# Patient Record
Sex: Female | Born: 1935
Health system: Southern US, Community
[De-identification: ages and names within clinical notes are randomized; demographics above are authoritative.]

## PROBLEM LIST (undated history)

## (undated) DIAGNOSIS — Z9889 Other specified postprocedural states: Secondary | ICD-10-CM

## (undated) DIAGNOSIS — T8859XA Other complications of anesthesia, initial encounter: Secondary | ICD-10-CM

## (undated) DIAGNOSIS — K649 Unspecified hemorrhoids: Secondary | ICD-10-CM

## (undated) DIAGNOSIS — D126 Benign neoplasm of colon, unspecified: Secondary | ICD-10-CM

## (undated) DIAGNOSIS — M353 Polymyalgia rheumatica: Secondary | ICD-10-CM

## (undated) DIAGNOSIS — N3020 Other chronic cystitis without hematuria: Secondary | ICD-10-CM

## (undated) DIAGNOSIS — R112 Nausea with vomiting, unspecified: Secondary | ICD-10-CM

## (undated) DIAGNOSIS — E7800 Pure hypercholesterolemia, unspecified: Secondary | ICD-10-CM

## (undated) DIAGNOSIS — K219 Gastro-esophageal reflux disease without esophagitis: Secondary | ICD-10-CM

## (undated) DIAGNOSIS — T4145XA Adverse effect of unspecified anesthetic, initial encounter: Secondary | ICD-10-CM

## (undated) DIAGNOSIS — C801 Malignant (primary) neoplasm, unspecified: Secondary | ICD-10-CM

## (undated) HISTORY — PX: CHOLECYSTECTOMY: SHX55

## (undated) HISTORY — PX: ABDOMINAL HYSTERECTOMY: SHX81

## (undated) HISTORY — DX: Pure hypercholesterolemia, unspecified: E78.00

## (undated) HISTORY — DX: Benign neoplasm of colon, unspecified: D12.6

## (undated) HISTORY — DX: Other specified postprocedural states: Z98.890

## (undated) HISTORY — PX: WRIST FRACTURE SURGERY: SHX121

## (undated) HISTORY — DX: Gastro-esophageal reflux disease without esophagitis: K21.9

## (undated) HISTORY — DX: Unspecified hemorrhoids: K64.9

---

## 2001-06-24 ENCOUNTER — Emergency Department (HOSPITAL_COMMUNITY): Admission: EM | Admit: 2001-06-24 | Discharge: 2001-06-24 | Payer: Self-pay | Admitting: Emergency Medicine

## 2001-10-17 ENCOUNTER — Encounter: Payer: Self-pay | Admitting: Family Medicine

## 2001-10-17 ENCOUNTER — Ambulatory Visit (HOSPITAL_COMMUNITY): Admission: RE | Admit: 2001-10-17 | Discharge: 2001-10-17 | Payer: Self-pay | Admitting: Family Medicine

## 2001-10-21 ENCOUNTER — Encounter: Payer: Self-pay | Admitting: Family Medicine

## 2001-10-21 ENCOUNTER — Ambulatory Visit (HOSPITAL_COMMUNITY): Admission: RE | Admit: 2001-10-21 | Discharge: 2001-10-21 | Payer: Self-pay | Admitting: Family Medicine

## 2002-04-24 ENCOUNTER — Ambulatory Visit (HOSPITAL_COMMUNITY): Admission: RE | Admit: 2002-04-24 | Discharge: 2002-04-24 | Payer: Self-pay | Admitting: Family Medicine

## 2002-04-24 ENCOUNTER — Encounter: Payer: Self-pay | Admitting: Family Medicine

## 2002-11-12 ENCOUNTER — Encounter: Payer: Self-pay | Admitting: Family Medicine

## 2002-11-12 ENCOUNTER — Ambulatory Visit (HOSPITAL_COMMUNITY): Admission: RE | Admit: 2002-11-12 | Discharge: 2002-11-12 | Payer: Self-pay | Admitting: Family Medicine

## 2003-11-22 ENCOUNTER — Ambulatory Visit (HOSPITAL_COMMUNITY): Admission: RE | Admit: 2003-11-22 | Discharge: 2003-11-22 | Payer: Self-pay | Admitting: Family Medicine

## 2004-06-14 ENCOUNTER — Ambulatory Visit (HOSPITAL_COMMUNITY): Admission: RE | Admit: 2004-06-14 | Discharge: 2004-06-14 | Payer: Self-pay | Admitting: Internal Medicine

## 2004-06-14 ENCOUNTER — Ambulatory Visit: Payer: Self-pay | Admitting: Internal Medicine

## 2004-06-14 DIAGNOSIS — Z9889 Other specified postprocedural states: Secondary | ICD-10-CM

## 2004-06-14 HISTORY — PX: COLONOSCOPY: SHX174

## 2004-06-14 HISTORY — DX: Other specified postprocedural states: Z98.890

## 2004-10-26 ENCOUNTER — Ambulatory Visit (HOSPITAL_COMMUNITY): Admission: RE | Admit: 2004-10-26 | Discharge: 2004-10-26 | Payer: Self-pay | Admitting: Family Medicine

## 2006-06-18 ENCOUNTER — Ambulatory Visit (HOSPITAL_COMMUNITY): Admission: RE | Admit: 2006-06-18 | Discharge: 2006-06-18 | Payer: Self-pay | Admitting: Family Medicine

## 2006-06-25 ENCOUNTER — Ambulatory Visit (HOSPITAL_COMMUNITY): Admission: RE | Admit: 2006-06-25 | Discharge: 2006-06-25 | Payer: Self-pay | Admitting: Family Medicine

## 2006-07-22 ENCOUNTER — Ambulatory Visit: Payer: Self-pay | Admitting: Internal Medicine

## 2006-08-13 ENCOUNTER — Ambulatory Visit (HOSPITAL_COMMUNITY): Admission: RE | Admit: 2006-08-13 | Discharge: 2006-08-13 | Payer: Self-pay | Admitting: Internal Medicine

## 2006-08-13 ENCOUNTER — Encounter (INDEPENDENT_AMBULATORY_CARE_PROVIDER_SITE_OTHER): Payer: Self-pay | Admitting: *Deleted

## 2006-08-13 ENCOUNTER — Ambulatory Visit: Payer: Self-pay | Admitting: Internal Medicine

## 2006-08-13 DIAGNOSIS — Z9889 Other specified postprocedural states: Secondary | ICD-10-CM

## 2006-08-13 HISTORY — DX: Other specified postprocedural states: Z98.890

## 2006-08-13 HISTORY — PX: ESOPHAGOGASTRODUODENOSCOPY: SHX1529

## 2006-09-12 ENCOUNTER — Ambulatory Visit: Payer: Self-pay | Admitting: Internal Medicine

## 2007-07-15 ENCOUNTER — Ambulatory Visit (HOSPITAL_COMMUNITY): Admission: RE | Admit: 2007-07-15 | Discharge: 2007-07-15 | Payer: Self-pay | Admitting: Internal Medicine

## 2007-09-17 ENCOUNTER — Ambulatory Visit: Payer: Self-pay | Admitting: Internal Medicine

## 2008-09-15 ENCOUNTER — Ambulatory Visit (HOSPITAL_COMMUNITY): Admission: RE | Admit: 2008-09-15 | Discharge: 2008-09-15 | Payer: Self-pay | Admitting: Internal Medicine

## 2008-10-28 ENCOUNTER — Encounter (HOSPITAL_COMMUNITY): Admission: RE | Admit: 2008-10-28 | Discharge: 2008-11-27 | Payer: Self-pay | Admitting: Internal Medicine

## 2008-10-28 ENCOUNTER — Ambulatory Visit (HOSPITAL_COMMUNITY): Payer: Self-pay | Admitting: Internal Medicine

## 2009-09-20 ENCOUNTER — Ambulatory Visit (HOSPITAL_COMMUNITY): Admission: RE | Admit: 2009-09-20 | Discharge: 2009-09-20 | Payer: Self-pay | Admitting: Internal Medicine

## 2010-09-19 NOTE — Assessment & Plan Note (Signed)
NAME:  Jeanette, Dougherty                  CHART#:  [id number]   DATE:  09/17/2007                       DOB:  07/23/1934   PRIMARY CARE PHYSICIAN:  Madelin Rear. Sherwood Gambler, MD   CHIEF COMPLAINT:  Annual follow-up/GERD.   PROBLEM LIST:  1. Gastroesophageal reflux disease.  Esophagogastroduodenoscopy with      biopsy on August 13, 2006, by Dr. Jena Gauss:  Pale 1-3-mm nodules      consistent with benign squamous papilloma and a tiny hiatal hernia.  2. Hypercholesterolemia.  3. Status post cholecystectomy.  4. Status post hysterectomy.  5. Diagnostic colonoscopy June 14, 2004:  Friable internal and      external hemorrhoids, left-sided diverticula.  6. CT of abdomen and pelvis with contrast on June 25, 2006:      Hepatic and right renal cystic disease, wall thickening of the      distal gastric antrum into the pylorus, not confirmed on      esophagogastroduodenoscopy.  Sigmoid diverticulosis.   SUBJECTIVE:  [name] reports for follow-up for chronic GERD.  [name] has  previously been on omeprazole 20 mg daily, is now down to 20 mg every  other day and doing quite well.  [name] rarely has heartburn and  indigestion.  [name] denies any anorexia or abdominal pain.  [name]  occasionally may have chronic constipation but has increased the fiber  in her diet and exercises regularly and feels that this is not a problem  at the time.  [name] denies any anorexia or early satiety, dysphagia or  odynophagia.  [name] did have one episode of nausea right after exercising.  [name] became kind of swimmy-headed and diaphoretic.  [name] checked her blood  pressure, which [name] tells me was okay.  This happened last week after  lunch.  [name] has not been seen by her primary care Hebah Bogosian regarding  this.   CURRENT MEDICATIONS:  See the list from Sep 17, 2007.   ALLERGIES:  No known drug allergies.   OBJECTIVE:  VITAL SIGNS:  Weight 167  pounds.  Height 69 inches.  Temperature 98.1, blood pressure 140/80, and pulse 64.  GENERAL:  [name]  is a well-developed, well-nourished, elderly female in no  acute distress.  HEENT:  Sclerae are clear, nonicteric.  Her conjunctivae are pink.  Oropharynx pink and moist without any lesions.  CHEST:  Heart regular rate and rhythm, normal S1 and S2, without  murmurs, rubs, clicks, thrills or gallops.  ABDOMEN:  Positive bowel sounds x4.  No bruits are auscultated.  Soft,  nontender, nondistended, without palpable mass or hepatosplenomegaly.  No rebound tenderness or guarding.  EXTREMITIES:  Without clubbing or edema.   ASSESSMENT:  1. Chronic gastroesophageal reflux disease,well-controlled.  [name] may      be able to use p.r.n. H2 blocker at this point.  2. Single episode of nausea, diaphoresis and dizziness post exercise.      I feel this may be due to hypoglycemia; however, I have asked her      to contact Dr. Sharyon Medicus office right away.  [name] just had a recent      physical exam and will defer any further workup to him.  [name] agrees      with this plan.   PLAN:  1. Colonoscopy 2016.  2. Follow up on a p.r.n.  basis.  3. H2 blockers such as Zantac or Pepcid AC as needed for GERD      symptoms.  If [name] is significant amount of symptoms while on H2      blockers, [name] can resume omeprazole 20 mg daily.  4. [name] will contact Dr. Sherwood Gambler regarding her exercise-induced episode      of diaphoresis, nausea, and dizziness.       Lorenza Burton, N.P.  Electronically Signed     R. Roetta Sessions, M.D.  Electronically Signed    KJ/MEDQ  D:  09/18/2007  T:  09/18/2007  Job:  [id number]   cc:   Madelin Rear. Sherwood Gambler, MD

## 2010-09-19 NOTE — Assessment & Plan Note (Signed)
NAME:  [name], [name]                  CHART#:  [id number]   DATE:  09/12/2006                       DOB:  09-14-1934   Followup right upper quadrant abdominal pain.  EGD August 13, 2006  demonstrated pale esophageal nodules consistent with squamous papilloma,  tiny hiatal hernia, otherwise normal upper GI tract status post  cholecystectomy.  A one-month course of Aciphex 20 mg orally daily has  pretty much abolished her abdominal symptoms.  She is very pleased, she  has asked about anti-reflux measures/dietary control and we talked about  the impact of having a small hiatal hernia (which is minimal).   CURRENT MEDICATIONS:  See updated list.   ALLERGIES:  NO KNOWN DRUG ALLERGIES.   EXAMINATION:  She appears well, weight 169, height 5 foot 9, temperature  98, blood pressure 138/90, pulse 62.  SKIN:  Warm and dry.  CHEST:  Lungs are clear to auscultation.  CARDIO:  Regular rate and rhythm without murmur, gallop, or rub.  ABDOMEN:  Nondistended, positive bowel sounds, soft, nontender, no mass  or splenomegaly.   ASSESSMENT:  Upper abdominal pain, most likely secondary to an element  of gastroesophageal reflux disease/dyspepsia now abolished with acid  suppression therapy (she did have some thickening of the gastric wall on  a prior CT, this was not bourne out on esophagogastroduodenoscopy).  She  is really doing all the right things.  She is exercising, she sees a  Scientific laboratory technician and a Land in [place].  I recommended that she just try  to lose 10 pounds over the remainder of this year and provided her  literature on anti-reflux measures/diet and if she could drop 10 pounds  she could probably decrease Aciphex to every other day and if she cannot  tell the difference with that change then she can probably get along  with p.r.n. use of H2 blockers.  Unless something comes up, plan to see  this nice lady back in one year.       Jonathon Bellows, M.D.  Electronically Signed     RMR/MEDQ  D:  09/12/2006  T:  09/12/2006  Job:  [id number]   cc:   [facility]

## 2010-09-22 ENCOUNTER — Other Ambulatory Visit (HOSPITAL_COMMUNITY): Payer: Self-pay | Admitting: Internal Medicine

## 2010-09-22 DIAGNOSIS — Z139 Encounter for screening, unspecified: Secondary | ICD-10-CM

## 2010-09-22 NOTE — Op Note (Signed)
NAME:  [name], [name]                 ACCOUNT NO.:  [id number]   MEDICAL RECORD NO.:  [id number]          PATIENT TYPE:  AMB   LOCATION:  DAY                           FACILITY:  [facility]   PHYSICIAN:  R. Roetta Sessions, M.D. DATE OF BIRTH:  06-11-1934   DATE OF PROCEDURE:  06/14/2004  DATE OF DISCHARGE:                                 OPERATIVE REPORT   PROCEDURE:  Diagnostic colonoscopy.   INDICATIONS FOR PROCEDURE:  The patient is 76 year old lady with  intermittent hematochezia and constipation which is intermittent as well. No  family history of colorectal neoplasm. She tells me she had a colonoscopy  several years ago without significant findings. Colonoscopy is now being  done. The approach has been discussed with the patient. Potential risks,  benefits, and alternatives have been reviewed. Questions answered. She gets  nauseated with conscious sedation; therefore, we gave her Zofran prior to  the procedure.   PROCEDURE:  O2 saturation, blood pressure, pulse, and respirations were  monitored throughout the entire procedure. Conscious sedation with Versed 4  mg IV and Demerol 75 mg IV in divided doses. Zofran 5 mg IV.   INSTRUMENT:  Olympus video chip system.   FINDINGS:  Digital rectal examination revealed some external hemorrhoids  only.   ENDOSCOPIC FINDINGS:  Prep was adequate.   Rectum:  Examination of the rectal mucosa including retroflexed view of anal  verge revealed only some friable internal hemorrhoids.   Colon:  Colonic mucosa was surveyed from the rectosigmoid junction through  the left, transverse, and right colon to area of the appendiceal orifice,  ileocecal valve, and cecum. These structures were well seen and photographed  for the record. Olympus video scope was slowly withdrawn, and all previously  mentioned mucosal surfaces were again seen. The patient had sigmoid  diverticulum. Remainder of colonic mucosa appeared normal. The patient  tolerated the  procedure well and was reactive to endoscopy.   IMPRESSION:  1.  External and internal hemorrhoids, friable. Otherwise normal rectum.  2.  Sigmoid diverticulum. Remainder of colonic mucosa appeared normal.   RECOMMENDATIONS:  1.  Diverticulosis and hemorrhoid literature provided to Ms. Wallen.  2.  She is to begin a fiber supplement with Metamucil, Citrucel, and      Benefiber.  3.  Ten-day course of Anusol HC suppositories, one per rectum at bedtime.  4.  As far as colorectal cancer screening is concerned, she is to have      another colonoscopy in 10 years.      RMR/MEDQ  D:  06/14/2004  T:  06/14/2004  Job:  [id number]   cc:   Madelin Rear. Sherwood Gambler, MD  [address]  [place]  [place] [address]  Fax: [phone]

## 2010-09-22 NOTE — H&P (Signed)
NAME:  [name], [name]                 ACCOUNT NO.:  [id number]   MEDICAL RECORD NO.:  [id number]          PATIENT TYPE:  AMB   LOCATION:  DAY                           FACILITY:  [facility]   PHYSICIAN:  R. Roetta Sessions, M.D. DATE OF BIRTH:  03/23/1935   DATE OF ADMISSION:  DATE OF DISCHARGE:  LH                              HISTORY & PHYSICAL   REASON FOR CONSULTATION:  Abnormal stomach:  CT.   [name] is a pleasant, 76 year old lady referred through the  courtesy of Dr. Yetta Numbers for further evaluation of abnormal stomach:  CT. Apparently [name] has been having some intermittent right lower  quadrant abdominal pain which she attributes to constipation. Evaluation  at [facility] included a CT of the abdomen and pelvis which  demonstrated question of wall thickening of the distal gastric antrum,  question of inflammatory process versus infiltrating process. EGD was  recommended. Aside from very rare episodes of heartburn, [name]  really does not have any upper GI tract symptoms such as early satiety,  nausea, vomiting upper abdominal pain, odynophagia or dysphagia. She has  had no melena or hematochezia.  Her weight has fluctuated over the year.  She tells me that a year or so ago she weighed as little as 148 but  currently weighs 166.  There is no history of any GI neoplasia. It is  good to know that she had a colonoscopy for intermittent hematochezia  back in June 14, 2004 which demonstrated external and internal  hemorrhoids, otherwise aside from sigmoid diverticula, the rectum and  colon appeared normal.  She does take Fiber One cereal daily but  continues to have a difficult time moving her bowels. She has never had  her upper GI tract evaluated.   PAST MEDICAL HISTORY:  Osteopenia.   PAST SURGERIES:  Cholecystectomy, hysterectomy.   CURRENT MEDICATIONS:  Boniva 1 tablet monthly, multivitamin daily,  glucosamine chondroitin daily, flax seed oil daily.   ALLERGIES:  No known drug allergies.   FAMILY HISTORY:  Mother has a history of thyroid cancer and died with  this at age 56.  Father died in an accident at age 66, otherwise no  history of chronic GI or liver illness.   SOCIAL HISTORY:  The patient is widowed.  She has two sons, she is  retired from [employer] systems [employer] on [address]. She occasionally  consumes a glass of wine. She does not use any tobacco.   REVIEW OF SYSTEMS:  No chest pain, no dyspnea on exertion. Weight gain  is outlined above. Otherwise as in history of present illness.   PHYSICAL EXAMINATION:  GENERAL:  Revealed a pleasant, 76 year old lady  resting comfortably.  VITAL SIGNS:  Weight 166, height 5 feet 8, temperature 97.9, BP 120/70,  pulse 72.  SKIN:  Warm and dry.  HEENT:  No scleral icterus. JVD is not prominent.  CHEST:  Lungs are clear to auscultation.  CARDIAC:  Regular rate and rhythm without murmur, gallop or rub.  BREAST:  Exam is deferred.  ABDOMEN:  Nondistended.  Positive bowel  sounds, soft, minimal right  lower quadrant tenderness to palpation.  No appreciable mass or  organomegaly.  EXTREMITIES:  No edema.   IMPRESSION:  [name] is a pleasant, 76 year old lady with an  abnormal  appearing stomach on CT. She has rare frequent episodes of  reflux,  otherwise she is devoid of any upper GI tract symptoms.  She  have occasional right lower quadrant abdominal pain in the setting of  constipation.   I do not detect any alarm symptoms. More often than not, the report of a  thickened gastric antrum ends up being an artifact.  However, I agree  with the radiologist that we need to directly visualize her stomach  before any conclusions can be drawn.   RECOMMENDATIONS:  I have offered [name] a diagnostic EGD in the very  near future.  The potential risks, benefits and alternatives have been  reviewed. Depending on what is found will make further recommendations  in regards to the  management of her constipation.   I would like to thank Dr. Yetta Numbers for allowing me to see this nice  lady once again.      Jonathon Bellows, M.D.  Electronically Signed     RMR/MEDQ  D:  07/22/2006  T:  07/23/2006  Job:  [id number]   cc:   Kirk Ruths, M.D.  Fax: [phone]

## 2010-09-22 NOTE — Op Note (Signed)
NAME:  [name], WROBLESKI                 ACCOUNT NO.:  [id number]   MEDICAL RECORD NO.:  [id number]          PATIENT TYPE:  AMB   LOCATION:  DAY                           FACILITY:  [facility]   PHYSICIAN:  R. Roetta Sessions, M.D. DATE OF BIRTH:  03/16/1935   DATE OF PROCEDURE:  08/13/2006  DATE OF DISCHARGE:                               OPERATIVE REPORT   PROCEDURE:  Esophagogastroduodenoscopy with biopsy.   INDICATIONS FOR PROCEDURE:  Patient is 76 year old lady with some vague  intermittent epigastric right upper quadrant abdominal pain which she  attributes to constipation.  She had a CT scan which demonstrated some  questionable wall thickening of the stomach.  EGD has been recommended.  She really does not have much in the way of typical heartburn symptoms.  She does take Boniva.  She is not on any acid suppression therapy.  Gallbladder is out.  EGD now being done.  This approach has been  discussed with the patient at length.  Potential risks, benefits and  alternatives have been reviewed, questions answered.  She is agreeable.  Please see documentation in the medical record.   PROCEDURE NOTE:  O2 saturation, blood pressure, pulse respirations  monitored throughout the entire procedure.   CONSCIOUS SEDATION:  Versed 3 mg IV, Demerol 75 mg IV in divided doses.  Cetacaine spray topical pharyngeal anesthesia.   INSTRUMENT:  Pentax video chip system.   FINDINGS:  Examination of esophagus revealed numerous 1-3 mm pale raised  nodules consistent with squamous papillomas, otherwise esophageal mucosa  appeared normal.  EG junction easily traversed.  Stomach:  Gastric cavity was empty.  It insufflated well with air.  Thorough examination of gastric mucosa including retroflex view of the  proximal stomach and esophagogastric junction was undertaken.  The  patient a tiny hiatal hernia.  The stomach insufflated very nicely, the  mucosa was well seen and appeared normal.  Pylorus patent, easily  traversed.  Examination of bulb, second portion revealed no  abnormalities.   THERAPEUTIC/DIAGNOSTIC MANEUVERS PERFORMED:  One of the pale nodules in  the esophagus was biopsied for histologic study.  The patient tolerated  procedure well.   IMPRESSION:  1. 1-3 mm pale nodules, numerous, consistent with benign squamous      papilloma, one biopsied, otherwise normal esophagus.  2. Tiny hiatal hernia, otherwise normal stomach, D1, D2.   RECOMMENDATIONS:  50-month course of AcipHex 20 mg orally daily.  Follow-  up appointment with [place] in the office in one month to how she is doing.  Follow-up on path.  For further recommendations to follow.      Jonathon Bellows, M.D.  Electronically Signed     RMR/MEDQ  D:  08/13/2006  T:  08/13/2006  Job:  [id number]   cc:   Kirk Ruths, M.D.  Fax: [phone]

## 2010-09-26 ENCOUNTER — Ambulatory Visit (HOSPITAL_COMMUNITY)
Admission: RE | Admit: 2010-09-26 | Discharge: 2010-09-26 | Disposition: A | Discharge status: Home / Self Care | Payer: Medicare Other | Source: Ambulatory Visit | Attending: Internal Medicine | Admitting: Internal Medicine

## 2010-09-26 DIAGNOSIS — Z139 Encounter for screening, unspecified: Secondary | ICD-10-CM

## 2010-09-26 DIAGNOSIS — Z1231 Encounter for screening mammogram for malignant neoplasm of breast: Secondary | ICD-10-CM | POA: Insufficient documentation

## 2010-10-18 ENCOUNTER — Other Ambulatory Visit (HOSPITAL_COMMUNITY): Payer: Self-pay | Admitting: Internal Medicine

## 2010-10-18 DIAGNOSIS — Z139 Encounter for screening, unspecified: Secondary | ICD-10-CM

## 2010-10-23 ENCOUNTER — Ambulatory Visit (HOSPITAL_COMMUNITY)
Admission: RE | Admit: 2010-10-23 | Discharge: 2010-10-23 | Disposition: A | Discharge status: Home / Self Care | Payer: Medicare Other | Source: Ambulatory Visit | Attending: Internal Medicine | Admitting: Internal Medicine

## 2010-10-23 DIAGNOSIS — Z780 Asymptomatic menopausal state: Secondary | ICD-10-CM | POA: Insufficient documentation

## 2010-10-23 DIAGNOSIS — Z139 Encounter for screening, unspecified: Secondary | ICD-10-CM

## 2010-10-23 DIAGNOSIS — M818 Other osteoporosis without current pathological fracture: Secondary | ICD-10-CM | POA: Insufficient documentation

## 2010-10-30 ENCOUNTER — Encounter: Payer: Self-pay | Admitting: Gastroenterology

## 2010-10-30 ENCOUNTER — Ambulatory Visit (INDEPENDENT_AMBULATORY_CARE_PROVIDER_SITE_OTHER): Payer: Medicare Other | Admitting: Gastroenterology

## 2010-10-30 VITALS — BP 141/74 | HR 67 | Temp 97.5°F | Ht 67.0 in | Wt 164.4 lb

## 2010-10-30 DIAGNOSIS — R195 Other fecal abnormalities: Secondary | ICD-10-CM

## 2010-10-30 NOTE — Patient Instructions (Signed)
Please have labs completed. We will call you with results.  You may take Miralax every other day as needed for constipation.  Continue a high fiber diet as you are doing!  We have set you up for a colonoscopy; we will discuss further recommendations at that time.

## 2010-10-30 NOTE — Progress Notes (Signed)
Referring Provider: Dwana Melena, MD Primary Care Physician:  Dwana Melena, MD Primary Gastroenterologist:  Dr.  Chief Complaint  Patient presents with  . Rectal Bleeding    heme + card 1 of 3  . Constipation    HPI:  [name] is a 76 y.o. female here as a referral from Dr. Margo Aye for heme + stool.  reports bright red blood per rectum as well, states this is chronic. Hx of internal and external hemorrhoids on colonoscopy in 2006. Has difficulty with constipation, bloating. Feels more bloated than usual. Does report that her right side "sticks out" more than her left side when standing up. Had cholecystectomy in the 1970s, feels scar may be causing it to be different. No abdominal pain. No lack of appetite. No wt loss. No N/V.  Takes a laxative usually on Saturdays. Eating high fiber diet. Labs done with Dr. Margo Aye included: CMP, lipid panel. No significant findings. No CBC on file.   Past Medical History  Diagnosis Date  . GERD (gastroesophageal reflux disease)   . Hypercholesterolemia   . S/P colonoscopy Jun 14, 2004    friable internal and external hemorrhoids, left-sided diverticula  . S/P endoscopy August 13, 2006    pale 1-3 mm nodules consistent with benign squamous papilloma, tiny hiatal hernia    Past Surgical History  Procedure Date  . Cholecystectomy   . Abdominal hysterectomy     Current Outpatient Prescriptions  Medication Sig Dispense Refill  . aspirin 81 MG tablet Take 81 mg by mouth 2 (two) times daily.        . Glucosamine-Chondroit-Vit C-Mn (GLUCOSAMINE 1500 COMPLEX PO) Take by mouth.        . Multiple Vitamin (MULTIVITAMIN) capsule Take 1 capsule by mouth 2 (two) times daily.          Allergies as of 10/30/2010  . (No Known Allergies)    Family History  Problem Relation Age of Onset  . Thyroid cancer Mother   . Colon cancer Neg Hx     History   Social History  . Marital Status: Widowed    Spouse Name: N/A    Number of Children: N/A  . Years of  Education: N/A   Occupational History  . Not on file.   Social History Main Topics  . Smoking status: Never Smoker   . Smokeless tobacco: Not on file  . Alcohol Use: Yes     social  . Drug Use: No  . Sexually Active: Not on file     Review of Systems: Gen: Denies any fever, chills, sweats, anorexia, fatigue, weakness, malaise, weight loss, and sleep disorder CV: Denies chest pain, angina, palpitations, syncope, orthopnea, PND, peripheral edema, and claudication. Resp: Denies dyspnea at rest, dyspnea with exercise, cough, sputum, wheezing, coughing up blood, and pleurisy. GI: Denies vomiting blood, jaundice, and fecal incontinence.   Denies dysphagia or odynophagia. GU : Denies urinary burning, blood in urine, urinary frequency, urinary hesitancy, nocturnal urination, and urinary incontinence. MS: Denies joint pain, limitation of movement, and swelling, stiffness, low back pain, extremity pain. Denies muscle weakness, cramps, atrophy.  Derm: Denies rash, itching, dry skin, hives, moles, warts, or unhealing ulcers.  Psych: Denies depression, anxiety, memory loss, suicidal ideation, hallucinations, paranoia, and confusion. Heme: Denies bruising, bleeding, and enlarged lymph nodes.  Physical Exam: BP 141/74  Pulse 67  Temp(Src) 97.5 F (36.4 C) (Temporal)  Ht 5\' 7"  (1.702 m)  Wt 164 lb 6.4 oz (74.571 kg)  BMI 25.75 kg/m2 General:  Alert,  Well-developed, well-nourished, pleasant and cooperative in NAD Head:  Normocephalic and atraumatic. Eyes:  Sclera clear, no icterus.   Conjunctiva pink. Ears:  Normal auditory acuity. Nose:  No deformity, discharge,  or lesions. Mouth:  No deformity or lesions, dentition normal. Neck:  Supple; no masses or thyromegaly. Lungs:  Clear throughout to auscultation.   No wheezes, crackles, or rhonchi. No acute distress. Heart:  Regular rate and rhythm; no murmurs, clicks, rubs,  or gallops. Abdomen:  Soft, nontender and nondistended. No masses,  hepatosplenomegaly or hernias noted. Normal bowel sounds, without guarding, and without rebound.  Large surgical scar from prior cholecystectomy noted. With pt standing upright, area above surgical scar somewhat more pronounced than left side; this feels like scar tissue. Again, no mass noted. Appears that contour of body is affected by surgical scar. Pt carries most of her weight around the mid-section. Rectal:  Deferred until time of colonoscopy.   Msk:  Symmetrical without gross deformities. Normal posture. Extremities:  Without clubbing or edema. Neurologic:  Alert and  oriented x4;  grossly normal neurologically. Skin:  Intact without significant lesions or rashes. Cervical Nodes:  No significant cervical adenopathy. Psych:  Alert and cooperative. Normal mood and affect.

## 2010-10-30 NOTE — Assessment & Plan Note (Signed)
76 year old pleasant Caucasian female with last colonoscopy in 2006; known internal and external hemorrhoids as well as sigmoid diverticula. Pt reports continued rectal bleeding, as well as reports of heme +stool from Dr. Scharlene Gloss office. More bloating than usual, chronic constipation. She is concerned about the right side of her abdomen, stating it "pooches" out more than the left. This is very mildly pronounced and likely due to scar tissue/scar placement/fat distribution. Denies abdominal pain. Likely hematochezia is due to benign anorectal source; pt does have known hx of hemorrhoids. However, unable to exclude other GI sources; pt with heme + stool via PCP. Will proceed with colonoscopy and updated CBC.  Proceed with TCS with Dr. Jena Gauss in near future: the risks, benefits, and alternatives have been discussed with the patient in detail. The patient states understanding and desires to proceed. CBC Continue high fiber diet Miralax prn

## 2010-10-31 LAB — CBC WITH DIFFERENTIAL/PLATELET
Basophils Absolute: 0 10*3/uL (ref 0.0–0.1)
Basophils Relative: 1 % (ref 0–1)
Eosinophils Absolute: 0.1 10*3/uL (ref 0.0–0.7)
Eosinophils Relative: 2 % (ref 0–5)
HCT: 37 % (ref 36.0–46.0)
Hemoglobin: 12.5 g/dL (ref 12.0–15.0)
Lymphocytes Relative: 39 % (ref 12–46)
Lymphs Abs: 1.5 10*3/uL (ref 0.7–4.0)
MCH: 30.8 pg (ref 26.0–34.0)
MCHC: 33.8 g/dL (ref 30.0–36.0)
MCV: 91.1 fL (ref 78.0–100.0)
Monocytes Absolute: 0.3 10*3/uL (ref 0.1–1.0)
Monocytes Relative: 8 % (ref 3–12)
Neutro Abs: 1.9 10*3/uL (ref 1.7–7.7)
Neutrophils Relative %: 50 % (ref 43–77)
Platelets: 223 10*3/uL (ref 150–400)
RBC: 4.06 MIL/uL (ref 3.87–5.11)
RDW: 13.9 % (ref 11.5–15.5)
WBC: 3.8 10*3/uL — ABNORMAL LOW (ref 4.0–10.5)

## 2010-11-02 ENCOUNTER — Encounter (HOSPITAL_COMMUNITY): Payer: Medicare Other | Attending: Internal Medicine

## 2010-11-02 ENCOUNTER — Encounter: Payer: Self-pay | Admitting: General Practice

## 2010-11-02 DIAGNOSIS — M818 Other osteoporosis without current pathological fracture: Secondary | ICD-10-CM | POA: Insufficient documentation

## 2010-11-03 NOTE — Progress Notes (Signed)
agree

## 2010-11-06 NOTE — Progress Notes (Signed)
Cc to Dr. Hall 

## 2010-11-17 MED ORDER — SODIUM CHLORIDE 0.45 % IV SOLN
Freq: Once | INTRAVENOUS | Status: AC
  Administered 2010-11-20: 11:00:00 via INTRAVENOUS

## 2010-11-20 ENCOUNTER — Encounter: Payer: Medicare Other | Admitting: Internal Medicine

## 2010-11-20 ENCOUNTER — Other Ambulatory Visit: Payer: Self-pay | Admitting: Internal Medicine

## 2010-11-20 ENCOUNTER — Encounter (HOSPITAL_COMMUNITY)
Admission: RE | Disposition: A | Discharge status: Home / Self Care | Payer: Self-pay | Source: Ambulatory Visit | Attending: Internal Medicine

## 2010-11-20 ENCOUNTER — Ambulatory Visit (HOSPITAL_COMMUNITY)
Admission: RE | Admit: 2010-11-20 | Discharge: 2010-11-20 | Disposition: A | Discharge status: Home / Self Care | Payer: Medicare Other | Source: Ambulatory Visit | Attending: Internal Medicine | Admitting: Internal Medicine

## 2010-11-20 ENCOUNTER — Encounter (HOSPITAL_COMMUNITY): Payer: Self-pay | Admitting: *Deleted

## 2010-11-20 DIAGNOSIS — K648 Other hemorrhoids: Secondary | ICD-10-CM | POA: Insufficient documentation

## 2010-11-20 DIAGNOSIS — K921 Melena: Secondary | ICD-10-CM

## 2010-11-20 DIAGNOSIS — K5730 Diverticulosis of large intestine without perforation or abscess without bleeding: Secondary | ICD-10-CM

## 2010-11-20 DIAGNOSIS — E785 Hyperlipidemia, unspecified: Secondary | ICD-10-CM | POA: Insufficient documentation

## 2010-11-20 DIAGNOSIS — K644 Residual hemorrhoidal skin tags: Secondary | ICD-10-CM | POA: Insufficient documentation

## 2010-11-20 DIAGNOSIS — D126 Benign neoplasm of colon, unspecified: Secondary | ICD-10-CM

## 2010-11-20 DIAGNOSIS — Z7982 Long term (current) use of aspirin: Secondary | ICD-10-CM | POA: Insufficient documentation

## 2010-11-20 HISTORY — PX: COLONOSCOPY: SHX5424

## 2010-11-20 SURGERY — COLONOSCOPY
Anesthesia: Moderate Sedation

## 2010-11-20 MED ORDER — MEPERIDINE HCL 100 MG/ML IJ SOLN
INTRAMUSCULAR | Status: AC
  Filled 2010-11-20: qty 1

## 2010-11-20 MED ORDER — STERILE WATER FOR IRRIGATION IR SOLN
Status: DC | PRN
  Administered 2010-11-20: 13:00:00

## 2010-11-20 MED ORDER — MESALAMINE 1000 MG RE SUPP: 1000.0000 mg | Freq: Every day | RECTAL | Status: AC

## 2010-11-20 MED ORDER — MIDAZOLAM HCL 5 MG/5ML IJ SOLN
INTRAMUSCULAR | Status: AC
  Filled 2010-11-20: qty 10

## 2010-11-20 MED ORDER — MIDAZOLAM HCL 5 MG/5ML IJ SOLN
INTRAMUSCULAR | Status: DC | PRN
  Administered 2010-11-20: 1 mg via INTRAVENOUS
  Administered 2010-11-20: 2 mg via INTRAVENOUS
  Administered 2010-11-20: 1 mg via INTRAVENOUS

## 2010-11-20 MED ORDER — MEPERIDINE HCL 100 MG/ML IJ SOLN
INTRAMUSCULAR | Status: DC | PRN
  Administered 2010-11-20: 25 mg via INTRAVENOUS
  Administered 2010-11-20: 50 mg via INTRAVENOUS

## 2010-11-29 ENCOUNTER — Encounter: Payer: Self-pay | Admitting: Internal Medicine

## 2010-11-30 ENCOUNTER — Encounter (HOSPITAL_COMMUNITY): Payer: Self-pay | Admitting: Internal Medicine

## 2011-10-30 ENCOUNTER — Encounter: Payer: Self-pay | Admitting: Internal Medicine

## 2011-10-30 ENCOUNTER — Ambulatory Visit (INDEPENDENT_AMBULATORY_CARE_PROVIDER_SITE_OTHER): Payer: Medicare Other | Admitting: Internal Medicine

## 2011-10-30 VITALS — BP 134/70 | HR 73 | Temp 96.8°F | Ht 67.0 in | Wt 169.8 lb

## 2011-10-30 DIAGNOSIS — K921 Melena: Secondary | ICD-10-CM

## 2011-10-30 NOTE — Patient Instructions (Addendum)
Recommend daily Benefiber 2 tablespoons  Take Miralax 17 grams nightly if no bowel movement on any given day;  FS at the hospital in the near future to evaluate for rectal bleeding

## 2011-10-30 NOTE — Progress Notes (Signed)
Primary Care Physician:  Dwana Melena, MD Primary Gastroenterologist:  Dr. Jena Gauss  Pre-Procedure History & Physical: HPI:  [name] is a 77 y.o. female here for further evaluation of recent hematochezia. States she's been constipated and may go upwards of 4-5 days without a bowel movement and then takes a laxative and has a large bowel movement. Noted 2 episodes of gross blood per rectum with incontinence without any associated abdominal pain. No melena. Hasn't had any bleeding since this episode or prior to it. Did have urinary tract infection for treatment of antibiotics during this time. Prep prior colonoscopy last year was relatively poor. A serrated adenoma was removed from her right colon. History of both external and internal hemorrhoids.  Past Medical History  Diagnosis Date  . GERD (gastroesophageal reflux disease)   . Hypercholesterolemia   . S/P colonoscopy Jun 14, 2004    friable internal and external hemorrhoids, left-sided diverticula  . S/P endoscopy August 13, 2006    pale 1-3 mm nodules consistent with benign squamous papilloma, tiny hiatal hernia  . Hemorrhoids   . Serrated adenoma of colon     Past Surgical History  Procedure Date  . Cholecystectomy   . Abdominal hysterectomy   . Colonoscopy 11/20/2010    Dr. Jena Gauss- serrated adenomaL side diverticulosis, hemorrhoids  . Colonoscopy 06/14/04    friable internal and external hemorrhoids, left-sided diverticula  . Esophagogastroduodenoscopy 08/13/06    pale 1-3 mm nodules consistent with benign squamous papilloma, tiny hiatal hernia    Prior to Admission medications   Medication Sig Start Date End Date Taking? Authorizing Provider  aspirin 81 MG tablet Take 81 mg by mouth daily. Takes two daily   Yes Historical Provider, MD  Multiple Vitamin (MULTIVITAMIN) capsule Take 1 capsule by mouth 2 (two) times daily.     Yes Historical Provider, MD  Glucosamine-Chondroit-Vit C-Mn (GLUCOSAMINE 1500 COMPLEX PO) Take by mouth.       Historical Provider, MD    Allergies as of 10/30/2011  . (No Known Allergies)    Family History  Problem Relation Age of Onset  . Thyroid cancer Mother   . Colon cancer Neg Hx     History   Social History  . Marital Status: Widowed    Spouse Name: N/A    Number of Children: N/A  . Years of Education: N/A   Occupational History  . Not on file.   Social History Main Topics  . Smoking status: Never Smoker   . Smokeless tobacco: Not on file  . Alcohol Use: 1.2 oz/week    2 Glasses of wine per week     Drinks wine 2-3 days per week ( usually a couple of glasses each time)  . Drug Use: No  . Sexually Active: Not on file   Other Topics Concern  . Not on file   Social History Narrative  . No narrative on file    Review of Systems: See HPI, otherwise negative ROS  Physical Exam: BP 134/70  Pulse 73  Temp 96.8 F (36 C) (Tympanic)  Ht 5\' 7"  (1.702 m)  Wt 169 lb 12.8 oz (77.021 kg)  BMI 26.59 kg/m2 General:   Alert,  Well-developed, well-nourished, pleasant and cooperative in NAD Skin:  Intact without significant lesions or rashes. Eyes:  Sclera clear, no icterus.   Conjunctiva pink. Ears:  Normal auditory acuity. Nose:  No deformity, discharge,  or lesions. Mouth:  No deformity or lesions. Neck:  Supple; no masses or thyromegaly. No significant  cervical adenopathy. Lungs:  Clear throughout to auscultation.   No wheezes, crackles, or rhonchi. No acute distress. Heart:  Regular rate and rhythm; no murmurs, clicks, rubs,  or gallops. Abdomen: Non-distended, normal bowel sounds.  Soft and nontender without appreciable mass or hepatosplenomegaly.  Pulses:  Normal pulses noted. Extremities:  Without clubbing or edema. Rectal:  External hemorrhoid tags present. Good sphincter tone. No mass rectal no stool in rectal vault. Mucus Hemoccult-negative.  Impression/Plan:        2 notable episodes of hematochezia recently in the setting of constipation. Almost certainly  related to anorectal origin/hemorrhoids. However, because                   prep was relatively poor previously, I've offered her a sigmoidoscopy just to check the rectum and distal colon and make sure nothing else is going           on.The risks, benefits, limitations, alternatives and imponderables have been reviewed with the patient. Questions have been answered. All parties are           agreeable. I have also recommended she take a fiber supplement such as Benefiber 2 tablespoons every day. In addition, if she has not had a                           reasonably good bowel movement on any given day she should take 17 g of MiraLax at bedtime that night to facilitate bowel function.

## 2011-10-31 ENCOUNTER — Other Ambulatory Visit (HOSPITAL_COMMUNITY): Payer: Self-pay | Admitting: Internal Medicine

## 2011-10-31 DIAGNOSIS — IMO0001 Reserved for inherently not codable concepts without codable children: Secondary | ICD-10-CM

## 2011-11-01 ENCOUNTER — Ambulatory Visit (HOSPITAL_COMMUNITY)
Admission: RE | Admit: 2011-11-01 | Discharge: 2011-11-01 | Disposition: A | Discharge status: Home / Self Care | Payer: Medicare Other | Source: Ambulatory Visit | Attending: Internal Medicine | Admitting: Internal Medicine

## 2011-11-01 DIAGNOSIS — Z1231 Encounter for screening mammogram for malignant neoplasm of breast: Secondary | ICD-10-CM | POA: Insufficient documentation

## 2011-11-01 DIAGNOSIS — IMO0001 Reserved for inherently not codable concepts without codable children: Secondary | ICD-10-CM

## 2011-11-02 ENCOUNTER — Encounter (HOSPITAL_COMMUNITY): Payer: Self-pay | Admitting: Pharmacy Technician

## 2011-11-15 ENCOUNTER — Encounter (HOSPITAL_COMMUNITY): Payer: Self-pay | Admitting: *Deleted

## 2011-11-15 ENCOUNTER — Ambulatory Visit (HOSPITAL_COMMUNITY)
Admission: RE | Admit: 2011-11-15 | Discharge: 2011-11-15 | Disposition: A | Discharge status: Home / Self Care | Payer: Medicare Other | Source: Ambulatory Visit | Attending: Internal Medicine | Admitting: Internal Medicine

## 2011-11-15 ENCOUNTER — Encounter (HOSPITAL_COMMUNITY)
Admission: RE | Disposition: A | Discharge status: Home / Self Care | Payer: Self-pay | Source: Ambulatory Visit | Attending: Internal Medicine

## 2011-11-15 DIAGNOSIS — K921 Melena: Secondary | ICD-10-CM | POA: Insufficient documentation

## 2011-11-15 DIAGNOSIS — K5730 Diverticulosis of large intestine without perforation or abscess without bleeding: Secondary | ICD-10-CM

## 2011-11-15 DIAGNOSIS — D129 Benign neoplasm of anus and anal canal: Secondary | ICD-10-CM | POA: Insufficient documentation

## 2011-11-15 DIAGNOSIS — D128 Benign neoplasm of rectum: Secondary | ICD-10-CM | POA: Insufficient documentation

## 2011-11-15 DIAGNOSIS — K648 Other hemorrhoids: Secondary | ICD-10-CM

## 2011-11-15 DIAGNOSIS — K621 Rectal polyp: Secondary | ICD-10-CM

## 2011-11-15 DIAGNOSIS — E7800 Pure hypercholesterolemia, unspecified: Secondary | ICD-10-CM | POA: Insufficient documentation

## 2011-11-15 DIAGNOSIS — K644 Residual hemorrhoidal skin tags: Secondary | ICD-10-CM | POA: Insufficient documentation

## 2011-11-15 DIAGNOSIS — K620 Anal polyp: Secondary | ICD-10-CM

## 2011-11-15 HISTORY — PX: FLEXIBLE SIGMOIDOSCOPY: SHX5431

## 2011-11-15 SURGERY — SIGMOIDOSCOPY, FLEXIBLE
Anesthesia: Moderate Sedation

## 2011-11-15 MED ORDER — MEPERIDINE HCL 100 MG/ML IJ SOLN
INTRAMUSCULAR | Status: AC
  Filled 2011-11-15: qty 1

## 2011-11-15 MED ORDER — MIDAZOLAM HCL 5 MG/5ML IJ SOLN
INTRAMUSCULAR | Status: AC
  Filled 2011-11-15: qty 5

## 2011-11-15 NOTE — H&P (View-Only) (Signed)
Primary Care Physician:  HALL,ZACK, MD Primary Gastroenterologist:  Dr. Jodi Criscuolo  Pre-Procedure History & Physical: HPI:  [name] is a 77 y.o. female here for further evaluation of recent hematochezia. States she's been constipated and may go upwards of 4-5 days without a bowel movement and then takes a laxative and has a large bowel movement. Noted 2 episodes of gross blood per rectum with incontinence without any associated abdominal pain. No melena. Hasn't had any bleeding since this episode or prior to it. Did have urinary tract infection for treatment of antibiotics during this time. Prep prior colonoscopy last year was relatively poor. A serrated adenoma was removed from her right colon. History of both external and internal hemorrhoids.  Past Medical History  Diagnosis Date  . GERD (gastroesophageal reflux disease)   . Hypercholesterolemia   . S/P colonoscopy Jun 14, 2004    friable internal and external hemorrhoids, left-sided diverticula  . S/P endoscopy August 13, 2006    pale 1-3 mm nodules consistent with benign squamous papilloma, tiny hiatal hernia  . Hemorrhoids   . Serrated adenoma of colon     Past Surgical History  Procedure Date  . Cholecystectomy   . Abdominal hysterectomy   . Colonoscopy 11/20/2010    Dr. Evert Wenrich- serrated adenomaL side diverticulosis, hemorrhoids  . Colonoscopy 06/14/04    friable internal and external hemorrhoids, left-sided diverticula  . Esophagogastroduodenoscopy 08/13/06    pale 1-3 mm nodules consistent with benign squamous papilloma, tiny hiatal hernia    Prior to Admission medications   Medication Sig Start Date End Date Taking? Authorizing Provider  aspirin 81 MG tablet Take 81 mg by mouth daily. Takes two daily   Yes Historical Provider, MD  Multiple Vitamin (MULTIVITAMIN) capsule Take 1 capsule by mouth 2 (two) times daily.     Yes Historical Provider, MD  Glucosamine-Chondroit-Vit C-Mn (GLUCOSAMINE 1500 COMPLEX PO) Take by mouth.       Historical Provider, MD    Allergies as of 10/30/2011  . (No Known Allergies)    Family History  Problem Relation Age of Onset  . Thyroid cancer Mother   . Colon cancer Neg Hx     History   Social History  . Marital Status: Widowed    Spouse Name: N/A    Number of Children: N/A  . Years of Education: N/A   Occupational History  . Not on file.   Social History Main Topics  . Smoking status: Never Smoker   . Smokeless tobacco: Not on file  . Alcohol Use: 1.2 oz/week    2 Glasses of wine per week     Drinks wine 2-3 days per week ( usually a couple of glasses each time)  . Drug Use: No  . Sexually Active: Not on file   Other Topics Concern  . Not on file   Social History Narrative  . No narrative on file    Review of Systems: See HPI, otherwise negative ROS  Physical Exam: BP 134/70  Pulse 73  Temp 96.8 F (36 C) (Tympanic)  Ht 5' 7" (1.702 m)  Wt 169 lb 12.8 oz (77.021 kg)  BMI 26.59 kg/m2 General:   Alert,  Well-developed, well-nourished, pleasant and cooperative in NAD Skin:  Intact without significant lesions or rashes. Eyes:  Sclera clear, no icterus.   Conjunctiva pink. Ears:  Normal auditory acuity. Nose:  No deformity, discharge,  or lesions. Mouth:  No deformity or lesions. Neck:  Supple; no masses or thyromegaly. No significant   cervical adenopathy. Lungs:  Clear throughout to auscultation.   No wheezes, crackles, or rhonchi. No acute distress. Heart:  Regular rate and rhythm; no murmurs, clicks, rubs,  or gallops. Abdomen: Non-distended, normal bowel sounds.  Soft and nontender without appreciable mass or hepatosplenomegaly.  Pulses:  Normal pulses noted. Extremities:  Without clubbing or edema. Rectal:  External hemorrhoid tags present. Good sphincter tone. No mass rectal no stool in rectal vault. Mucus Hemoccult-negative.  Impression/Plan:        2 notable episodes of hematochezia recently in the setting of constipation. Almost certainly  related to anorectal origin/hemorrhoids. However, because                   prep was relatively poor previously, I've offered her a sigmoidoscopy just to check the rectum and distal colon and make sure nothing else is going           on.The risks, benefits, limitations, alternatives and imponderables have been reviewed with the patient. Questions have been answered. All parties are           agreeable. I have also recommended she take a fiber supplement such as Benefiber 2 tablespoons every day. In addition, if she has not had a                           reasonably good bowel movement on any given day she should take 17 g of MiraLax at bedtime that night to facilitate bowel function.        

## 2011-11-15 NOTE — Interval H&P Note (Signed)
History and Physical Interval Note:  11/15/2011 12:31 PM  [name]  has presented today for surgery, with the diagnosis of HEMATOCHEZIA  The various methods of treatment have been discussed with the patient and family. After consideration of risks, benefits and other options for treatment, the patient has consented to  Procedure(s) (LRB): FLEXIBLE SIGMOIDOSCOPY (N/A) as a surgical intervention .  The patient's history has been reviewed, patient examined, no change in status, stable for surgery.  I have reviewed the patients' chart and labs.  Questions were answered to the patient's satisfaction.     Eula Listen

## 2011-11-15 NOTE — Op Note (Signed)
[facility] [address] [place], [place]  [address]  FLEXIBLE SIGMOIDOSCOPY PROCEDURE REPORT  PATIENT:  Jeanette, Dougherty  MR#:  [id number] BIRTHDATE:  1935-02-28, 76 yrs. old  GENDER:  female  ENDOSCOPIST:  R. Roetta Sessions, MD Caleen Essex Referred by:  Catalina Pizza, M.D.  PROCEDURE DATE:  11/15/2011 PROCEDURE:  Sigmoidoscopy with biopsy ASA CLASS:  2 INDICATIONS:   hematochezia; adenoma removed: Last year. Has known hemorrhoids.  MEDICATIONS:  None  DESCRIPTION OF PROCEDURE:   After the risks benefits and alternatives of the procedure were thoroughly explained, informed consent was obtained. A rectal exam performed. The EC-[id number] ([id number]) endoscope was introduced through the anus and advanced to the , without limitations.  to 40 cm in a nice one to one fashion. The quality of the prep was  Adequate . The instrument was then slowly withdrawn as the mucosa was fully examined. <<PROCEDUREIMAGES>>  Findings: Sigmoid diverticula; mild external and  anal canal hemorrhoids. Couple tiny mamillation seen the rectal mucosa. One 4 mm polyp in the rectum at 5 cm.  The rectal polyp was cold biopsied/removed.  COMPLICATIONS:  None  ENDOSCOPIC IMPRESSION: Hemorrhoids-likely source of rectal bleeding. Single diminutive polyp-removed as described above. Colonic diverticulosis.  RECOMMENDATIONS:  Daily fiber Benefiber 2 tablespoons; MiraLax 70 g orally daily at bedtime as needed if noted good bowel movement on any given day.  Followup on pathology.  ______________________________ R. Roetta Sessions, MD Caleen Essex  CC:  n. eSIGNED:   R. Roetta Sessions at 11/15/2011 12:52 PM  Meade Maw, [phone]

## 2011-11-18 ENCOUNTER — Encounter: Payer: Self-pay | Admitting: Internal Medicine

## 2011-11-19 ENCOUNTER — Encounter: Payer: Self-pay | Admitting: *Deleted

## 2011-11-19 ENCOUNTER — Encounter (HOSPITAL_COMMUNITY): Payer: Self-pay | Admitting: Internal Medicine

## 2012-11-20 ENCOUNTER — Other Ambulatory Visit (HOSPITAL_COMMUNITY): Payer: Self-pay | Admitting: Internal Medicine

## 2012-11-20 DIAGNOSIS — Z139 Encounter for screening, unspecified: Secondary | ICD-10-CM

## 2012-12-02 ENCOUNTER — Ambulatory Visit (HOSPITAL_COMMUNITY)
Admission: RE | Admit: 2012-12-02 | Discharge: 2012-12-02 | Disposition: A | Discharge status: Home / Self Care | Payer: Medicare Other | Source: Ambulatory Visit | Attending: Internal Medicine | Admitting: Internal Medicine

## 2012-12-02 DIAGNOSIS — Z1231 Encounter for screening mammogram for malignant neoplasm of breast: Secondary | ICD-10-CM | POA: Insufficient documentation

## 2012-12-02 DIAGNOSIS — Z139 Encounter for screening, unspecified: Secondary | ICD-10-CM

## 2013-01-20 ENCOUNTER — Encounter (HOSPITAL_COMMUNITY)
Admission: RE | Admit: 2013-01-20 | Discharge: 2013-01-20 | Disposition: A | Discharge status: Home / Self Care | Payer: Medicare Other | Source: Ambulatory Visit | Attending: Internal Medicine | Admitting: Internal Medicine

## 2013-01-20 DIAGNOSIS — K921 Melena: Secondary | ICD-10-CM | POA: Insufficient documentation

## 2013-01-20 LAB — BASIC METABOLIC PANEL
BUN: 12 mg/dL (ref 6–23)
CO2: 25 mEq/L (ref 19–32)
Calcium: 9 mg/dL (ref 8.4–10.5)
Chloride: 105 mEq/L (ref 96–112)
Creatinine, Ser: 0.77 mg/dL (ref 0.50–1.10)
GFR calc Af Amer: >90 mL/min (ref >90)
GFR calc non Af Amer: 80 mL/min — ABNORMAL LOW (ref >90)
Glucose, Bld: 98 mg/dL (ref 70–99)
Potassium: 3.7 mEq/L (ref 3.5–5.1)
Sodium: 140 mEq/L (ref 135–145)

## 2013-01-20 MED ORDER — ZOLEDRONIC ACID 5 MG/100ML IV SOLN
5.0000 mg | Freq: Once | INTRAVENOUS | Status: AC
  Administered 2013-01-20: 5 mg via INTRAVENOUS
  Filled 2013-01-20: qty 100

## 2013-01-20 NOTE — Progress Notes (Signed)
                  Results Basic metabolic panel (Order [id number])         Entry Date    01/20/2013      Component Results    Component Value Range & Units Status   Sodium 140 135 - 145 mEq/L Final   Potassium 3.7 3.5 - 5.1 mEq/L Final   Chloride 105 96 - 112 mEq/L Final   CO2 25 19 - 32 mEq/L Final   Glucose, Bld 98 70 - 99 mg/dL Final   BUN 12 6 - 23 mg/dL Final   Creatinine, Ser 0.77 0.50 - 1.10 mg/dL Final   Calcium 9.0 8.4 - 10.5 mg/dL Final   GFR calc non Af Amer 80 (L) >90 mL/min Final   GFR calc Af Amer >90 >90 mL/min Final   (NOTE) The eGFR has been calculated using the CKD EPI equation. This calculation has not been validated in all clinical situations. eGFR's persistently <90 mL/min signify possible Chronic Kidney Disease.                               Result Information        Abnormal    Status: Final result (01/20/2013 10:12 AM)    Provider Status: Ordered  Encounter     View Encounter           Lab Information    [facility]                                 Order-Level Documents:    There are no order-level documents.                  Basic metabolic panel (Order [id number]) Released By: Kristine Royal Date: 01/20/2013  Lab Authorizing: Catalina Pizza, MD Department: [facility]  Order: [id number]              Order Information    Order Date/Time Release Date/Time Start Date/Time End Date/Time   01/20/13 08:54 AM 01/20/13 08:54 AM 01/20/13 08:55 AM 01/20/13 08:55 AM               Order Details    Frequency Duration Priority Order Class   Once 1 occurrence Routine Lab Collect          Acc#   [id number]               Order History Inpatient    Date/Time Action Taken User Additional Information   01/20/13 0854 Release Herminio Heads, RN From Order: [id number]   01/20/13 0952 Result Lab In [facility] Interface In process   01/20/13 1012 Result  Lab In [facility] Final         Order Requisition    Basic metabolic panel (Order [id number]) on 01/20/13            Collection Information    Collected: 01/20/2013 9:30 AM Resulting Agency: [facility]            Additional Information    Associated Reports   View Encounter   Priority and Order Details   Collection Information.         View SmartLink Info    Basic metabolic panel (Order [id number]) on 01/20/13

## 2013-01-20 NOTE — Progress Notes (Signed)
Arrived for Reclast infusion. Iv started left ac #22 venocath. Med given per pump at 200 ml/hr. Tolerated well. To return in 1 yr. bmet drawn prior to infusion.

## 2013-03-24 ENCOUNTER — Other Ambulatory Visit (HOSPITAL_COMMUNITY): Payer: Self-pay | Admitting: Internal Medicine

## 2013-03-24 DIAGNOSIS — E049 Nontoxic goiter, unspecified: Secondary | ICD-10-CM

## 2013-03-26 ENCOUNTER — Ambulatory Visit (HOSPITAL_COMMUNITY)
Admission: RE | Admit: 2013-03-26 | Discharge: 2013-03-26 | Disposition: A | Discharge status: Home / Self Care | Payer: Medicare Other | Source: Ambulatory Visit | Attending: Internal Medicine | Admitting: Internal Medicine

## 2013-03-26 DIAGNOSIS — E049 Nontoxic goiter, unspecified: Secondary | ICD-10-CM

## 2013-03-26 DIAGNOSIS — E041 Nontoxic single thyroid nodule: Secondary | ICD-10-CM | POA: Insufficient documentation

## 2013-04-08 ENCOUNTER — Other Ambulatory Visit (HOSPITAL_COMMUNITY): Payer: Self-pay | Admitting: Internal Medicine

## 2013-04-08 DIAGNOSIS — E041 Nontoxic single thyroid nodule: Secondary | ICD-10-CM

## 2013-04-14 ENCOUNTER — Ambulatory Visit (HOSPITAL_COMMUNITY)
Admission: RE | Admit: 2013-04-14 | Discharge: 2013-04-14 | Disposition: A | Discharge status: Home / Self Care | Payer: Medicare Other | Source: Ambulatory Visit | Attending: Internal Medicine | Admitting: Internal Medicine

## 2013-04-14 ENCOUNTER — Encounter (HOSPITAL_COMMUNITY): Payer: Self-pay

## 2013-04-14 ENCOUNTER — Other Ambulatory Visit (HOSPITAL_COMMUNITY): Payer: Self-pay | Admitting: Internal Medicine

## 2013-04-14 VITALS — BP 138/81 | HR 82 | Temp 97.9°F | Resp 16

## 2013-04-14 DIAGNOSIS — E041 Nontoxic single thyroid nodule: Secondary | ICD-10-CM | POA: Insufficient documentation

## 2013-04-14 DIAGNOSIS — Z808 Family history of malignant neoplasm of other organs or systems: Secondary | ICD-10-CM | POA: Insufficient documentation

## 2013-04-14 MED ORDER — LIDOCAINE HCL (PF) 2 % IJ SOLN
10.0000 mL | Freq: Once | INTRAMUSCULAR | Status: AC
  Administered 2013-04-14: 10 mL

## 2013-04-14 MED ORDER — LIDOCAINE HCL (PF) 2 % IJ SOLN
INTRAMUSCULAR | Status: AC
  Administered 2013-04-14: 10 mL
  Filled 2013-04-14: qty 10

## 2013-04-14 NOTE — Procedures (Signed)
PreOperative Dx: Left thyroid nodule Postoperative Dx: Left thyroid nodule Procedure:   US guided FNA of left thyroid nodule Radiologist:  Tyron Russell Anesthesia:  2 ml of 2 lidocaine Specimen:  FNA x 4  EBL:   < 1 ml Complications: None

## 2013-06-03 ENCOUNTER — Other Ambulatory Visit: Payer: Self-pay | Admitting: Internal Medicine

## 2013-06-03 DIAGNOSIS — E041 Nontoxic single thyroid nodule: Secondary | ICD-10-CM

## 2013-06-09 ENCOUNTER — Other Ambulatory Visit (HOSPITAL_COMMUNITY)
Admission: RE | Admit: 2013-06-09 | Discharge: 2013-06-09 | Disposition: A | Discharge status: Home / Self Care | Payer: Medicare Other | Source: Ambulatory Visit | Attending: Interventional Radiology | Admitting: Interventional Radiology

## 2013-06-09 ENCOUNTER — Ambulatory Visit
Admission: RE | Admit: 2013-06-09 | Discharge: 2013-06-09 | Disposition: A | Discharge status: Home / Self Care | Payer: Medicare Other | Source: Ambulatory Visit | Attending: Internal Medicine | Admitting: Internal Medicine

## 2013-06-09 DIAGNOSIS — E049 Nontoxic goiter, unspecified: Secondary | ICD-10-CM | POA: Insufficient documentation

## 2013-06-09 DIAGNOSIS — E041 Nontoxic single thyroid nodule: Secondary | ICD-10-CM

## 2013-07-27 ENCOUNTER — Emergency Department (HOSPITAL_COMMUNITY): Payer: Medicare Other

## 2013-07-27 ENCOUNTER — Encounter (HOSPITAL_COMMUNITY): Payer: Self-pay | Admitting: Emergency Medicine

## 2013-07-27 ENCOUNTER — Emergency Department (HOSPITAL_COMMUNITY)
Admission: EM | Admit: 2013-07-27 | Discharge: 2013-07-27 | Disposition: A | Discharge status: Home / Self Care | Payer: Medicare Other | Attending: Emergency Medicine | Admitting: Emergency Medicine

## 2013-07-27 DIAGNOSIS — M353 Polymyalgia rheumatica: Secondary | ICD-10-CM | POA: Insufficient documentation

## 2013-07-27 DIAGNOSIS — K219 Gastro-esophageal reflux disease without esophagitis: Secondary | ICD-10-CM | POA: Insufficient documentation

## 2013-07-27 DIAGNOSIS — Z7982 Long term (current) use of aspirin: Secondary | ICD-10-CM | POA: Insufficient documentation

## 2013-07-27 DIAGNOSIS — Z8639 Personal history of other endocrine, nutritional and metabolic disease: Secondary | ICD-10-CM | POA: Insufficient documentation

## 2013-07-27 DIAGNOSIS — S99929A Unspecified injury of unspecified foot, initial encounter: Principal | ICD-10-CM

## 2013-07-27 DIAGNOSIS — X500XXA Overexertion from strenuous movement or load, initial encounter: Secondary | ICD-10-CM | POA: Insufficient documentation

## 2013-07-27 DIAGNOSIS — S8990XA Unspecified injury of unspecified lower leg, initial encounter: Secondary | ICD-10-CM | POA: Insufficient documentation

## 2013-07-27 DIAGNOSIS — Y929 Unspecified place or not applicable: Secondary | ICD-10-CM | POA: Insufficient documentation

## 2013-07-27 DIAGNOSIS — Z862 Personal history of diseases of the blood and blood-forming organs and certain disorders involving the immune mechanism: Secondary | ICD-10-CM | POA: Insufficient documentation

## 2013-07-27 DIAGNOSIS — Z8679 Personal history of other diseases of the circulatory system: Secondary | ICD-10-CM | POA: Insufficient documentation

## 2013-07-27 DIAGNOSIS — Y9301 Activity, walking, marching and hiking: Secondary | ICD-10-CM | POA: Insufficient documentation

## 2013-07-27 DIAGNOSIS — S99919A Unspecified injury of unspecified ankle, initial encounter: Secondary | ICD-10-CM | POA: Insufficient documentation

## 2013-07-27 DIAGNOSIS — M79672 Pain in left foot: Secondary | ICD-10-CM

## 2013-07-27 HISTORY — DX: Polymyalgia rheumatica: M35.3

## 2013-07-27 MED ORDER — HYDROCODONE-ACETAMINOPHEN 5-325 MG PO TABS: 2.0000 | ORAL_TABLET | Freq: Four times a day (QID) | ORAL | Status: DC | PRN

## 2013-07-27 NOTE — ED Provider Notes (Signed)
CSN: [id number]     Arrival date & time 07/27/13  1324 History   First MD Initiated Contact with Patient 07/27/13 1526     Chief Complaint  Patient presents with  . Foot Pain     (Consider location/radiation/quality/duration/timing/severity/associated sxs/prior Treatment) The history is provided by the patient. No language interpreter was used.    Past Medical History  Diagnosis Date  . GERD (gastroesophageal reflux disease)   . Hypercholesterolemia   . S/P colonoscopy Jun 14, 2004    friable internal and external hemorrhoids, left-sided diverticula  . S/P endoscopy August 13, 2006    pale 1-3 mm nodules consistent with benign squamous papilloma, tiny hiatal hernia  . Hemorrhoids   . Serrated adenoma of colon   . Polymyalgia rheumatica    Past Surgical History  Procedure Laterality Date  . Cholecystectomy    . Abdominal hysterectomy    . Colonoscopy  11/20/2010    Dr. Gala Romney- serrated adenomaL side diverticulosis, hemorrhoids  . Colonoscopy  06/14/04    friable internal and external hemorrhoids, left-sided diverticula  . Esophagogastroduodenoscopy  08/13/06    pale 1-3 mm nodules consistent with benign squamous papilloma, tiny hiatal hernia  . Flexible sigmoidoscopy  11/15/2011    Procedure: FLEXIBLE SIGMOIDOSCOPY;  Surgeon: Daneil Dolin, MD;  Location: [facility];  Service: Endoscopy;  Laterality: N/A;  12:30PM   Family History  Problem Relation Age of Onset  . Thyroid cancer Mother   . Colon cancer Neg Hx    History  Substance Use Topics  . Smoking status: Never Smoker   . Smokeless tobacco: Not on file  . Alcohol Use: 1.2 oz/week    2 Glasses of wine per week     Comment: Drinks wine 2-3 days per week ( usually a couple of glasses each time)   OB History   Grav Para Term Preterm Abortions TAB SAB Ect Mult Living                 Review of Systems  Genitourinary: Negative for dysuria.  Musculoskeletal: Positive for arthralgias. Negative for back pain, gait  problem, neck pain and neck stiffness.  All other systems reviewed and are negative.      Allergies  Review of patient's allergies indicates no known allergies.  Home Medications   Current Outpatient Rx  Name  Route  Sig  Dispense  Refill  . aspirin EC 81 MG tablet   Oral   Take 162 mg by mouth daily.         Marland Kitchen ibuprofen (ADVIL,MOTRIN) 200 MG tablet   Oral   Take 400 mg by mouth every 6 (six) hours as needed. For pain         . Multiple Vitamin (MULTIVITAMIN) capsule   Oral   Take 1 capsule by mouth 2 (two) times daily.           . naproxen sodium (ANAPROX) 220 MG tablet   Oral   Take 220 mg by mouth every 8 (eight) hours as needed. For pain          BP 142/58  Pulse 103  Temp(Src) 97.9 F (36.6 C) (Oral)  Resp 14  SpO2 96% Physical Exam  Nursing note and vitals reviewed. Constitutional: She is oriented to person, place, and time. She appears well-developed and well-nourished.  HENT:  Head: Normocephalic and atraumatic.  Eyes: Conjunctivae and EOM are normal.  Neck: Normal range of motion. Neck supple.  Cardiovascular: Normal rate, regular rhythm and  normal heart sounds.   Pulmonary/Chest: Effort normal and breath sounds normal. No respiratory distress. She has no wheezes.  Musculoskeletal: Normal range of motion.  Good ROM of feet and ankles bilaterally. No numbness or tingling. No difficulty walking. No obvious deformities.  Neurological: She is alert and oriented to person, place, and time.  Skin: Skin is warm and dry.  Psychiatric: She has a normal mood and affect. Her behavior is normal. Judgment and thought content normal.    ED Course  Procedures (including critical care time) Labs Review Labs Reviewed - No data to display Imaging Review Dg Foot Complete Left  07/27/2013   CLINICAL DATA:  Foot pain.  No recent injury.  EXAM: LEFT FOOT - COMPLETE 3+ VIEW  COMPARISON:  None.  FINDINGS: There is no evidence of fracture or dislocation. Small  calcaneal spur noted. Slight degenerative changes of the midfoot. Soft tissues are unremarkable.  IMPRESSION: No acute bony abnormality.   Electronically Signed   By: Curlene Dolphin M.D.   On: 07/27/2013 14:06     EKG Interpretation None      MDM   Final diagnoses:  Left foot pain    Left foot x-ray; negative. Ace wrap applied for support. RICE instructions given. Prescription for a few hydrocodone given. Advised to take ibuprofen and use hydrocodone for mod-severe pain if needed.      Elisha Headland, NP 07/31/13 1525

## 2013-07-27 NOTE — ED Notes (Signed)
Pt with left foot pain with walking after pt had stepped into a hole recently, pt states she injured same foot after miss stepping on a stepping stone this past Dec.

## 2013-07-27 NOTE — Discharge Instructions (Signed)
Foot Contusion A foot contusion is a deep bruise to the foot. Contusions are the result of an injury that caused bleeding under the skin. The contusion may turn blue, purple, or yellow. Minor injuries will give you a painless contusion, but more severe contusions may stay painful and swollen for a few weeks. CAUSES  A foot contusion comes from a direct blow to that area, such as a heavy object falling on the foot. SYMPTOMS   Swelling of the foot.  Discoloration of the foot.  Tenderness or soreness of the foot. DIAGNOSIS  You will have a physical exam and will be asked about your history. You may need an X-ray of your foot to look for a broken bone (fracture).  TREATMENT  An elastic wrap may be recommended to support your foot. Resting, elevating, and applying cold compresses to your foot are often the best treatments for a foot contusion. Over-the-counter medicines may also be recommended for pain control. HOME CARE INSTRUCTIONS   Put ice on the injured area.  Put ice in a plastic bag.  Place a towel between your skin and the bag.  Leave the ice on for 15-20 minutes, 03-04 times a day.  Only take over-the-counter or prescription medicines for pain, discomfort, or fever as directed by your caregiver.  If told, use an elastic wrap as directed. This can help reduce swelling. You may remove the wrap for sleeping, showering, and bathing. If your toes become numb, cold, or blue, take the wrap off and reapply it more loosely.  Elevate your foot with pillows to reduce swelling.  Try to avoid standing or walking while the foot is painful. Do not resume use until instructed by your caregiver. Then, begin use gradually. If pain develops, decrease use. Gradually increase activities that do not cause discomfort until you have normal use of your foot.  See your caregiver as directed. It is very important to keep all follow-up appointments in order to avoid any lasting problems with your foot,  including long-term (chronic) pain. SEEK IMMEDIATE MEDICAL CARE IF:   You have increased redness, swelling, or pain in your foot.  Your swelling or pain is not relieved with medicines.  You have loss of feeling in your foot or are unable to move your toes.  Your foot turns cold or blue.  You have pain when you move your toes.  Your foot becomes warm to the touch.  Your contusion does not improve in 2 days. MAKE SURE YOU:   Understand these instructions.  Will watch your condition.  Will get help right away if you are not doing well or get worse. Document Released: 02/12/2006 Document Revised: 10/23/2011 Document Reviewed: 03/27/2011 [facility] Patient Information 2014 [place], [place].  RICE: Routine Care for Injuries The routine care of many injuries includes Rest, Ice, Compression, and Elevation (RICE). HOME CARE INSTRUCTIONS  Rest is needed to allow your body to heal. Routine activities can usually be resumed when comfortable. Injured tendons and bones can take up to 6 weeks to heal. Tendons are the cord-like structures that attach muscle to bone.  Ice following an injury helps keep the swelling down and reduces pain.  Put ice in a plastic bag.  Place a towel between your skin and the bag.  Leave the ice on for 15-20 minutes, 03-04 times a day. Do this while awake, for the first 24 to 48 hours. After that, continue as directed by your caregiver.  Compression helps keep swelling down. It also gives support  and helps with discomfort. If an elastic bandage has been applied, it should be removed and reapplied every 3 to 4 hours. It should not be applied tightly, but firmly enough to keep swelling down. Watch fingers or toes for swelling, bluish discoloration, coldness, numbness, or excessive pain. If any of these problems occur, remove the bandage and reapply loosely. Contact your caregiver if these problems continue.  Elevation helps reduce swelling and decreases pain. With  extremities, such as the arms, hands, legs, and feet, the injured area should be placed near or above the level of the heart, if possible. SEEK IMMEDIATE MEDICAL CARE IF:  You have persistent pain and swelling.  You develop redness, numbness, or unexpected weakness.  Your symptoms are getting worse rather than improving after several days. These symptoms may indicate that further evaluation or further X-rays are needed. Sometimes, X-rays may not show a small broken bone (fracture) until 1 week or 10 days later. Make a follow-up appointment with your caregiver. Ask when your X-ray results will be ready. Make sure you get your X-ray results. Document Released: 08/05/2000 Document Revised: 07/16/2011 Document Reviewed: 09/22/2010 [facility] Patient Information 2014 [place], [place].  Elevate and ice packs Wear ace wrap for support Hydrocodone as prescribed for mod-severe pain

## 2013-07-27 NOTE — ED Notes (Signed)
Pt c/o foot pain after steeping in hole on Saturday.

## 2013-08-03 NOTE — ED Provider Notes (Signed)
Medical screening examination/treatment/procedure(s) were performed by non-physician practitioner and as supervising physician I was immediately available for consultation/collaboration.   EKG Interpretation None        Mervin Kung, MD 08/03/13 [phone]

## 2013-09-13 ENCOUNTER — Emergency Department: Payer: Self-pay | Admitting: Emergency Medicine

## 2013-09-13 LAB — CBC WITH DIFFERENTIAL/PLATELET
Basophil #: 0 10*3/uL (ref 0.0–0.1)
Basophil %: 0.7 %
Eosinophil #: 0 10*3/uL (ref 0.0–0.7)
Eosinophil %: 0.5 %
HCT: 41.3 % (ref 35.0–47.0)
HGB: 13.8 g/dL (ref 12.0–16.0)
Lymphocyte #: 0.8 10*3/uL — ABNORMAL LOW (ref 1.0–3.6)
Lymphocyte %: 11.6 %
MCH: 31.1 pg (ref 26.0–34.0)
MCHC: 33.3 g/dL (ref 32.0–36.0)
MCV: 93 fL (ref 80–100)
Monocyte #: 0.4 x10 3/mm (ref 0.2–0.9)
Monocyte %: 6.3 %
Neutrophil #: 5.5 10*3/uL (ref 1.4–6.5)
Neutrophil %: 80.9 %
Platelet: 231 10*3/uL (ref 150–440)
RBC: 4.43 10*6/uL (ref 3.80–5.20)
RDW: 14.1 % (ref 11.5–14.5)
WBC: 6.8 10*3/uL (ref 3.6–11.0)

## 2013-09-13 LAB — PROTIME-INR
INR: 0.9
Prothrombin Time: 11.9 secs (ref 11.5–14.7)

## 2013-09-13 LAB — BASIC METABOLIC PANEL
Anion Gap: 3 — ABNORMAL LOW (ref 7–16)
BUN: 16 mg/dL (ref 7–18)
Calcium, Total: 8.8 mg/dL (ref 8.5–10.1)
Chloride: 106 mmol/L (ref 98–107)
Co2: 29 mmol/L (ref 21–32)
Creatinine: 0.64 mg/dL (ref 0.60–1.30)
EGFR (African American): >60
EGFR (Non-African Amer.): >60
Glucose: 115 mg/dL — ABNORMAL HIGH (ref 65–99)
Osmolality: 278 (ref 275–301)
Potassium: 4.1 mmol/L (ref 3.5–5.1)
Sodium: 138 mmol/L (ref 136–145)

## 2013-09-13 LAB — APTT: Activated PTT: 23.1 secs — ABNORMAL LOW (ref 23.6–35.9)

## 2013-09-13 LAB — SEDIMENTATION RATE: Erythrocyte Sed Rate: 5 mm/hr (ref 0–30)

## 2013-11-18 ENCOUNTER — Other Ambulatory Visit (HOSPITAL_COMMUNITY): Payer: Self-pay | Admitting: Internal Medicine

## 2013-11-18 DIAGNOSIS — Z1231 Encounter for screening mammogram for malignant neoplasm of breast: Secondary | ICD-10-CM

## 2013-11-19 ENCOUNTER — Other Ambulatory Visit (HOSPITAL_COMMUNITY): Payer: Self-pay | Admitting: Internal Medicine

## 2013-11-19 DIAGNOSIS — Z1231 Encounter for screening mammogram for malignant neoplasm of breast: Secondary | ICD-10-CM

## 2013-11-23 ENCOUNTER — Ambulatory Visit (HOSPITAL_COMMUNITY): Payer: Medicare Other

## 2013-11-23 ENCOUNTER — Ambulatory Visit (HOSPITAL_COMMUNITY): Payer: Self-pay

## 2013-11-23 ENCOUNTER — Ambulatory Visit (HOSPITAL_COMMUNITY)
Admission: RE | Admit: 2013-11-23 | Discharge: 2013-11-23 | Disposition: A | Discharge status: Home / Self Care | Payer: Medicare Other | Source: Ambulatory Visit | Attending: Internal Medicine | Admitting: Internal Medicine

## 2013-11-23 DIAGNOSIS — Z1231 Encounter for screening mammogram for malignant neoplasm of breast: Secondary | ICD-10-CM | POA: Insufficient documentation

## 2014-01-20 ENCOUNTER — Inpatient Hospital Stay (HOSPITAL_COMMUNITY): Admission: RE | Admit: 2014-01-20 | Payer: Medicare Other | Source: Ambulatory Visit

## 2014-10-19 ENCOUNTER — Other Ambulatory Visit (HOSPITAL_COMMUNITY): Payer: Self-pay | Admitting: Internal Medicine

## 2014-10-19 DIAGNOSIS — Z1231 Encounter for screening mammogram for malignant neoplasm of breast: Secondary | ICD-10-CM

## 2014-11-26 ENCOUNTER — Ambulatory Visit (HOSPITAL_COMMUNITY)
Admission: RE | Admit: 2014-11-26 | Discharge: 2014-11-26 | Disposition: A | Discharge status: Home / Self Care | Payer: Medicare Other | Source: Ambulatory Visit | Attending: Internal Medicine | Admitting: Internal Medicine

## 2014-11-26 DIAGNOSIS — Z1231 Encounter for screening mammogram for malignant neoplasm of breast: Secondary | ICD-10-CM | POA: Insufficient documentation

## 2015-10-13 ENCOUNTER — Encounter: Payer: Self-pay | Admitting: Internal Medicine

## 2015-12-09 ENCOUNTER — Other Ambulatory Visit (HOSPITAL_COMMUNITY): Payer: Self-pay | Admitting: Internal Medicine

## 2015-12-09 DIAGNOSIS — Z1231 Encounter for screening mammogram for malignant neoplasm of breast: Secondary | ICD-10-CM

## 2015-12-12 ENCOUNTER — Ambulatory Visit (HOSPITAL_COMMUNITY)
Admission: RE | Admit: 2015-12-12 | Discharge: 2015-12-12 | Disposition: A | Discharge status: Home / Self Care | Payer: Medicare Other | Source: Ambulatory Visit | Attending: Internal Medicine | Admitting: Internal Medicine

## 2015-12-12 DIAGNOSIS — Z1231 Encounter for screening mammogram for malignant neoplasm of breast: Secondary | ICD-10-CM | POA: Diagnosis not present

## 2016-01-11 ENCOUNTER — Other Ambulatory Visit (HOSPITAL_COMMUNITY): Payer: Self-pay | Admitting: Physician Assistant

## 2016-01-11 DIAGNOSIS — M810 Age-related osteoporosis without current pathological fracture: Secondary | ICD-10-CM

## 2016-01-18 ENCOUNTER — Ambulatory Visit (HOSPITAL_COMMUNITY)
Admission: RE | Admit: 2016-01-18 | Discharge: 2016-01-18 | Disposition: A | Discharge status: Home / Self Care | Payer: Medicare Other | Source: Ambulatory Visit | Attending: Physician Assistant | Admitting: Physician Assistant

## 2016-01-18 ENCOUNTER — Other Ambulatory Visit (HOSPITAL_COMMUNITY): Payer: Medicare Other

## 2016-01-18 DIAGNOSIS — M85851 Other specified disorders of bone density and structure, right thigh: Secondary | ICD-10-CM | POA: Diagnosis not present

## 2016-01-18 DIAGNOSIS — M8588 Other specified disorders of bone density and structure, other site: Secondary | ICD-10-CM | POA: Diagnosis not present

## 2016-01-18 DIAGNOSIS — M810 Age-related osteoporosis without current pathological fracture: Secondary | ICD-10-CM | POA: Diagnosis present

## 2016-01-18 DIAGNOSIS — Z780 Asymptomatic menopausal state: Secondary | ICD-10-CM | POA: Diagnosis not present

## 2016-07-10 DIAGNOSIS — M2550 Pain in unspecified joint: Secondary | ICD-10-CM | POA: Diagnosis not present

## 2016-07-10 DIAGNOSIS — E663 Overweight: Secondary | ICD-10-CM | POA: Diagnosis not present

## 2016-07-10 DIAGNOSIS — Z6828 Body mass index (BMI) 28.0-28.9, adult: Secondary | ICD-10-CM | POA: Diagnosis not present

## 2016-07-10 DIAGNOSIS — M353 Polymyalgia rheumatica: Secondary | ICD-10-CM | POA: Diagnosis not present

## 2016-07-10 DIAGNOSIS — M810 Age-related osteoporosis without current pathological fracture: Secondary | ICD-10-CM | POA: Diagnosis not present

## 2016-07-10 DIAGNOSIS — M150 Primary generalized (osteo)arthritis: Secondary | ICD-10-CM | POA: Diagnosis not present

## 2016-07-20 ENCOUNTER — Encounter: Payer: Self-pay | Admitting: Gastroenterology

## 2016-07-20 ENCOUNTER — Ambulatory Visit (INDEPENDENT_AMBULATORY_CARE_PROVIDER_SITE_OTHER): Payer: PPO | Admitting: Gastroenterology

## 2016-07-20 DIAGNOSIS — Z86010 Personal history of colonic polyps: Secondary | ICD-10-CM | POA: Diagnosis not present

## 2016-07-20 DIAGNOSIS — K219 Gastro-esophageal reflux disease without esophagitis: Secondary | ICD-10-CM | POA: Diagnosis not present

## 2016-07-20 DIAGNOSIS — K5900 Constipation, unspecified: Secondary | ICD-10-CM | POA: Insufficient documentation

## 2016-07-20 DIAGNOSIS — K625 Hemorrhage of anus and rectum: Secondary | ICD-10-CM | POA: Diagnosis not present

## 2016-07-20 MED ORDER — PANTOPRAZOLE SODIUM 40 MG PO TBEC: 40.0000 mg | DELAYED_RELEASE_TABLET | Freq: Every day | ORAL | 3 refills | Status: DC

## 2016-07-20 MED ORDER — LUBIPROSTONE 8 MCG PO CAPS: 8.0000 ug | ORAL_CAPSULE | Freq: Two times a day (BID) | ORAL | 3 refills | Status: DC

## 2016-07-20 NOTE — Patient Instructions (Addendum)
1. Start pantoprazole 40mg  daily 30 minutes before breakfast for acid reflux. RX sent to pharmacy. WE DID NOT HAVE ANY SAMPLES. 2. Try to avoid ibuprofen, advil, aleve, naproxen.  3. Start Amitiza 73mcg twice daily with food for constipation. RX sent to pharmacy and samples provided. We are starting low dose so call if you need me to increase dose. 4. Colonoscopy and upper endoscopy with Dr. Gala Romney. See separate instructions.

## 2016-07-20 NOTE — Progress Notes (Signed)
Primary Care Physician:  Wende Neighbors, MD  Primary Gastroenterologist:  Garfield Cornea, MD   Chief Complaint  Patient presents with  . Gastroesophageal Reflux    bad x 1 month  . Constipation    benefiber not helping  . Diarrhea  . Hemorrhoids    bleeding    HPI:  [name] is a 82 y.o. female here for further evaluation of recent onset GERD, due for surveillance colonoscopy. Reports severe heartburn for one months without previous history of GERD. Has tried multiple OTC antacids without relief. Denies dysphagia. Takes ASA 8amg daily, occasional ibuprofen or aleve. No n/v. Chronic constipation. Has been on Benefiber for years. Takes Miralax prn. Has required Dulcolax lately. Some brbpr she feels is from hemorrhlids. Complains of epigastric pain worse with meals.    Current Outpatient Prescriptions  Medication Sig Dispense Refill  . aspirin EC 81 MG tablet Take 81 mg by mouth daily.     Marland Kitchen ibuprofen (ADVIL,MOTRIN) 200 MG tablet Take 400 mg by mouth every 6 (six) hours as needed. For pain    . Multiple Vitamin (MULTIVITAMIN) capsule Take 1 capsule by mouth daily.     . naproxen sodium (ANAPROX) 220 MG tablet Take 220 mg by mouth every 8 (eight) hours as needed. For pain    . OVER THE COUNTER MEDICATION Calcium one tablet by mouth daily (pt unsure of dose)    . Specialty Vitamins Products (MAGNESIUM, AMINO ACID CHELATE,) 133 MG tablet Take 1 tablet by mouth daily.    . Wheat Dextrin (BENEFIBER PO) Take by mouth daily. Takes 3 tbsp by mouth daily    .       Marland Kitchen       No current facility-administered medications for this visit.     Allergies as of 07/20/2016  . (No Known Allergies)    Past Medical History:  Diagnosis Date  . GERD (gastroesophageal reflux disease)   . Hemorrhoids   . Hypercholesterolemia   . Polymyalgia rheumatica ([place])   . S/P colonoscopy Jun 14, 2004   friable internal and external hemorrhoids, left-sided diverticula  . S/P endoscopy August 13, 2006   pale 1-3 mm  nodules consistent with benign squamous papilloma, tiny hiatal hernia  . Serrated adenoma of colon     Past Surgical History:  Procedure Laterality Date  . ABDOMINAL HYSTERECTOMY    . CHOLECYSTECTOMY    . COLONOSCOPY  11/20/2010   Dr. Gala Romney- serrated adenomaL side diverticulosis, hemorrhoids  . COLONOSCOPY  06/14/04   friable internal and external hemorrhoids, left-sided diverticula  . ESOPHAGOGASTRODUODENOSCOPY  08/13/06   pale 1-3 mm nodules consistent with benign squamous papilloma, tiny hiatal hernia  . FLEXIBLE SIGMOIDOSCOPY  11/15/2011   Procedure: FLEXIBLE SIGMOIDOSCOPY;  Surgeon: Daneil Dolin, MD;  Location: [facility];  Service: Endoscopy;  Laterality: N/A;  12:30PM    Family History  Problem Relation Age of Onset  . Thyroid cancer Mother   . Colon cancer Neg Hx     Social History   Social History  . Marital status: Widowed    Spouse name: N/A  . Number of children: N/A  . Years of education: N/A   Occupational History  . Not on file.   Social History Main Topics  . Smoking status: Never Smoker  . Smokeless tobacco: Never Used  . Alcohol use 1.2 oz/week    2 Glasses of wine per week     Comment: Drinks wine 2-3 days per week ( usually a couple  of glasses each time)  . Drug use: No  . Sexual activity: Not on file   Other Topics Concern  . Not on file   Social History Narrative  . No narrative on file      ROS:  General: Negative for anorexia, weight loss, fever, chills, fatigue, weakness. Eyes: Negative for vision changes.  ENT: Negative for hoarseness, difficulty swallowing , nasal congestion. CV: Negative for chest pain, angina, palpitations, dyspnea on exertion, peripheral edema.  Respiratory: Negative for dyspnea at rest, dyspnea on exertion, cough, sputum, wheezing.  GI: See history of present illness. GU:  Negative for dysuria, hematuria, urinary incontinence, urinary frequency, nocturnal urination.  MS: Negative for joint pain, low back pain.   Derm: Negative for rash or itching.  Neuro: Negative for weakness, abnormal sensation, seizure, frequent headaches, memory loss, confusion.  Psych: Negative for anxiety, depression, suicidal ideation, hallucinations.  Endo: Negative for unusual weight change.  Heme: Negative for bruising or bleeding. Allergy: Negative for rash or hives.    Physical Examination:  BP 129/76   Pulse 81   Temp 97.8 F (36.6 C) (Oral)   Ht 5\' 7"  (1.702 m)   Wt 173 lb 12.8 oz (78.8 kg)   BMI 27.22 kg/m    General: Well-nourished, well-developed in no acute distress.  Head: Normocephalic, atraumatic.   Eyes: Conjunctiva pink, no icterus. Mouth: Oropharyngeal mucosa moist and pink , no lesions erythema or exudate. Neck: Supple without thyromegaly, masses, or lymphadenopathy.  Lungs: Clear to auscultation bilaterally.  Heart: Regular rate and rhythm, no murmurs rubs or gallops.  Abdomen: Bowel sounds are normal, mild to moderate epigastric tenderness, nondistended, no hepatosplenomegaly or masses, no abdominal bruits or    hernia , no rebound or guarding.   Rectal: not performed Extremities: No lower extremity edema. No clubbing or deformities.  Neuro: Alert and oriented x 4 , grossly normal neurologically.  Skin: Warm and dry, no rash or jaundice.   Psych: Alert and cooperative, normal mood and affect.

## 2016-07-22 NOTE — Assessment & Plan Note (Signed)
One month h/o acute onset epigastric pain, gerd with minimal response to OTC antacids. Some NSAIDs use, occasionally. ASA daily. ddx includes GERD, gastritis, pud. Recommend egd.  I have discussed the risks, alternatives, benefits with regards to but not limited to the risk of reaction to medication, bleeding, infection, perforation and the patient is agreeable to proceed. Written consent to be obtained.

## 2016-07-22 NOTE — Assessment & Plan Note (Signed)
Intermittent brbpr in setting of known hemorrhoids and adenomatous colon polyps, constipation. Start Amitiza 10mcg bid with food. Plan on colonoscopy.  I have discussed the risks, alternatives, benefits with regards to but not limited to the risk of reaction to medication, bleeding, infection, perforation and the patient is agreeable to proceed. Written consent to be obtained.

## 2016-07-23 ENCOUNTER — Other Ambulatory Visit: Payer: Self-pay

## 2016-07-23 DIAGNOSIS — K219 Gastro-esophageal reflux disease without esophagitis: Secondary | ICD-10-CM

## 2016-07-23 DIAGNOSIS — K625 Hemorrhage of anus and rectum: Secondary | ICD-10-CM

## 2016-07-23 DIAGNOSIS — Z86010 Personal history of colonic polyps: Secondary | ICD-10-CM

## 2016-07-23 MED ORDER — PEG 3350-KCL-NA BICARB-NACL 420 G PO SOLR: 4000.0000 mL | ORAL | 0 refills | Status: DC

## 2016-07-23 NOTE — Progress Notes (Signed)
CC'D TO PCP °

## 2016-07-25 ENCOUNTER — Telehealth: Payer: Self-pay | Admitting: Internal Medicine

## 2016-07-25 MED ORDER — LUBIPROSTONE 24 MCG PO CAPS: 24.0000 ug | ORAL_CAPSULE | Freq: Two times a day (BID) | ORAL | 3 refills | Status: DC

## 2016-07-25 NOTE — Telephone Encounter (Signed)
Pt called to let LSL know that the Amitiza was not helping and to see if LSL needed to increase the dose or try something else. Please advise and call [phone]

## 2016-07-25 NOTE — Telephone Encounter (Signed)
We can increase to 57mcg bid with food. Samples can be given. I will also send in RX. Tell patient that if she has not filled the 51mcg RX, she will need to make sure pharmacy gives her the 33mcg when she picks up new RX.

## 2016-07-25 NOTE — Addendum Note (Signed)
Addended by: Mahala Menghini on: 07/25/2016 10:36 AM   Modules accepted: Orders

## 2016-07-25 NOTE — Telephone Encounter (Signed)
Routing to LSL 

## 2016-07-31 NOTE — Telephone Encounter (Signed)
Tried to call pt- NA- LMOM with recommendations.  

## 2016-08-06 DIAGNOSIS — E559 Vitamin D deficiency, unspecified: Secondary | ICD-10-CM | POA: Diagnosis not present

## 2016-08-06 DIAGNOSIS — R7301 Impaired fasting glucose: Secondary | ICD-10-CM | POA: Diagnosis not present

## 2016-08-06 DIAGNOSIS — E782 Mixed hyperlipidemia: Secondary | ICD-10-CM | POA: Diagnosis not present

## 2016-08-06 DIAGNOSIS — R946 Abnormal results of thyroid function studies: Secondary | ICD-10-CM | POA: Diagnosis not present

## 2016-08-08 ENCOUNTER — Telehealth: Payer: Self-pay | Admitting: Internal Medicine

## 2016-08-08 DIAGNOSIS — R7301 Impaired fasting glucose: Secondary | ICD-10-CM | POA: Diagnosis not present

## 2016-08-08 DIAGNOSIS — M353 Polymyalgia rheumatica: Secondary | ICD-10-CM | POA: Diagnosis not present

## 2016-08-08 DIAGNOSIS — E559 Vitamin D deficiency, unspecified: Secondary | ICD-10-CM | POA: Diagnosis not present

## 2016-08-08 DIAGNOSIS — M810 Age-related osteoporosis without current pathological fracture: Secondary | ICD-10-CM | POA: Diagnosis not present

## 2016-08-08 DIAGNOSIS — M19041 Primary osteoarthritis, right hand: Secondary | ICD-10-CM | POA: Diagnosis not present

## 2016-08-08 DIAGNOSIS — R5383 Other fatigue: Secondary | ICD-10-CM | POA: Diagnosis not present

## 2016-08-08 DIAGNOSIS — K5900 Constipation, unspecified: Secondary | ICD-10-CM | POA: Diagnosis not present

## 2016-08-08 DIAGNOSIS — Z6826 Body mass index (BMI) 26.0-26.9, adult: Secondary | ICD-10-CM | POA: Diagnosis not present

## 2016-08-08 DIAGNOSIS — R030 Elevated blood-pressure reading, without diagnosis of hypertension: Secondary | ICD-10-CM | POA: Diagnosis not present

## 2016-08-08 NOTE — Telephone Encounter (Signed)
PLEASE CALL PATIENT, WANTS TO CANCEL ONE OF HER PROCEDURES

## 2016-08-08 NOTE — Telephone Encounter (Signed)
See other phone note. VM went to [place] and she has taken care of this.

## 2016-08-08 NOTE — Telephone Encounter (Signed)
Pt called asking to speak with LSL. I told her that LSL was rounding at the hospital and she wanted to know when she would be in. I told her that she has patients here to see and it may be later this afternoon or she could go to the nurses VM. I transferred call to JL VM

## 2016-08-08 NOTE — Telephone Encounter (Signed)
Pt also left message on my voicemail. She said she wanted to have colonoscopy done, but her insurance will not cover EGD. Pt is scheduled for TCS/EGD with [facility] 08/23/16. Did not see that a PA has been done for procedures. PA info faxed to [facility] for TCS/EGD.  Tried to call pt, LMOVM and informed pt that PA had been submitted to her insurance company and for her to call office.

## 2016-08-09 NOTE — Telephone Encounter (Signed)
Spoke to pt and informed her that PA info was faxed to [facility] yesterday for procedure. Will await response from insurance co before cancelling EGD.

## 2016-08-13 NOTE — Telephone Encounter (Signed)
Received fax from [facility]. TCS/EGD approved. Authorization# [id number]. 08/08/16-11/07/16.

## 2016-08-13 NOTE — Telephone Encounter (Signed)
Called and informed pt that fax was received from [facility], TCS/EGD were approved.

## 2016-08-22 ENCOUNTER — Encounter (HOSPITAL_COMMUNITY): Payer: Self-pay | Admitting: *Deleted

## 2016-08-23 ENCOUNTER — Encounter (HOSPITAL_COMMUNITY): Payer: Self-pay | Admitting: *Deleted

## 2016-08-23 ENCOUNTER — Ambulatory Visit (HOSPITAL_COMMUNITY)
Admission: RE | Admit: 2016-08-23 | Discharge: 2016-08-23 | Disposition: A | Discharge status: Home / Self Care | Payer: PPO | Source: Ambulatory Visit | Attending: Internal Medicine | Admitting: Internal Medicine

## 2016-08-23 ENCOUNTER — Encounter (HOSPITAL_COMMUNITY)
Admission: RE | Disposition: A | Discharge status: Home / Self Care | Payer: Self-pay | Source: Ambulatory Visit | Attending: Internal Medicine

## 2016-08-23 DIAGNOSIS — K219 Gastro-esophageal reflux disease without esophagitis: Secondary | ICD-10-CM | POA: Insufficient documentation

## 2016-08-23 DIAGNOSIS — K625 Hemorrhage of anus and rectum: Secondary | ICD-10-CM

## 2016-08-23 DIAGNOSIS — Z79899 Other long term (current) drug therapy: Secondary | ICD-10-CM | POA: Insufficient documentation

## 2016-08-23 DIAGNOSIS — Z86010 Personal history of colonic polyps: Secondary | ICD-10-CM | POA: Diagnosis not present

## 2016-08-23 DIAGNOSIS — R12 Heartburn: Secondary | ICD-10-CM | POA: Diagnosis not present

## 2016-08-23 DIAGNOSIS — M353 Polymyalgia rheumatica: Secondary | ICD-10-CM | POA: Insufficient documentation

## 2016-08-23 DIAGNOSIS — Z7982 Long term (current) use of aspirin: Secondary | ICD-10-CM | POA: Diagnosis not present

## 2016-08-23 DIAGNOSIS — K5730 Diverticulosis of large intestine without perforation or abscess without bleeding: Secondary | ICD-10-CM | POA: Diagnosis not present

## 2016-08-23 DIAGNOSIS — K921 Melena: Secondary | ICD-10-CM | POA: Diagnosis not present

## 2016-08-23 DIAGNOSIS — Z9049 Acquired absence of other specified parts of digestive tract: Secondary | ICD-10-CM | POA: Diagnosis not present

## 2016-08-23 DIAGNOSIS — K641 Second degree hemorrhoids: Secondary | ICD-10-CM | POA: Diagnosis not present

## 2016-08-23 HISTORY — PX: ESOPHAGOGASTRODUODENOSCOPY: SHX5428

## 2016-08-23 HISTORY — PX: COLONOSCOPY: SHX5424

## 2016-08-23 SURGERY — COLONOSCOPY
Anesthesia: Moderate Sedation

## 2016-08-23 MED ORDER — ONDANSETRON HCL 4 MG/2ML IJ SOLN
INTRAMUSCULAR | Status: AC
  Filled 2016-08-23: qty 2

## 2016-08-23 MED ORDER — STERILE WATER FOR IRRIGATION IR SOLN
Status: DC | PRN
  Administered 2016-08-23: 2.5 mL

## 2016-08-23 MED ORDER — MIDAZOLAM HCL 5 MG/5ML IJ SOLN
INTRAMUSCULAR | Status: AC
  Filled 2016-08-23: qty 10

## 2016-08-23 MED ORDER — ONDANSETRON HCL 4 MG/2ML IJ SOLN
INTRAMUSCULAR | Status: DC | PRN
Start: 2016-08-23 — End: 2016-08-23
  Administered 2016-08-23: 4 mg via INTRAVENOUS

## 2016-08-23 MED ORDER — LIDOCAINE VISCOUS 2 % MT SOLN
OROMUCOSAL | Status: DC | PRN
  Administered 2016-08-23: 1 via OROMUCOSAL

## 2016-08-23 MED ORDER — MEPERIDINE HCL 100 MG/ML IJ SOLN
INTRAMUSCULAR | Status: DC | PRN
  Administered 2016-08-23: 25 mg via INTRAVENOUS
  Administered 2016-08-23: 50 mg via INTRAVENOUS

## 2016-08-23 MED ORDER — MIDAZOLAM HCL 5 MG/5ML IJ SOLN
INTRAMUSCULAR | Status: DC | PRN
  Administered 2016-08-23 (×2): 1 mg via INTRAVENOUS
  Administered 2016-08-23: 2 mg via INTRAVENOUS
  Administered 2016-08-23: 1 mg via INTRAVENOUS

## 2016-08-23 MED ORDER — LIDOCAINE VISCOUS 2 % MT SOLN
OROMUCOSAL | Status: AC
  Filled 2016-08-23: qty 15

## 2016-08-23 MED ORDER — SODIUM CHLORIDE 0.9 % IV SOLN
INTRAVENOUS | Status: DC
  Administered 2016-08-23: 13:00:00 via INTRAVENOUS

## 2016-08-23 MED ORDER — MEPERIDINE HCL 100 MG/ML IJ SOLN
INTRAMUSCULAR | Status: AC
  Filled 2016-08-23: qty 2

## 2016-08-23 NOTE — Op Note (Signed)
[facility] Patient Name: [name] Procedure Date: 08/23/2016 1:23 PM MRN: [id number] Date of Birth: 1935/01/02 Attending MD: Norvel Richards , MD CSN: [id number] Age: 82 Admit Type: Outpatient Procedure:                Colonoscopy Indications:              High risk colon cancer surveillance: Personal                            history of colonic polyps Providers:                Norvel Richards, MD, Lurline Del, RN, Aram Candela Referring MD:              Medicines:                Midazolam 5 mg IV, Meperidine 75 mg IV, Ondansetron                            4 mg IV Complications:            No immediate complications. Estimated Blood Loss:     Estimated blood loss: none. Procedure:                Pre-Anesthesia Assessment:                           - Prior to the procedure, a History and Physical                            was performed, and patient medications and                            allergies were reviewed. The patient's tolerance of                            previous anesthesia was also reviewed. The risks                            and benefits of the procedure and the sedation                            options and risks were discussed with the patient.                            All questions were answered, and informed consent                            was obtained. Prior Anticoagulants: The patient has                            taken no previous anticoagulant or antiplatelet  agents. ASA Grade Assessment: II - A patient with                            mild systemic disease. After reviewing the risks                            and benefits, the patient was deemed in                            satisfactory condition to undergo the procedure.                           After obtaining informed consent, the colonoscope                            was passed under direct vision. Throughout the                      procedure, the patient's blood pressure, pulse, and                            oxygen saturations were monitored continuously. The                            EC-[id number] ([id number]) scope was introduced through                            the anus and advanced to the the cecum, identified                            by appendiceal orifice and ileocecal valve. The                            ileocecal valve, appendiceal orifice, and rectum                            were photographed. The entire colon was well                            visualized. The colonoscopy was performed without                            difficulty. The quality of the bowel preparation                            was adequate. Scope In: 1:29:32 PM Scope Out: 1:47:05 PM Scope Withdrawal Time: 0 hours 6 minutes 56 seconds  Total Procedure Duration: 0 hours 17 minutes 33 seconds  Findings:      The perianal and digital rectal examinations were normal.      Internal hemorrhoids were found during retroflexion. The hemorrhoids       were medium-sized and Grade II (internal hemorrhoids that prolapse but       reduce spontaneously).      Multiple small and large-mouthed diverticula were found in the entire  colon.      The exam was otherwise without abnormality on direct and retroflexion       views. Impression:               - Internal hemorrhoids.                           - Diverticulosis in the entire examined colon.                           - The examination was otherwise normal on direct                            and retroflexion views.                           - No specimens collected. Moderate Sedation:      Moderate (conscious) sedation was administered by the endoscopy nurse       and supervised by the endoscopist. The following parameters were       monitored: oxygen saturation, heart rate, blood pressure, respiratory       rate, EKG, adequacy of pulmonary ventilation, and response to  care.       Total physician intraservice time was 35 minutes. Recommendation:           - Written discharge instructions were provided to                            the patient.                           - The signs and symptoms of potential delayed                            complications were discussed with the patient.                           - Patient has a contact number available for                            emergencies.                           - Return to normal activities tomorrow.                           - Resume previous diet.                           - Continue present medications.                           - No repeat colonoscopy due to age.                           - Return to GI clinic in 3 months. Pamphlet on  hemorrhoid banding provided. Begin Benefiber 1                            tablespoon twice daily. See EGD report. Procedure Code(s):        --- Professional ---                           [id number], Colonoscopy, flexible; diagnostic, including                            collection of specimen(s) by brushing or washing,                            when performed (separate procedure)                           99152, Moderate sedation services provided by the                            same physician or other qualified health care                            professional performing the diagnostic or                            therapeutic service that the sedation supports,                            requiring the presence of an independent trained                            observer to assist in the monitoring of the                            patient's level of consciousness and physiological                            status; initial 15 minutes of intraservice time,                            patient age 42 years or older                           [phone], Moderate sedation services; each additional                            15 minutes  intraservice time Diagnosis Code(s):        --- Professional ---                           Z86.010, Personal history of colonic polyps                           K64.1, Second degree hemorrhoids  K57.30, Diverticulosis of large intestine without                            perforation or abscess without bleeding CPT copyright 2016 [employer]. All rights reserved. The codes documented in this report are preliminary and upon coder review may  be revised to meet current compliance requirements. Cristopher Estimable. Zayanna Pundt, MD Norvel Richards, MD 08/23/2016 1:55:45 PM This report has been signed electronically. Number of Addenda: 0

## 2016-08-23 NOTE — Discharge Instructions (Addendum)
EGD Discharge instructions Please read the instructions outlined below and refer to this sheet in the next few weeks. These discharge instructions provide you with general information on caring for yourself after you leave the hospital. Your doctor may also give you specific instructions. While your treatment has been planned according to the most current medical practices available, unavoidable complications occasionally occur. If you have any problems or questions after discharge, please call your doctor. ACTIVITY  You may resume your regular activity but move at a slower pace for the next 24 hours.   Take frequent rest periods for the next 24 hours.   Walking will help expel (get rid of) the air and reduce the bloated feeling in your abdomen.   No driving for 24 hours (because of the anesthesia (medicine) used during the test).   You may shower.   Do not sign any important legal documents or operate any machinery for 24 hours (because of the anesthesia used during the test).  NUTRITION  Drink plenty of fluids.   You may resume your normal diet.   Begin with a light meal and progress to your normal diet.   Avoid alcoholic beverages for 24 hours or as instructed by your caregiver.  MEDICATIONS  You may resume your normal medications unless your caregiver tells you otherwise.  WHAT YOU CAN EXPECT TODAY  You may experience abdominal discomfort such as a feeling of fullness or gas pains.  FOLLOW-UP  Your doctor will discuss the results of your test with you.  SEEK IMMEDIATE MEDICAL ATTENTION IF ANY OF THE FOLLOWING OCCUR:  Excessive nausea (feeling sick to your stomach) and/or vomiting.   Severe abdominal pain and distention (swelling).   Trouble swallowing.   Temperature over 101 F (37.8 C).   Rectal bleeding or vomiting of blood.   Colonoscopy Discharge Instructions  Read the instructions outlined below and refer to this sheet in the next few weeks. These  discharge instructions provide you with general information on caring for yourself after you leave the hospital. Your doctor may also give you specific instructions. While your treatment has been planned according to the most current medical practices available, unavoidable complications occasionally occur. If you have any problems or questions after discharge, call Dr. Gala Romney at [phone]. ACTIVITY  You may resume your regular activity, but move at a slower pace for the next 24 hours.   Take frequent rest periods for the next 24 hours.   Walking will help get rid of the air and reduce the bloated feeling in your belly (abdomen).   No driving for 24 hours (because of the medicine (anesthesia) used during the test).    Do not sign any important legal documents or operate any machinery for 24 hours (because of the anesthesia used during the test).  NUTRITION  Drink plenty of fluids.   You may resume your normal diet as instructed by your doctor.   Begin with a light meal and progress to your normal diet. Heavy or fried foods are harder to digest and may make you feel sick to your stomach (nauseated).   Avoid alcoholic beverages for 24 hours or as instructed.  MEDICATIONS  You may resume your normal medications unless your doctor tells you otherwise.  WHAT YOU CAN EXPECT TODAY  Some feelings of bloating in the abdomen.   Passage of more gas than usual.   Spotting of blood in your stool or on the toilet paper.  IF YOU HAD POLYPS REMOVED DURING THE COLONOSCOPY:  No aspirin products for 7 days or as instructed.   No alcohol for 7 days or as instructed.   Eat a soft diet for the next 24 hours.  FINDING OUT THE RESULTS OF YOUR TEST Not all test results are available during your visit. If your test results are not back during the visit, make an appointment with your caregiver to find out the results. Do not assume everything is normal if you have not heard from your caregiver or the  medical facility. It is important for you to follow up on all of your test results.  SEEK IMMEDIATE MEDICAL ATTENTION IF:  You have more than a spotting of blood in your stool.   Your belly is swollen (abdominal distention).   You are nauseated or vomiting.   You have a temperature over 101.   You have abdominal pain or discomfort that is severe or gets worse throughout the day.    Hemorrhoid and diverticulosis information provided  Provide pamphlet on hemorrhoid banding  Begin Benefiber 1 tablespoon twice daily  Office visit with [place] in 3 months  I do not recommend a future colonoscopy unless new symptoms devel Diverticulosis Diverticulosis is a condition that develops when small pouches (diverticula) form in the wall of the large intestine (colon). The colon is where water is absorbed and stool is formed. The pouches form when the inside layer of the colon pushes through weak spots in the outer layers of the colon. You may have a few pouches or many of them. What are the causes? The cause of this condition is not known. What increases the risk? The following factors may make you more likely to develop this condition:  Being older than age 69. Your risk for this condition increases with age. Diverticulosis is rare among people younger than age 48. By age 43, many people have it.  Eating a low-fiber diet.  Having frequent constipation.  Being overweight.  Not getting enough exercise.  Smoking.  Taking over-the-counter pain medicines, like aspirin and ibuprofen.  Having a family history of diverticulosis. What are the signs or symptoms? In most people, there are no symptoms of this condition. If you do have symptoms, they may include:  Bloating.  Cramps in the abdomen.  Constipation or diarrhea.  Pain in the lower left side of the abdomen. How is this diagnosed? This condition is most often diagnosed during an exam for other colon problems. Because diverticulosis  usually has no symptoms, it often cannot be diagnosed independently. This condition may be diagnosed by:  Using a flexible scope to examine the colon (colonoscopy).  Taking an X-ray of the colon after dye has been put into the colon (barium enema).  Doing a CT scan. How is this treated? You may not need treatment for this condition if you have never developed an infection related to diverticulosis. If you have had an infection before, treatment may include:  Eating a high-fiber diet. This may include eating more fruits, vegetables, and grains.  Taking a fiber supplement.  Taking a live bacteria supplement (probiotic).  Taking medicine to relax your colon.  Taking antibiotic medicines. Follow these instructions at home:  Drink 6-8 glasses of water or more each day to prevent constipation.  Try not to strain when you have a bowel movement.  If you have had an infection before:  Eat more fiber as directed by your health care provider or your diet and nutrition specialist (dietitian).  Take a fiber supplement or probiotic, if  your health care provider approves.  Take over-the-counter and prescription medicines only as told by your health care provider.  If you were prescribed an antibiotic, take it as told by your health care provider. Do not stop taking the antibiotic even if you start to feel better.  Keep all follow-up visits as told by your health care provider. This is important. Contact a health care provider if:  You have pain in your abdomen.  You have bloating.  You have cramps.  You have not had a bowel movement in 3 days. Get help right away if:  Your pain gets worse.  Your bloating becomes very bad.  You have a fever or chills, and your symptoms suddenly get worse.  You vomit.  You have bowel movements that are bloody or black.  You have bleeding from your rectum. Summary  Diverticulosis is a condition that develops when small pouches (diverticula)  form in the wall of the large intestine (colon).  You may have a few pouches or many of them.  This condition is most often diagnosed during an exam for other colon problems.  If you have had an infection related to diverticulosis, treatment may include increasing the fiber in your diet, taking supplements, or taking medicines. This information is not intended to replace advice given to you by your health care provider. Make sure you discuss any questions you have with your health care provider. Document Released: 01/19/2004 Document Revised: 03/12/2016 Document Reviewed: 03/12/2016 Elsevier Interactive Patient Education  2017 [employer].    Hemorrhoids Hemorrhoids are swollen veins in and around the rectum or anus. There are two types of hemorrhoids:  Internal hemorrhoids. These occur in the veins that are just inside the rectum. They may poke through to the outside and become irritated and painful.  External hemorrhoids. These occur in the veins that are outside of the anus and can be felt as a painful swelling or hard lump near the anus. Most hemorrhoids do not cause serious problems, and they can be managed with home treatments such as diet and lifestyle changes. If home treatments do not help your symptoms, procedures can be done to shrink or remove the hemorrhoids. What are the causes? This condition is caused by increased pressure in the anal area. This pressure may result from various things, including:  Constipation.  Straining to have a bowel movement.  Diarrhea.  Pregnancy.  Obesity.  Sitting for long periods of time.  Heavy lifting or other activity that causes you to strain.  Anal sex. What are the signs or symptoms? Symptoms of this condition include:  Pain.  Anal itching or irritation.  Rectal bleeding.  Leakage of stool (feces).  Anal swelling.  One or more lumps around the anus. How is this diagnosed? This condition can often be diagnosed  through a visual exam. Other exams or tests may also be done, such as:  Examination of the rectal area with a gloved hand (digital rectal exam).  Examination of the anal canal using a small tube (anoscope).  A blood test, if you have lost a significant amount of blood.  A test to look inside the colon (sigmoidoscopy or colonoscopy). How is this treated? This condition can usually be treated at home. However, various procedures may be done if dietary changes, lifestyle changes, and other home treatments do not help your symptoms. These procedures can help make the hemorrhoids smaller or remove them completely. Some of these procedures involve surgery, and others do not. Common procedures  include:  Rubber band ligation. Rubber bands are placed at the base of the hemorrhoids to cut off the blood supply to them.  Sclerotherapy. Medicine is injected into the hemorrhoids to shrink them.  Infrared coagulation. A type of light energy is used to get rid of the hemorrhoids.  Hemorrhoidectomy surgery. The hemorrhoids are surgically removed, and the veins that supply them are tied off.  Stapled hemorrhoidopexy surgery. A circular stapling device is used to remove the hemorrhoids and use staples to cut off the blood supply to them. Follow these instructions at home: Eating and drinking   Eat foods that have a lot of fiber in them, such as whole grains, beans, nuts, fruits, and vegetables. Ask your health care provider about taking products that have added fiber (fiber supplements).  Drink enough fluid to keep your urine clear or pale yellow. Managing pain and swelling   Take warm sitz baths for 20 minutes, 3-4 times a day to ease pain and discomfort.  If directed, apply ice to the affected area. Using ice packs between sitz baths may be helpful.  Put ice in a plastic bag.  Place a towel between your skin and the bag.  Leave the ice on for 20 minutes, 2-3 times a day. General instructions    Take over-the-counter and prescription medicines only as told by your health care provider.  Use medicated creams or suppositories as told.  Exercise regularly.  Go to the bathroom when you have the urge to have a bowel movement. Do not wait.  Avoid straining to have bowel movements.  Keep the anal area dry and clean. Use wet toilet paper or moist towelettes after a bowel movement.  Do not sit on the toilet for long periods of time. This increases blood pooling and pain. Contact a health care provider if:  You have increasing pain and swelling that are not controlled by treatment or medicine.  You have uncontrolled bleeding.  You have difficulty having a bowel movement, or you are unable to have a bowel movement.  You have pain or inflammation outside the area of the hemorrhoids. This information is not intended to replace advice given to you by your health care provider. Make sure you discuss any questions you have with your health care provider. Document Released: 04/20/2000 Document Revised: 09/21/2015 Document Reviewed: 01/05/2015 Elsevier Interactive Patient Education  2017 [employer].

## 2016-08-23 NOTE — H&P (Signed)
@LOGO @   Primary Care Physician:  Wende Neighbors, MD Primary Gastroenterologist:  Dr. Gala Romney  Pre-Procedure History & Physical: HPI:  [name] is a 82 y.o. female here for EGD to further evaluate worsening reflux symptoms. Also, here for hopefully, the more surveillance colonoscopy.  Paper hematochezia intermittently. Reflux symptoms markedly improved with Protonix 40 mg daily. No dysphagia.  Past Medical History:  Diagnosis Date  . GERD (gastroesophageal reflux disease)   . Hemorrhoids   . Hypercholesterolemia   . Polymyalgia rheumatica ([place])   . S/P colonoscopy Jun 14, 2004   friable internal and external hemorrhoids, left-sided diverticula  . S/P endoscopy August 13, 2006   pale 1-3 mm nodules consistent with benign squamous papilloma, tiny hiatal hernia  . Serrated adenoma of colon     Past Surgical History:  Procedure Laterality Date  . ABDOMINAL HYSTERECTOMY    . CHOLECYSTECTOMY    . COLONOSCOPY  11/20/2010   Dr. Gala Romney- serrated adenomaL side diverticulosis, hemorrhoids  . COLONOSCOPY  06/14/04   friable internal and external hemorrhoids, left-sided diverticula  . ESOPHAGOGASTRODUODENOSCOPY  08/13/06   pale 1-3 mm nodules consistent with benign squamous papilloma, tiny hiatal hernia  . FLEXIBLE SIGMOIDOSCOPY  11/15/2011   Procedure: FLEXIBLE SIGMOIDOSCOPY;  Surgeon: Daneil Dolin, MD;  Location: [facility];  Service: Endoscopy;  Laterality: N/A;  12:30PM    Prior to Admission medications   Medication Sig Start Date End Date Taking? Authorizing Provider  aspirin EC 81 MG tablet Take 81 mg by mouth daily.    Yes Historical Provider, MD  Calcium Carb-Cholecalciferol (CALCIUM 600 + D PO) Take 2 tablets by mouth daily.   Yes Historical Provider, MD  diphenhydrAMINE (BENADRYL) 25 MG tablet Take 25 mg by mouth at bedtime as needed for sleep.   Yes Historical Provider, MD  lubiprostone (AMITIZA) 24 MCG capsule Take 1 capsule (24 mcg total) by mouth 2 (two) times daily with a meal.  07/25/16  Yes Mahala Menghini, PA-C  Magnesium 200 MG TABS Take 200 mg by mouth daily.   Yes Historical Provider, MD  Multiple Vitamin (MULTIVITAMIN WITH MINERALS) TABS tablet Take 1 tablet by mouth daily.   Yes Historical Provider, MD  naproxen sodium (ALEVE) 220 MG tablet Take 220 mg by mouth daily as needed (pain).   Yes Historical Provider, MD  pantoprazole (PROTONIX) 40 MG tablet Take 1 tablet (40 mg total) by mouth daily before breakfast. 07/20/16  Yes Mahala Menghini, PA-C  polyethylene glycol-electrolytes (TRILYTE) 420 g solution Take 4,000 mLs by mouth as directed. 07/23/16  Yes Daneil Dolin, MD    Allergies as of 07/23/2016  . (No Known Allergies)    Family History  Problem Relation Age of Onset  . Thyroid cancer Mother   . Colon cancer Neg Hx     Social History   Social History  . Marital status: Widowed    Spouse name: N/A  . Number of children: N/A  . Years of education: N/A   Occupational History  . Not on file.   Social History Main Topics  . Smoking status: Never Smoker  . Smokeless tobacco: Never Used  . Alcohol use 1.2 oz/week    2 Glasses of wine per week     Comment: Drinks wine 2-3 days per week ( usually a couple of glasses each time)  . Drug use: No  . Sexual activity: Not on file   Other Topics Concern  . Not on file   Social History  Narrative  . No narrative on file    Review of Systems: See HPI, otherwise negative ROS  Physical Exam: BP (!) 134/58   Pulse 71   Temp 98.1 F (36.7 C) (Oral)   Resp 18   Ht 5\' 8"  (1.727 m)   Wt 165 lb (74.8 kg)   SpO2 98%   BMI 25.09 kg/m  General:   Alert,  Well-developed, well-nourished, pleasant and cooperative in NAD Neck:  Supple; no masses or thyromegaly. No significant cervical adenopathy. Lungs:  Clear throughout to auscultation.   No wheezes, crackles, or rhonchi. No acute distress. Heart:  Regular rate and rhythm; no murmurs, clicks, rubs,  or gallops. Abdomen: Non-distended, normal bowel  sounds.  Soft and nontender without appreciable mass or hepatosplenomegaly.  Pulses:  Normal pulses noted. Extremities:  Without clubbing or edema.  Impression: Worsening GERD symptoms. History of colonic polyp and paper hematochezia.  Recommendations: I have offered the Patient both an EGD and colonoscopy today.  The risks, benefits, limitations, imponderables and alternatives regarding both EGD and colonoscopy have been reviewed with the patient. Questions have been answered. All parties agreeable.    Notice: This dictation was prepared with Dragon dictation along with smaller phrase technology. Any transcriptional errors that result from this process are unintentional and may not be corrected upon review.

## 2016-08-23 NOTE — Op Note (Signed)
[facility] Patient Name: [name] Procedure Date: 08/23/2016 12:59 PM MRN: [id number] Date of Birth: 1934-09-12 Attending MD: Norvel Richards , MD CSN: [id number] Age: 82 Admit Type: Outpatient Procedure:                Upper GI endoscopy Indications:              Heartburn Providers:                Norvel Richards, MD, Lurline Del, RN, Aram Candela Referring MD:              Medicines:                Midazolam 3 mg IV, Meperidine 75 mg IV Complications:            No immediate complications. Estimated Blood Loss:     Estimated blood loss: none. Procedure:                Pre-Anesthesia Assessment:                           - Prior to the procedure, a History and Physical                            was performed, and patient medications and                            allergies were reviewed. The patient's tolerance of                            previous anesthesia was also reviewed. The risks                            and benefits of the procedure and the sedation                            options and risks were discussed with the patient.                            All questions were answered, and informed consent                            was obtained. Prior Anticoagulants: The patient has                            taken no previous anticoagulant or antiplatelet                            agents. ASA Grade Assessment: II - A patient with                            mild systemic disease. After reviewing the risks  and benefits, the patient was deemed in                            satisfactory condition to undergo the procedure.                           After obtaining informed consent, the endoscope was                            passed under direct vision. Throughout the                            procedure, the patient's blood pressure, pulse, and                            oxygen saturations were monitored  continuously. The                            EG-[id number] ([id number]) scope was introduced through the                            mouth, and advanced to the second part of duodenum.                            The upper GI endoscopy was accomplished without                            difficulty. The patient tolerated the procedure                            well. Scope In: 1:21:37 PM Scope Out: 1:23:41 PM Total Procedure Duration: 0 hours 2 minutes 4 seconds  Findings:      The examined esophagus was normal.      The entire examined stomach was normal.      The duodenal bulb and second portion of the duodenum were normal. Impression:               - Normal esophagus.                           - Normal stomach.                           - Normal duodenal bulb and second portion of the                            duodenum.                           - No specimens collected. reflux symptoms much                            improved with Protonix 40 mg daily Moderate Sedation:      Moderate (conscious) sedation was administered by the endoscopy nurse       and supervised by the endoscopist. The following  parameters were       monitored: oxygen saturation, heart rate, blood pressure, respiratory       rate, EKG, adequacy of pulmonary ventilation, and response to care.       Total physician intraservice time was 20 minutes. Recommendation:           - Await pathology results.                           - No repeat upper endoscopy.                           - Return to GI office in 3 months. Continue                            Protonix 40 mg daily. See colonoscopy report.                           - Patient has a contact number available for                            emergencies. The signs and symptoms of potential                            delayed complications were discussed with the                            patient. Return to normal activities tomorrow.                            Written  discharge instructions were provided to the                            patient.                           - Resume previous diet.                           - Continue present medications.                           - No repeat upper endoscopy.                           - Return to GI office in 3 months. Procedure Code(s):        --- Professional ---                           [phone], Esophagogastroduodenoscopy, flexible,                            transoral; diagnostic, including collection of                            specimen(s) by brushing or washing, when performed                            (  separate procedure)                           99152, Moderate sedation services provided by the                            same physician or other qualified health care                            professional performing the diagnostic or                            therapeutic service that the sedation supports,                            requiring the presence of an independent trained                            observer to assist in the monitoring of the                            patient's level of consciousness and physiological                            status; initial 15 minutes of intraservice time,                            patient age 55 years or older Diagnosis Code(s):        --- Professional ---                           R12, Heartburn CPT copyright 2016 American Medical Association. All rights reserved. The codes documented in this report are preliminary and upon coder review may  be revised to meet current compliance requirements. Cristopher Estimable. Haskell Rihn, MD Norvel Richards, MD 08/23/2016 1:28:26 PM This report has been signed electronically. Number of Addenda: 0

## 2016-08-29 ENCOUNTER — Encounter (HOSPITAL_COMMUNITY): Payer: Self-pay | Admitting: Internal Medicine

## 2016-10-10 DIAGNOSIS — N390 Urinary tract infection, site not specified: Secondary | ICD-10-CM | POA: Diagnosis not present

## 2016-10-10 DIAGNOSIS — Z6826 Body mass index (BMI) 26.0-26.9, adult: Secondary | ICD-10-CM | POA: Diagnosis not present

## 2016-11-22 ENCOUNTER — Encounter: Payer: Self-pay | Admitting: Gastroenterology

## 2016-11-22 ENCOUNTER — Ambulatory Visit (INDEPENDENT_AMBULATORY_CARE_PROVIDER_SITE_OTHER): Payer: PPO | Admitting: Gastroenterology

## 2016-11-22 VITALS — BP 136/68 | HR 71 | Temp 97.1°F | Ht 68.0 in | Wt 168.4 lb

## 2016-11-22 DIAGNOSIS — K649 Unspecified hemorrhoids: Secondary | ICD-10-CM | POA: Insufficient documentation

## 2016-11-22 DIAGNOSIS — K641 Second degree hemorrhoids: Secondary | ICD-10-CM | POA: Diagnosis not present

## 2016-11-22 DIAGNOSIS — K219 Gastro-esophageal reflux disease without esophagitis: Secondary | ICD-10-CM

## 2016-11-22 DIAGNOSIS — K5900 Constipation, unspecified: Secondary | ICD-10-CM

## 2016-11-22 NOTE — Assessment & Plan Note (Signed)
Intermittent bright red blood per rectum. Grade 2 internal hemorrhoids on recent colonoscopy. Uses topical regimens as needed. Discussed [facility] banding with patient. Handout provided. She'll let us know if she wants to pursue hemorrhoid banding, at this time she is not interested. Office visit as needed.

## 2016-11-22 NOTE — Patient Instructions (Signed)
1. Please call us if you would like to pursue hemorrhoid banding. 2. Please call with any other questions or concerns.

## 2016-11-22 NOTE — Assessment & Plan Note (Signed)
Clinically doing well on Benefiber daily and MiraLAX as needed. Office visit when necessary.

## 2016-11-22 NOTE — Progress Notes (Signed)
cc'ed to pcp °

## 2016-11-22 NOTE — Progress Notes (Signed)
      Primary Care Physician: Celene Squibb, MD  Primary Gastroenterologist:  Garfield Cornea, MD   Chief Complaint  Patient presents with  . Hemorrhoids    some bleeding  . Constipation    occ    HPI: [name] is a 82 y.o. female here for procedure follow-up. She was seen in March with complaints of recent onset GERD with severe heartburn failing multiple over the counter antacids. She has chronic constipation with intermittent rectal bleeding as well. Previous history of adenomatous colon polyps.  She underwent an EGD and colonoscopy in April. EGD was normal. She had multiple colonic diverticula, medium-sized grade 2 internal hemorrhoids.  Clinically she is doing very well. She states her upper GI symptoms completely resolved. She is not on a PPI. She's managing her constipation with daily Benefiber and MiraLAX as needed. Occasional bright red blood per rectum from her hemorrhoids. No rectal pain. No abdominal pain. Appetite is good.  Current Outpatient Prescriptions  Medication Sig Dispense Refill  . aspirin EC 81 MG tablet Take 81 mg by mouth daily.     . Calcium Carb-Cholecalciferol (CALCIUM 600 + D PO) Take 2 tablets by mouth daily.    . diphenhydrAMINE (BENADRYL) 25 MG tablet Take 25 mg by mouth at bedtime as needed for sleep.    . Magnesium 200 MG TABS Take 250 mg by mouth daily.     . Multiple Vitamin (MULTIVITAMIN WITH MINERALS) TABS tablet Take 1 tablet by mouth daily.    . naproxen sodium (ALEVE) 220 MG tablet Take 220 mg by mouth daily as needed (pain).    . polyethylene glycol (MIRALAX / GLYCOLAX) packet Take 17 g by mouth daily as needed.    . Wheat Dextrin (BENEFIBER) POWD Take by mouth. Takes 1 tbsp twice a day     No current facility-administered medications for this visit.     Allergies as of 11/22/2016 - Review Complete 11/22/2016  Allergen Reaction Noted  . Fish allergy Nausea And Vomiting 08/21/2016  . Shrimp [shellfish allergy] Nausea And Vomiting  08/21/2016    ROS:  General: Negative for anorexia, weight loss, fever, chills, fatigue, weakness. ENT: Negative for hoarseness, difficulty swallowing , nasal congestion. CV: Negative for chest pain, angina, palpitations, dyspnea on exertion, peripheral edema.  Respiratory: Negative for dyspnea at rest, dyspnea on exertion, cough, sputum, wheezing.  GI: See history of present illness. GU:  Negative for dysuria, hematuria, urinary incontinence, urinary frequency, nocturnal urination.  Endo: Negative for unusual weight change.    Physical Examination:   BP 136/68   Pulse 71   Temp (!) 97.1 F (36.2 C) (Oral)   Ht 5\' 8"  (1.727 m)   Wt 168 lb 6.4 oz (76.4 kg)   BMI 25.61 kg/m   General: Well-nourished, well-developed in no acute distress.  Eyes: No icterus. Mouth: Oropharyngeal mucosa moist and pink , no lesions erythema or exudate. Lungs: Clear to auscultation bilaterally.  Heart: Regular rate and rhythm, no murmurs rubs or gallops.  Abdomen: Bowel sounds are normal, nontender, nondistended, no hepatosplenomegaly or masses, no abdominal bruits or hernia , no rebound or guarding.   Extremities: No lower extremity edema. No clubbing or deformities. Neuro: Alert and oriented x 4   Skin: Warm and dry, no jaundice.   Psych: Alert and cooperative, normal mood and affect.

## 2016-11-22 NOTE — Assessment & Plan Note (Signed)
No longer on PPI. Symptoms completely resolved. She is doing great. Office visit as needed.

## 2017-01-14 ENCOUNTER — Other Ambulatory Visit (HOSPITAL_COMMUNITY): Payer: Self-pay | Admitting: Internal Medicine

## 2017-01-14 DIAGNOSIS — Z6826 Body mass index (BMI) 26.0-26.9, adult: Secondary | ICD-10-CM | POA: Diagnosis not present

## 2017-01-14 DIAGNOSIS — H538 Other visual disturbances: Secondary | ICD-10-CM | POA: Diagnosis not present

## 2017-01-14 DIAGNOSIS — R300 Dysuria: Secondary | ICD-10-CM | POA: Diagnosis not present

## 2017-01-14 DIAGNOSIS — Z1231 Encounter for screening mammogram for malignant neoplasm of breast: Secondary | ICD-10-CM

## 2017-01-16 ENCOUNTER — Ambulatory Visit (HOSPITAL_COMMUNITY): Payer: PPO

## 2017-02-01 ENCOUNTER — Ambulatory Visit (HOSPITAL_COMMUNITY)
Admission: RE | Admit: 2017-02-01 | Discharge: 2017-02-01 | Disposition: A | Discharge status: Home / Self Care | Payer: PPO | Source: Ambulatory Visit | Attending: Internal Medicine | Admitting: Internal Medicine

## 2017-02-01 DIAGNOSIS — Z1231 Encounter for screening mammogram for malignant neoplasm of breast: Secondary | ICD-10-CM | POA: Insufficient documentation

## 2017-02-05 DIAGNOSIS — E782 Mixed hyperlipidemia: Secondary | ICD-10-CM | POA: Diagnosis not present

## 2017-02-05 DIAGNOSIS — R7301 Impaired fasting glucose: Secondary | ICD-10-CM | POA: Diagnosis not present

## 2017-02-06 DIAGNOSIS — M150 Primary generalized (osteo)arthritis: Secondary | ICD-10-CM | POA: Diagnosis not present

## 2017-02-06 DIAGNOSIS — M353 Polymyalgia rheumatica: Secondary | ICD-10-CM | POA: Diagnosis not present

## 2017-02-06 DIAGNOSIS — E663 Overweight: Secondary | ICD-10-CM | POA: Diagnosis not present

## 2017-02-06 DIAGNOSIS — Z6826 Body mass index (BMI) 26.0-26.9, adult: Secondary | ICD-10-CM | POA: Diagnosis not present

## 2017-02-06 DIAGNOSIS — M810 Age-related osteoporosis without current pathological fracture: Secondary | ICD-10-CM | POA: Diagnosis not present

## 2017-02-07 DIAGNOSIS — M353 Polymyalgia rheumatica: Secondary | ICD-10-CM | POA: Diagnosis not present

## 2017-02-07 DIAGNOSIS — R7301 Impaired fasting glucose: Secondary | ICD-10-CM | POA: Diagnosis not present

## 2017-02-07 DIAGNOSIS — Z6825 Body mass index (BMI) 25.0-25.9, adult: Secondary | ICD-10-CM | POA: Diagnosis not present

## 2017-02-07 DIAGNOSIS — Z87440 Personal history of urinary (tract) infections: Secondary | ICD-10-CM | POA: Diagnosis not present

## 2017-02-07 DIAGNOSIS — M810 Age-related osteoporosis without current pathological fracture: Secondary | ICD-10-CM | POA: Diagnosis not present

## 2017-02-07 DIAGNOSIS — E782 Mixed hyperlipidemia: Secondary | ICD-10-CM | POA: Diagnosis not present

## 2017-02-07 DIAGNOSIS — Z23 Encounter for immunization: Secondary | ICD-10-CM | POA: Diagnosis not present

## 2017-02-20 DIAGNOSIS — M810 Age-related osteoporosis without current pathological fracture: Secondary | ICD-10-CM | POA: Diagnosis not present

## 2017-03-13 ENCOUNTER — Ambulatory Visit: Payer: PPO | Admitting: Urology

## 2017-03-13 ENCOUNTER — Other Ambulatory Visit (HOSPITAL_COMMUNITY)
Admission: RE | Admit: 2017-03-13 | Discharge: 2017-03-13 | Disposition: A | Discharge status: Home / Self Care | Payer: PPO | Source: Other Acute Inpatient Hospital | Attending: Urology | Admitting: Urology

## 2017-03-13 DIAGNOSIS — N3021 Other chronic cystitis with hematuria: Secondary | ICD-10-CM

## 2017-03-15 ENCOUNTER — Other Ambulatory Visit: Payer: Self-pay | Admitting: Urology

## 2017-03-15 DIAGNOSIS — N3021 Other chronic cystitis with hematuria: Secondary | ICD-10-CM

## 2017-03-16 LAB — URINE CULTURE: Culture: >=100000 — AB

## 2017-04-04 ENCOUNTER — Ambulatory Visit (HOSPITAL_COMMUNITY)
Admission: RE | Admit: 2017-04-04 | Discharge: 2017-04-04 | Disposition: A | Discharge status: Home / Self Care | Payer: PPO | Source: Ambulatory Visit | Attending: Urology | Admitting: Urology

## 2017-04-04 DIAGNOSIS — I2510 Atherosclerotic heart disease of native coronary artery without angina pectoris: Secondary | ICD-10-CM | POA: Insufficient documentation

## 2017-04-04 DIAGNOSIS — K449 Diaphragmatic hernia without obstruction or gangrene: Secondary | ICD-10-CM | POA: Insufficient documentation

## 2017-04-04 DIAGNOSIS — N3021 Other chronic cystitis with hematuria: Secondary | ICD-10-CM | POA: Diagnosis not present

## 2017-04-04 DIAGNOSIS — R319 Hematuria, unspecified: Secondary | ICD-10-CM | POA: Diagnosis not present

## 2017-04-04 DIAGNOSIS — I700 Atherosclerosis of aorta: Secondary | ICD-10-CM | POA: Insufficient documentation

## 2017-04-04 DIAGNOSIS — Z90710 Acquired absence of both cervix and uterus: Secondary | ICD-10-CM | POA: Insufficient documentation

## 2017-04-04 DIAGNOSIS — K5730 Diverticulosis of large intestine without perforation or abscess without bleeding: Secondary | ICD-10-CM | POA: Insufficient documentation

## 2017-04-04 LAB — POCT I-STAT CREATININE: Creatinine, Ser: 0.7 mg/dL (ref 0.44–1.00)

## 2017-04-04 MED ORDER — IOPAMIDOL (ISOVUE-300) INJECTION 61%
125.0000 mL | Freq: Once | INTRAVENOUS | Status: AC | PRN
  Administered 2017-04-04: 125 mL via INTRAVENOUS

## 2017-05-07 HISTORY — PX: BREAST LUMPECTOMY: SHX2

## 2017-05-08 ENCOUNTER — Ambulatory Visit (INDEPENDENT_AMBULATORY_CARE_PROVIDER_SITE_OTHER): Payer: PPO | Admitting: Urology

## 2017-05-08 DIAGNOSIS — N3021 Other chronic cystitis with hematuria: Secondary | ICD-10-CM | POA: Diagnosis not present

## 2017-08-01 DIAGNOSIS — E559 Vitamin D deficiency, unspecified: Secondary | ICD-10-CM | POA: Diagnosis not present

## 2017-08-01 DIAGNOSIS — R7301 Impaired fasting glucose: Secondary | ICD-10-CM | POA: Diagnosis not present

## 2017-08-01 DIAGNOSIS — E782 Mixed hyperlipidemia: Secondary | ICD-10-CM | POA: Diagnosis not present

## 2017-08-05 DIAGNOSIS — R030 Elevated blood-pressure reading, without diagnosis of hypertension: Secondary | ICD-10-CM | POA: Diagnosis not present

## 2017-08-05 DIAGNOSIS — M810 Age-related osteoporosis without current pathological fracture: Secondary | ICD-10-CM | POA: Diagnosis not present

## 2017-08-05 DIAGNOSIS — M353 Polymyalgia rheumatica: Secondary | ICD-10-CM | POA: Diagnosis not present

## 2017-08-05 DIAGNOSIS — E782 Mixed hyperlipidemia: Secondary | ICD-10-CM | POA: Diagnosis not present

## 2017-08-05 DIAGNOSIS — Z6826 Body mass index (BMI) 26.0-26.9, adult: Secondary | ICD-10-CM | POA: Diagnosis not present

## 2017-08-05 DIAGNOSIS — R7301 Impaired fasting glucose: Secondary | ICD-10-CM | POA: Diagnosis not present

## 2017-08-05 DIAGNOSIS — R946 Abnormal results of thyroid function studies: Secondary | ICD-10-CM | POA: Diagnosis not present

## 2017-08-05 DIAGNOSIS — N390 Urinary tract infection, site not specified: Secondary | ICD-10-CM | POA: Diagnosis not present

## 2017-09-04 DIAGNOSIS — R030 Elevated blood-pressure reading, without diagnosis of hypertension: Secondary | ICD-10-CM | POA: Diagnosis not present

## 2017-09-04 DIAGNOSIS — R946 Abnormal results of thyroid function studies: Secondary | ICD-10-CM | POA: Diagnosis not present

## 2017-09-04 DIAGNOSIS — E782 Mixed hyperlipidemia: Secondary | ICD-10-CM | POA: Diagnosis not present

## 2017-09-04 DIAGNOSIS — N390 Urinary tract infection, site not specified: Secondary | ICD-10-CM | POA: Diagnosis not present

## 2017-09-04 DIAGNOSIS — M353 Polymyalgia rheumatica: Secondary | ICD-10-CM | POA: Diagnosis not present

## 2017-09-04 DIAGNOSIS — M810 Age-related osteoporosis without current pathological fracture: Secondary | ICD-10-CM | POA: Diagnosis not present

## 2017-09-04 DIAGNOSIS — R300 Dysuria: Secondary | ICD-10-CM | POA: Diagnosis not present

## 2017-09-04 DIAGNOSIS — R7301 Impaired fasting glucose: Secondary | ICD-10-CM | POA: Diagnosis not present

## 2017-09-04 DIAGNOSIS — Z6826 Body mass index (BMI) 26.0-26.9, adult: Secondary | ICD-10-CM | POA: Diagnosis not present

## 2017-09-18 DIAGNOSIS — Z6825 Body mass index (BMI) 25.0-25.9, adult: Secondary | ICD-10-CM | POA: Diagnosis not present

## 2017-09-18 DIAGNOSIS — N390 Urinary tract infection, site not specified: Secondary | ICD-10-CM | POA: Diagnosis not present

## 2017-09-18 DIAGNOSIS — R946 Abnormal results of thyroid function studies: Secondary | ICD-10-CM | POA: Diagnosis not present

## 2017-09-18 DIAGNOSIS — M810 Age-related osteoporosis without current pathological fracture: Secondary | ICD-10-CM | POA: Diagnosis not present

## 2017-09-18 DIAGNOSIS — R7301 Impaired fasting glucose: Secondary | ICD-10-CM | POA: Diagnosis not present

## 2017-09-18 DIAGNOSIS — E782 Mixed hyperlipidemia: Secondary | ICD-10-CM | POA: Diagnosis not present

## 2017-09-18 DIAGNOSIS — R300 Dysuria: Secondary | ICD-10-CM | POA: Diagnosis not present

## 2017-09-18 DIAGNOSIS — R030 Elevated blood-pressure reading, without diagnosis of hypertension: Secondary | ICD-10-CM | POA: Diagnosis not present

## 2017-09-18 DIAGNOSIS — M353 Polymyalgia rheumatica: Secondary | ICD-10-CM | POA: Diagnosis not present

## 2017-09-18 DIAGNOSIS — Z6826 Body mass index (BMI) 26.0-26.9, adult: Secondary | ICD-10-CM | POA: Diagnosis not present

## 2017-12-03 DIAGNOSIS — Z6825 Body mass index (BMI) 25.0-25.9, adult: Secondary | ICD-10-CM | POA: Diagnosis not present

## 2017-12-03 DIAGNOSIS — N390 Urinary tract infection, site not specified: Secondary | ICD-10-CM | POA: Diagnosis not present

## 2017-12-03 DIAGNOSIS — R300 Dysuria: Secondary | ICD-10-CM | POA: Diagnosis not present

## 2017-12-03 DIAGNOSIS — R030 Elevated blood-pressure reading, without diagnosis of hypertension: Secondary | ICD-10-CM | POA: Diagnosis not present

## 2017-12-03 DIAGNOSIS — R946 Abnormal results of thyroid function studies: Secondary | ICD-10-CM | POA: Diagnosis not present

## 2017-12-03 DIAGNOSIS — M353 Polymyalgia rheumatica: Secondary | ICD-10-CM | POA: Diagnosis not present

## 2017-12-03 DIAGNOSIS — R7301 Impaired fasting glucose: Secondary | ICD-10-CM | POA: Diagnosis not present

## 2017-12-03 DIAGNOSIS — E782 Mixed hyperlipidemia: Secondary | ICD-10-CM | POA: Diagnosis not present

## 2017-12-03 DIAGNOSIS — M810 Age-related osteoporosis without current pathological fracture: Secondary | ICD-10-CM | POA: Diagnosis not present

## 2017-12-03 DIAGNOSIS — M545 Low back pain: Secondary | ICD-10-CM | POA: Diagnosis not present

## 2017-12-03 DIAGNOSIS — Z6826 Body mass index (BMI) 26.0-26.9, adult: Secondary | ICD-10-CM | POA: Diagnosis not present

## 2017-12-17 DIAGNOSIS — R300 Dysuria: Secondary | ICD-10-CM | POA: Diagnosis not present

## 2017-12-17 DIAGNOSIS — D72829 Elevated white blood cell count, unspecified: Secondary | ICD-10-CM | POA: Diagnosis not present

## 2017-12-17 DIAGNOSIS — Z6826 Body mass index (BMI) 26.0-26.9, adult: Secondary | ICD-10-CM | POA: Diagnosis not present

## 2018-01-08 ENCOUNTER — Other Ambulatory Visit (HOSPITAL_COMMUNITY): Payer: Self-pay | Admitting: Internal Medicine

## 2018-01-08 DIAGNOSIS — Z1231 Encounter for screening mammogram for malignant neoplasm of breast: Secondary | ICD-10-CM

## 2018-02-03 ENCOUNTER — Ambulatory Visit (HOSPITAL_COMMUNITY)
Admission: RE | Admit: 2018-02-03 | Discharge: 2018-02-03 | Disposition: A | Discharge status: Home / Self Care | Payer: PPO | Source: Ambulatory Visit | Attending: Internal Medicine | Admitting: Internal Medicine

## 2018-02-03 DIAGNOSIS — M353 Polymyalgia rheumatica: Secondary | ICD-10-CM | POA: Diagnosis not present

## 2018-02-03 DIAGNOSIS — N390 Urinary tract infection, site not specified: Secondary | ICD-10-CM | POA: Diagnosis not present

## 2018-02-03 DIAGNOSIS — Z1231 Encounter for screening mammogram for malignant neoplasm of breast: Secondary | ICD-10-CM | POA: Insufficient documentation

## 2018-02-03 DIAGNOSIS — R030 Elevated blood-pressure reading, without diagnosis of hypertension: Secondary | ICD-10-CM | POA: Diagnosis not present

## 2018-02-03 DIAGNOSIS — R7301 Impaired fasting glucose: Secondary | ICD-10-CM | POA: Diagnosis not present

## 2018-02-03 DIAGNOSIS — R946 Abnormal results of thyroid function studies: Secondary | ICD-10-CM | POA: Diagnosis not present

## 2018-02-03 DIAGNOSIS — E782 Mixed hyperlipidemia: Secondary | ICD-10-CM | POA: Diagnosis not present

## 2018-02-03 DIAGNOSIS — D72829 Elevated white blood cell count, unspecified: Secondary | ICD-10-CM | POA: Diagnosis not present

## 2018-02-03 DIAGNOSIS — M810 Age-related osteoporosis without current pathological fracture: Secondary | ICD-10-CM | POA: Diagnosis not present

## 2018-02-04 DIAGNOSIS — C50919 Malignant neoplasm of unspecified site of unspecified female breast: Secondary | ICD-10-CM

## 2018-02-04 DIAGNOSIS — C801 Malignant (primary) neoplasm, unspecified: Secondary | ICD-10-CM

## 2018-02-04 HISTORY — PX: BREAST BIOPSY: SHX20

## 2018-02-04 HISTORY — DX: Malignant neoplasm of unspecified site of unspecified female breast: C50.919

## 2018-02-04 HISTORY — DX: Malignant (primary) neoplasm, unspecified: C80.1

## 2018-02-06 ENCOUNTER — Other Ambulatory Visit (HOSPITAL_COMMUNITY): Payer: Self-pay | Admitting: Internal Medicine

## 2018-02-06 DIAGNOSIS — M353 Polymyalgia rheumatica: Secondary | ICD-10-CM | POA: Diagnosis not present

## 2018-02-06 DIAGNOSIS — M810 Age-related osteoporosis without current pathological fracture: Secondary | ICD-10-CM | POA: Diagnosis not present

## 2018-02-06 DIAGNOSIS — Z6827 Body mass index (BMI) 27.0-27.9, adult: Secondary | ICD-10-CM | POA: Diagnosis not present

## 2018-02-06 DIAGNOSIS — Z79899 Other long term (current) drug therapy: Secondary | ICD-10-CM | POA: Diagnosis not present

## 2018-02-06 DIAGNOSIS — E663 Overweight: Secondary | ICD-10-CM | POA: Diagnosis not present

## 2018-02-06 DIAGNOSIS — M150 Primary generalized (osteo)arthritis: Secondary | ICD-10-CM | POA: Diagnosis not present

## 2018-02-06 DIAGNOSIS — R928 Other abnormal and inconclusive findings on diagnostic imaging of breast: Secondary | ICD-10-CM

## 2018-02-07 DIAGNOSIS — D72829 Elevated white blood cell count, unspecified: Secondary | ICD-10-CM | POA: Diagnosis not present

## 2018-02-07 DIAGNOSIS — R7301 Impaired fasting glucose: Secondary | ICD-10-CM | POA: Diagnosis not present

## 2018-02-07 DIAGNOSIS — M353 Polymyalgia rheumatica: Secondary | ICD-10-CM | POA: Diagnosis not present

## 2018-02-07 DIAGNOSIS — E782 Mixed hyperlipidemia: Secondary | ICD-10-CM | POA: Diagnosis not present

## 2018-02-07 DIAGNOSIS — R030 Elevated blood-pressure reading, without diagnosis of hypertension: Secondary | ICD-10-CM | POA: Diagnosis not present

## 2018-02-11 ENCOUNTER — Ambulatory Visit (HOSPITAL_COMMUNITY)
Admission: RE | Admit: 2018-02-11 | Discharge: 2018-02-11 | Disposition: A | Discharge status: Home / Self Care | Payer: PPO | Source: Ambulatory Visit | Attending: Internal Medicine | Admitting: Internal Medicine

## 2018-02-11 ENCOUNTER — Other Ambulatory Visit (HOSPITAL_COMMUNITY): Payer: Self-pay | Admitting: Internal Medicine

## 2018-02-11 DIAGNOSIS — R928 Other abnormal and inconclusive findings on diagnostic imaging of breast: Secondary | ICD-10-CM | POA: Diagnosis present

## 2018-02-11 DIAGNOSIS — N6314 Unspecified lump in the right breast, lower inner quadrant: Secondary | ICD-10-CM | POA: Insufficient documentation

## 2018-02-13 DIAGNOSIS — E782 Mixed hyperlipidemia: Secondary | ICD-10-CM | POA: Diagnosis not present

## 2018-02-13 DIAGNOSIS — Z6826 Body mass index (BMI) 26.0-26.9, adult: Secondary | ICD-10-CM | POA: Diagnosis not present

## 2018-02-13 DIAGNOSIS — M19049 Primary osteoarthritis, unspecified hand: Secondary | ICD-10-CM | POA: Diagnosis not present

## 2018-02-13 DIAGNOSIS — R7301 Impaired fasting glucose: Secondary | ICD-10-CM | POA: Diagnosis not present

## 2018-02-13 DIAGNOSIS — R030 Elevated blood-pressure reading, without diagnosis of hypertension: Secondary | ICD-10-CM | POA: Diagnosis not present

## 2018-02-13 DIAGNOSIS — M810 Age-related osteoporosis without current pathological fracture: Secondary | ICD-10-CM | POA: Diagnosis not present

## 2018-02-13 DIAGNOSIS — N390 Urinary tract infection, site not specified: Secondary | ICD-10-CM | POA: Diagnosis not present

## 2018-02-13 DIAGNOSIS — Z0000 Encounter for general adult medical examination without abnormal findings: Secondary | ICD-10-CM | POA: Diagnosis not present

## 2018-02-13 DIAGNOSIS — M353 Polymyalgia rheumatica: Secondary | ICD-10-CM | POA: Diagnosis not present

## 2018-02-13 DIAGNOSIS — H6122 Impacted cerumen, left ear: Secondary | ICD-10-CM | POA: Diagnosis not present

## 2018-02-13 DIAGNOSIS — R946 Abnormal results of thyroid function studies: Secondary | ICD-10-CM | POA: Diagnosis not present

## 2018-02-18 ENCOUNTER — Ambulatory Visit (HOSPITAL_COMMUNITY)
Admission: RE | Admit: 2018-02-18 | Discharge: 2018-02-18 | Disposition: A | Discharge status: Home / Self Care | Payer: PPO | Source: Ambulatory Visit | Attending: Internal Medicine | Admitting: Internal Medicine

## 2018-02-18 ENCOUNTER — Encounter (HOSPITAL_COMMUNITY): Payer: Self-pay

## 2018-02-18 ENCOUNTER — Other Ambulatory Visit (HOSPITAL_COMMUNITY): Payer: Self-pay | Admitting: Internal Medicine

## 2018-02-18 DIAGNOSIS — R928 Other abnormal and inconclusive findings on diagnostic imaging of breast: Secondary | ICD-10-CM | POA: Diagnosis present

## 2018-02-18 DIAGNOSIS — N6314 Unspecified lump in the right breast, lower inner quadrant: Secondary | ICD-10-CM | POA: Diagnosis not present

## 2018-02-18 DIAGNOSIS — C50311 Malignant neoplasm of lower-inner quadrant of right female breast: Secondary | ICD-10-CM | POA: Diagnosis not present

## 2018-02-18 DIAGNOSIS — C50211 Malignant neoplasm of upper-inner quadrant of right female breast: Secondary | ICD-10-CM | POA: Insufficient documentation

## 2018-02-18 MED ORDER — LIDOCAINE HCL (PF) 1 % IJ SOLN
INTRAMUSCULAR | Status: AC
  Administered 2018-02-18: 5 mL
  Filled 2018-02-18: qty 5

## 2018-02-18 MED ORDER — LIDOCAINE-EPINEPHRINE (PF) 1 %-1:200000 IJ SOLN
INTRAMUSCULAR | Status: AC
  Administered 2018-02-18: 5 mL
  Filled 2018-02-18: qty 30

## 2018-02-24 DIAGNOSIS — M810 Age-related osteoporosis without current pathological fracture: Secondary | ICD-10-CM | POA: Diagnosis not present

## 2018-02-27 ENCOUNTER — Other Ambulatory Visit: Payer: Self-pay | Admitting: General Surgery

## 2018-02-27 DIAGNOSIS — M353 Polymyalgia rheumatica: Secondary | ICD-10-CM | POA: Diagnosis not present

## 2018-02-27 DIAGNOSIS — E785 Hyperlipidemia, unspecified: Secondary | ICD-10-CM | POA: Diagnosis not present

## 2018-02-27 DIAGNOSIS — C50311 Malignant neoplasm of lower-inner quadrant of right female breast: Secondary | ICD-10-CM

## 2018-02-27 DIAGNOSIS — Z9049 Acquired absence of other specified parts of digestive tract: Secondary | ICD-10-CM | POA: Diagnosis not present

## 2018-02-27 DIAGNOSIS — Z90710 Acquired absence of both cervix and uterus: Secondary | ICD-10-CM | POA: Diagnosis not present

## 2018-03-05 ENCOUNTER — Encounter (HOSPITAL_BASED_OUTPATIENT_CLINIC_OR_DEPARTMENT_OTHER): Payer: Self-pay | Admitting: *Deleted

## 2018-03-05 ENCOUNTER — Encounter (HOSPITAL_BASED_OUTPATIENT_CLINIC_OR_DEPARTMENT_OTHER)
Admission: RE | Admit: 2018-03-05 | Discharge: 2018-03-05 | Disposition: A | Discharge status: Home / Self Care | Payer: PPO | Source: Ambulatory Visit | Attending: General Surgery | Admitting: General Surgery

## 2018-03-05 ENCOUNTER — Other Ambulatory Visit: Payer: Self-pay

## 2018-03-05 DIAGNOSIS — Z01818 Encounter for other preprocedural examination: Secondary | ICD-10-CM | POA: Insufficient documentation

## 2018-03-05 DIAGNOSIS — C50311 Malignant neoplasm of lower-inner quadrant of right female breast: Secondary | ICD-10-CM | POA: Insufficient documentation

## 2018-03-05 LAB — CBC WITH DIFFERENTIAL/PLATELET
Abs Immature Granulocytes: 0.05 10*3/uL (ref 0.00–0.07)
Basophils Absolute: 0 10*3/uL (ref 0.0–0.1)
Basophils Relative: 1 %
Eosinophils Absolute: 0.1 10*3/uL (ref 0.0–0.5)
Eosinophils Relative: 1 %
HCT: 40.3 % (ref 36.0–46.0)
Hemoglobin: 13.2 g/dL (ref 12.0–15.0)
Immature Granulocytes: 1 %
Lymphocytes Relative: 17 %
Lymphs Abs: 1.1 10*3/uL (ref 0.7–4.0)
MCH: 30.2 pg (ref 26.0–34.0)
MCHC: 32.8 g/dL (ref 30.0–36.0)
MCV: 92.2 fL (ref 80.0–100.0)
Monocytes Absolute: 0.4 10*3/uL (ref 0.1–1.0)
Monocytes Relative: 6 %
Neutro Abs: 4.6 10*3/uL (ref 1.7–7.7)
Neutrophils Relative %: 74 %
Platelets: 257 10*3/uL (ref 150–400)
RBC: 4.37 MIL/uL (ref 3.87–5.11)
RDW: 12.8 % (ref 11.5–15.5)
WBC: 6.1 10*3/uL (ref 4.0–10.5)
nRBC: 0 % (ref 0.0–0.2)

## 2018-03-05 LAB — COMPREHENSIVE METABOLIC PANEL
ALT: 19 U/L (ref 0–44)
AST: 22 U/L (ref 15–41)
Albumin: 3.8 g/dL (ref 3.5–5.0)
Alkaline Phosphatase: 65 U/L (ref 38–126)
Anion gap: 5 (ref 5–15)
BUN: 11 mg/dL (ref 8–23)
CO2: 27 mmol/L (ref 22–32)
Calcium: 9.1 mg/dL (ref 8.9–10.3)
Chloride: 107 mmol/L (ref 98–111)
Creatinine, Ser: 0.72 mg/dL (ref 0.44–1.00)
GFR calc Af Amer: >60 mL/min (ref >60)
GFR calc non Af Amer: >60 mL/min (ref >60)
Glucose, Bld: 94 mg/dL (ref 70–99)
Potassium: 4.5 mmol/L (ref 3.5–5.1)
Sodium: 139 mmol/L (ref 135–145)
Total Bilirubin: 0.9 mg/dL (ref 0.3–1.2)
Total Protein: 6.4 g/dL — ABNORMAL LOW (ref 6.5–8.1)

## 2018-03-05 NOTE — Progress Notes (Signed)
Ensure pre surgery drink given with instructions to complete by 0800 dos, pt verbalized understanding. 

## 2018-03-09 NOTE — H&P (Signed)
[name] Location: [facility] Patient #: [id number] DOB: 02-18-1935 Widowed / Language: Cleophus Molt / Race: White Female       History of Present Illness         This is an 83 year old female from [place]. PCP is [facility]. She is referred for management of right breast cancer, lower inner quadrant. Her daughter-in-law is with her throughout the encounter      She has no prior breast problems. Recent screening mammogram show an area of distortion in the right breast, 3:30 position, lower inner quadrant, 2 cm from the nipple, 5 mm diameter. 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 The pathologist thought this might be in situ cancer in a papillary lesion or intracystic papillary ductal carcinoma. Hormone receptors are pending. She's had no problems since the biopsy She says that Dr. Nevada Crane referred her to me and told her she was going to see medical oncology and radiation oncology      Past history reveals vaginal hysterectomy. She states ovaries were not removed. Open cholecystectomy. Polymyalgia rheumatica. Colon polyps. Hyperlipidemia. GERD. Takes aspirin. Family history reveals mother had thyroid cancer. There is no family history of breast or ovarian cancer. Social history reveals she is a widow. [place] 2 sons who live nearby. Denies tobacco. Takes alcohol occasionally      We had a very long conversation close to one hour for discussion of management of her breast cancer. I told her this was probably a low-grade tumor with low metastatic potential. From a surgical standpoint we discussed lumpectomy, radiation therapy, anti-estrogen therapy. We compare that to mastectomy with or without reconstruction. I told her I did not believe there is any survival advantage to mastectomy. Given her age and clinically negative axilla and the low-grade nature the tumor I do not  think she would benefit from sentinel lymph node biopsy. She prefers breast conservation and I think she is a good candidate for that She'll be scheduled for right breast lumpectomy with radioactive seed localization. I discussed the indications, details, techniques, and risk of the surgery in detail. She is aware of the risk of bleeding, infection, reoperation for positive margins, cosmetic deformity, chronic pain. She understands all of these issues. All her questions are answered. She agrees with this plan.  Plan: Schedule for right breast lumpectomy with radioactive seed localization I do not think she would benefit from with lymph node biopsy Refer medical oncology and radiation oncology now Refer breast conference    Past Surgical History Cataract Surgery  Bilateral. Gallbladder Surgery - Open  Tonsillectomy   Diagnostic Studies History  Colonoscopy  1-5 years ago Mammogram  within last year  Allergies  No Known Drug Allergies  Allergies Reconciled   Medication History  Cephalexin (250MG  Capsule, Oral) Active. predniSONE (5MG  Tablet, Oral) Active. Medications Reconciled One Daily Womens 50 Plus (Oral) Active.  Social History Alcohol use  Occasional alcohol use. Caffeine use  Coffee. No drug use  Tobacco use  Never Dougherty.  Family History  Arthritis  Brother. Thyroid problems  Mother.  Pregnancy / Birth History Age at menarche  24 years. Age of menopause  <47 Contraceptive History  Oral contraceptives. Gravida  2 Maternal age  57-25  Other Problems  Breast Cancer  Hemorrhoids     Review of Systems General Not Present- Appetite Loss, Chills, Fatigue, Fever, Night Sweats, Weight Gain and Weight Loss. Skin Not Present- Change in Wart/Mole, Dryness, Hives,  Jaundice, New Lesions, Non-Healing Wounds, Rash and Ulcer. HEENT Present- Ringing in the Ears and Wears glasses/contact lenses. Not Present- Earache,  Hearing Loss, Hoarseness, Nose Bleed, Oral Ulcers, Seasonal Allergies, Sinus Pain, Sore Throat, Visual Disturbances and Yellow Eyes. Respiratory Not Present- Bloody sputum, Chronic Cough, Difficulty Breathing, Snoring and Wheezing. Breast Present- Breast Mass. Not Present- Breast Pain, Nipple Discharge and Skin Changes. Cardiovascular Not Present- Chest Pain, Difficulty Breathing Lying Down, Leg Cramps, Palpitations, Rapid Heart Rate, Shortness of Breath and Swelling of Extremities. Gastrointestinal Present- Constipation. Not Present- Abdominal Pain, Bloating, Bloody Stool, Change in Bowel Habits, Chronic diarrhea, Difficulty Swallowing, Excessive gas, Gets full quickly at meals, Hemorrhoids, Indigestion, Nausea, Rectal Pain and Vomiting. Female Genitourinary Present- Frequency. Not Present- Nocturia, Painful Urination, Pelvic Pain and Urgency. Musculoskeletal Present- Joint Pain. Not Present- Back Pain, Joint Stiffness, Muscle Pain, Muscle Weakness and Swelling of Extremities. Psychiatric Not Present- Anxiety, Bipolar, Change in Sleep Pattern, Depression, Fearful and Frequent crying. Endocrine Not Present- Cold Intolerance, Excessive Hunger, Hair Changes, Heat Intolerance, Hot flashes and New Diabetes.  Vitals  Weight: 171.13 lb Height: 68in Body Surface Area: 1.91 m Body Mass Index: 26.02 kg/m  Temp.: 97.75F  Pulse: 86 (Regular)  P.OX: 97% (Room air) BP: 158/84 (Sitting, Left Arm, Standard)       Physical Exam  General Mental Status-Alert. General Appearance-Consistent with stated age. Hydration-Well hydrated. Voice-Normal. Note: Alert. Mental status normal. Excellent performance status. Appears fit for her age   Head and Neck Head-normocephalic, atraumatic with no lesions or palpable masses. Trachea-midline. Thyroid Gland Characteristics - normal size and consistency.  Eye Eyeball - Bilateral-Extraocular movements intact. Sclera/Conjunctiva -  Bilateral-No scleral icterus.  Chest and Lung Exam Chest and lung exam reveals -quiet, even and easy respiratory effort with no use of accessory muscles and on auscultation, normal breath sounds, no adventitious sounds and normal vocal resonance. Inspection Chest Wall - Normal. Back - normal.  Breast Note: Breasts are medium size. Slightly atrophic but not tiny. Breast tissues are soft without palpable mass. No hematoma or significant ecchymoses. Skin is healthy. No axillary adenopathy on either side   Cardiovascular Cardiovascular examination reveals -normal heart sounds, regular rate and rhythm with no murmurs and normal pedal pulses bilaterally.  Abdomen Inspection Inspection of the abdomen reveals - No Hernias. Skin - Scar - Note: Right subcostal incision healed without hernia. Palpation/Percussion Palpation and Percussion of the abdomen reveal - Soft, Non Tender, No Rebound tenderness, No Rigidity (guarding) and No hepatosplenomegaly. Auscultation Auscultation of the abdomen reveals - Bowel sounds normal.  Neurologic Neurologic evaluation reveals -alert and oriented x 3 with no impairment of recent or remote memory. Mental Status-Normal.  Musculoskeletal Normal Exam - Left-Upper Extremity Strength Normal and Lower Extremity Strength Normal. Normal Exam - Right-Upper Extremity Strength Normal and Lower Extremity Strength Normal.  Lymphatic Head & Neck  General Head & Neck Lymphatics: Bilateral - Description - Normal. Axillary  General Axillary Region: Bilateral - Description - Normal. Tenderness - Non Tender. Femoral & Inguinal  Generalized Femoral & Inguinal Lymphatics: Bilateral - Description - Normal. Tenderness - Non Tender.    Assessment & Plan  PRIMARY CANCER OF LOWER-INNER QUADRANT OF RIGHT FEMALE BREAST (C50.311)   Your recent imaging studies and biopsy show a low-grade breast cancer in the right breast, lower inner quadrant this may be an in  situ cancer or maybe a subtype of cancer: Intracystic papillary ductal carcinoma In any event this is probably a very slow growing tumor      Options  for surgical intervention include lumpectomy or mastectomy I do not think a mastectomy offers any survival advantage My bias is to do the lesser surgery, a right breast lumpectomy with radioactive seed localization I do not think you would benefit from any lymph node surgery You might need radiation therapy You might need antiestrogen therapy      you will also need to see medical oncology and radiation oncology, and you state that Dr. Wende Neighbors has referred you for these consultations      We will present your case at our citywide breast cancer at the next opportunity Please keep your appointments with medical oncology and radiation oncology We will go ahead and tentatively schedule you for a right breast lumpectomy with radioactive seed localization  POLYMYALGIA RHEUMATICA (M35.3) HYPERLIPIDEMIA, MILD (E78.5) H/O VAGINAL HYSTERECTOMY (Z90.710) HISTORY OF CHOLECYSTECTOMY (Z90.49) Impression: Open cholecystectomy    Edsel Petrin. Dalbert Batman, M.D., [facility] General and Minimally invasive Surgery Breast and Colorectal Surgery Office:   [phone] Pager:   [phone]

## 2018-03-10 ENCOUNTER — Inpatient Hospital Stay (HOSPITAL_COMMUNITY): Payer: PPO | Attending: Hematology | Admitting: Hematology

## 2018-03-10 ENCOUNTER — Other Ambulatory Visit: Payer: Self-pay

## 2018-03-10 ENCOUNTER — Encounter (HOSPITAL_COMMUNITY): Payer: Self-pay | Admitting: Hematology

## 2018-03-10 DIAGNOSIS — Z79899 Other long term (current) drug therapy: Secondary | ICD-10-CM | POA: Diagnosis not present

## 2018-03-10 DIAGNOSIS — Z7982 Long term (current) use of aspirin: Secondary | ICD-10-CM | POA: Diagnosis not present

## 2018-03-10 DIAGNOSIS — D0511 Intraductal carcinoma in situ of right breast: Secondary | ICD-10-CM | POA: Insufficient documentation

## 2018-03-10 DIAGNOSIS — Z90710 Acquired absence of both cervix and uterus: Secondary | ICD-10-CM | POA: Diagnosis not present

## 2018-03-10 NOTE — Assessment & Plan Note (Signed)
1.  Right breast DCIS: - Screening mammogram on 02/03/2018 was abnormal. -Ultrasound of the right breast on 02/11/2018 shows mass measuring 0.5 x 0.4 x 0.5 cm, circumscribed hypoechoic in the 3:30 o'clock location of the right breast.  Right axilla ultrasound was negative for adenopathy. - Right breast core biopsy on 02/18/2018 shows ductal carcinoma with the papillary configuration.  In the comment section, it notes that this may represented DCIS involving a papillary lesion or it may represent an intracystic papillary ductal carcinoma.  ER/PR was 95% positive. - I have called and talked to the pathologist Dr. Vic Ripper to clarify this.  He thinks this is a bit of DCIS on her and intracystic papillary ductal carcinoma, which is lower grade than invasive ductal carcinoma. - She is scheduled to have lumpectomy of the right breast done by Dr. Dalbert Batman on 03/12/2018. - I will see her back in 3 to 4 weeks after lumpectomy to discuss the final pathology and to see if she needs any antiestrogen therapy.

## 2018-03-10 NOTE — Progress Notes (Signed)
[facility] CONSULT NOTE  Patient Care Team: Celene Squibb, MD as PCP - General (Internal Medicine) Gala Romney Cristopher Estimable, MD (Gastroenterology)  CHIEF COMPLAINTS/PURPOSE OF CONSULTATION:  Newly diagnosed right breast DCIS.  HISTORY OF PRESENTING ILLNESS:  [name] 83 y.o. female is seen in consultation today for further work-up and management of right breast DCIS.  She had an abnormal mammogram on 02/03/2018.  This was followed by right breast ultrasound on 02/11/2018 which showed a 0.5 cm hypoechoic mass in the right breast at 3:30 o'clock position.  She denies any history of previous biopsies on the breast.  She underwent a biopsy on 02/18/2018 of the right breast mass.  This was consistent with ductal carcinoma with papillary configuration.  Patient met with Dr. Dalbert Batman yesterday and her lumpectomy is being planned this Wednesday.  She denies any family or personal history of breast cancer or ovarian cancer.  Her mother and 2 nieces had thyroid cancer.  She is accompanied by her daughter-in-law today.  She lives at home by herself and is independent of all ADLs and IADLs.  I reviewed her records extensively and collaborated the history with the patient.  In terms of breast cancer risk profile:  She menarched at early age of 32 and went to menopause her early 20s secondary to hysterectomy due to bleeding.   She had to pregnancies, her first child was born at age 8 She  received birth control pills for approximately 4-5 years.  She was never exposed to fertility medications or hormone replacement therapy.  She has no family history of Breast/GYN/GI cancer  MEDICAL HISTORY:  Past Medical History:  Diagnosis Date  . Bladder infection, chronic   . Cancer ([place]) 02/2018   right breast cancer  . Complication of anesthesia   . GERD (gastroesophageal reflux disease)   . Hemorrhoids   . Hypercholesterolemia   . Polymyalgia rheumatica ([place])   . PONV (postoperative nausea and  vomiting)   . S/P colonoscopy Jun 14, 2004   friable internal and external hemorrhoids, left-sided diverticula  . S/P endoscopy August 13, 2006   pale 1-3 mm nodules consistent with benign squamous papilloma, tiny hiatal hernia  . Serrated adenoma of colon     SURGICAL HISTORY: Past Surgical History:  Procedure Laterality Date  . ABDOMINAL HYSTERECTOMY    . CHOLECYSTECTOMY    . COLONOSCOPY  11/20/2010   Dr. Gala Romney- serrated adenomaL side diverticulosis, hemorrhoids  . COLONOSCOPY  06/14/04   friable internal and external hemorrhoids, left-sided diverticula  . COLONOSCOPY N/A 08/23/2016   Dr. Gala Romney: Diverticulosis, medium-sized grade 2 internal hemorrhoids.  . ESOPHAGOGASTRODUODENOSCOPY  08/13/06   pale 1-3 mm nodules consistent with benign squamous papilloma, tiny hiatal hernia  . ESOPHAGOGASTRODUODENOSCOPY N/A 08/23/2016   Dr. Gala Romney: Normal  . FLEXIBLE SIGMOIDOSCOPY  11/15/2011   Procedure: FLEXIBLE SIGMOIDOSCOPY;  Surgeon: Daneil Dolin, MD;  Location: [facility];  Service: Endoscopy;  Laterality: N/A;  12:30PM  . WRIST FRACTURE SURGERY Right     SOCIAL HISTORY: Social History   Socioeconomic History  . Marital status: Widowed    Spouse name: Not on file  . Number of children: 2  . Years of education: Not on file  . Highest education level: Not on file  Occupational History  . Not on file  Social Needs  . Financial resource strain: Not hard at all  . Food insecurity:    Worry: Never true    Inability: Never true  . Transportation needs:  Medical: No    Non-medical: No  Tobacco Use  . Smoking status: Never Smoker  . Smokeless tobacco: Never Used  Substance and Sexual Activity  . Alcohol use: Yes    Alcohol/week: 2.0 standard drinks    Types: 2 Glasses of wine per week    Comment: Drinks wine 2-3 days per week ( usually a couple of glasses each time)  . Drug use: No  . Sexual activity: Not on file  Lifestyle  . Physical activity:    Days per week: 0 days     Minutes per session: 0 min  . Stress: To some extent  Relationships  . Social connections:    Talks on phone: More than three times a week    Gets together: Three times a week    Attends religious service: More than 4 times per year    Active member of club or organization: No    Attends meetings of clubs or organizations: Never    Relationship status: Widowed  . Intimate partner violence:    Fear of current or ex partner: No    Emotionally abused: No    Physically abused: No    Forced sexual activity: No  Other Topics Concern  . Not on file  Social History Narrative  . Not on file    FAMILY HISTORY: Family History  Problem Relation Age of Onset  . Thyroid cancer Mother   . Parkinson's disease Sister   . Thyroid disease Brother   . Aneurysm Sister   . Thyroid cancer Other   . Thyroid cancer Other   . Colon cancer Neg Hx     ALLERGIES:  is allergic to fish allergy and shrimp [shellfish allergy].  MEDICATIONS:  Current Outpatient Medications  Medication Sig Dispense Refill  . aspirin EC 81 MG tablet Take 81 mg by mouth daily.     . Calcium Carb-Cholecalciferol (CALCIUM 600 + D PO) Take 1 tablet by mouth daily.     . cephALEXin (KEFLEX) 250 MG capsule Take 250 mg by mouth daily.     . Magnesium 200 MG TABS Take 250 mg by mouth daily.     . Multiple Vitamin (MULTIVITAMIN WITH MINERALS) TABS tablet Take 1 tablet by mouth daily.    . naproxen sodium (ALEVE) 220 MG tablet Take 220 mg by mouth daily as needed (pain).    . nitrofurantoin, macrocrystal-monohydrate, (MACROBID) 100 MG capsule     . polyethylene glycol (MIRALAX / GLYCOLAX) packet Take 17 g by mouth daily as needed.    . predniSONE (DELTASONE) 5 MG tablet Take 5 mg by mouth daily with breakfast.    . Wheat Dextrin (BENEFIBER) POWD Take by mouth daily. Takes 1 tbsp twice a day      No current facility-administered medications for this visit.     REVIEW OF SYSTEMS:   Constitutional: Denies fevers, chills or  abnormal night sweats Eyes: Denies blurriness of vision, double vision or watery eyes Ears, nose, mouth, throat, and face: Denies mucositis or sore throat Respiratory: Denies cough, dyspnea or wheezes Cardiovascular: Denies palpitation, chest discomfort or lower extremity swelling Gastrointestinal:  Denies nausea, heartburn or change in bowel habits Skin: Denies abnormal skin rashes Lymphatics: Denies new lymphadenopathy or easy bruising Neurological:Denies numbness, tingling or new weaknesses Behavioral/Psych: Mood is stable, no new changes  Breast:  Denies any palpable lumps or discharge All other systems were reviewed with the patient and are negative.  PHYSICAL EXAMINATION: ECOG PERFORMANCE STATUS: 1 - Symptomatic but completely  ambulatory  Vitals:   03/10/18 1315  BP: (!) 155/51  Pulse: 72  Resp: 18  Temp: 97.7 F (36.5 C)  SpO2: 93%   Filed Weights   03/10/18 1315  Weight: 173 lb 8 oz (78.7 kg)    GENERAL:alert, no distress and comfortable SKIN: skin color, texture, turgor are normal, no rashes or significant lesions EYES: normal, conjunctiva are pink and non-injected, sclera clear OROPHARYNX:no exudate, no erythema and lips, buccal mucosa, and tongue normal  NECK: supple, thyroid normal size, non-tender, without nodularity LYMPH:  no palpable lymphadenopathy in the cervical, axillary or inguinal LUNGS: clear to auscultation and percussion with normal breathing effort HEART: regular rate & rhythm and no murmurs and no lower extremity edema ABDOMEN:abdomen soft, non-tender and normal bowel sounds Musculoskeletal:no cyanosis of digits and no clubbing  PSYCH: alert & oriented x 3 with fluent speech NEURO: no focal motor/sensory deficits BREAST: No palpable masses in bilateral breast.  No palpable adenopathy.  bilateral nipple areolar complex within normal limits.  LABORATORY DATA:  I have reviewed the data as listed Lab Results  Component Value Date   WBC 6.1  03/05/2018   HGB 13.2 03/05/2018   HCT 40.3 03/05/2018   MCV 92.2 03/05/2018   PLT 257 03/05/2018   Lab Results  Component Value Date   NA 139 03/05/2018   K 4.5 03/05/2018   CL 107 03/05/2018   CO2 27 03/05/2018    RADIOGRAPHIC STUDIES: I have personally reviewed the radiological reports and agreed with the findings in the report.  ASSESSMENT AND PLAN:  Breast neoplasm, Tis (DCIS), right 1.  Right breast DCIS: - Screening mammogram on 02/03/2018 was abnormal. -Ultrasound of the right breast on 02/11/2018 shows mass measuring 0.5 x 0.4 x 0.5 cm, circumscribed hypoechoic in the 3:30 o'clock location of the right breast.  Right axilla ultrasound was negative for adenopathy. - Right breast core biopsy on 02/18/2018 shows ductal carcinoma with the papillary configuration.  In the comment section, it notes that this may represented DCIS involving a papillary lesion or it may represent an intracystic papillary ductal carcinoma.  ER/PR was 95% positive. - I have called and talked to the pathologist Dr. Vic Ripper to clarify this.  He thinks this is a bit of DCIS on her and intracystic papillary ductal carcinoma, which is lower grade than invasive ductal carcinoma. - She is scheduled to have lumpectomy of the right breast done by Dr. Dalbert Batman on 03/12/2018. - I will see her back in 3 to 4 weeks after lumpectomy to discuss the final pathology and to see if she needs any antiestrogen therapy.   All questions were answered. The patient knows to call the clinic with any problems, questions or concerns.    Derek Jack, MD 03/10/18

## 2018-03-10 NOTE — Patient Instructions (Signed)
[facility] at [facility] Discharge Instructions  Follow up in 4 weeks    Thank you for choosing [facility] at [facility] to provide your oncology and hematology care.  To afford each patient quality time with our provider, please arrive at least 15 minutes before your scheduled appointment time.   If you have a lab appointment with the Cancer Center please come in thru the  Main Entrance and check in at the main information desk  You need to re-schedule your appointment should you arrive 10 or more minutes late.  We strive to give you quality time with our providers, and arriving late affects you and other patients whose appointments are after yours.  Also, if you no show three or more times for appointments you may be dismissed from the clinic at the providers discretion.     Again, thank you for choosing [facility].  Our hope is that these requests will decrease the amount of time that you wait before being seen by our physicians.       _____________________________________________________________  Should you have questions after your visit to [facility], please contact our office at [phone] between the hours of 8:00 a.m. and 4:30 p.m.  Voicemails left after 4:00 p.m. will not be returned until the following business day.  For prescription refill requests, have your pharmacy contact our office and allow 72 hours.    Cancer Center Support Programs:   > Cancer Support Group  2nd Tuesday of the month 1pm-2pm, Journey Room    

## 2018-03-11 ENCOUNTER — Ambulatory Visit
Admission: RE | Admit: 2018-03-11 | Discharge: 2018-03-11 | Disposition: A | Discharge status: Home / Self Care | Payer: PPO | Source: Ambulatory Visit | Attending: General Surgery | Admitting: General Surgery

## 2018-03-11 DIAGNOSIS — C50311 Malignant neoplasm of lower-inner quadrant of right female breast: Secondary | ICD-10-CM

## 2018-03-11 DIAGNOSIS — N6314 Unspecified lump in the right breast, lower inner quadrant: Secondary | ICD-10-CM | POA: Diagnosis not present

## 2018-03-12 ENCOUNTER — Ambulatory Visit (HOSPITAL_BASED_OUTPATIENT_CLINIC_OR_DEPARTMENT_OTHER): Payer: PPO | Admitting: Anesthesiology

## 2018-03-12 ENCOUNTER — Other Ambulatory Visit: Payer: Self-pay

## 2018-03-12 ENCOUNTER — Encounter (HOSPITAL_BASED_OUTPATIENT_CLINIC_OR_DEPARTMENT_OTHER): Payer: Self-pay

## 2018-03-12 ENCOUNTER — Ambulatory Visit
Admission: RE | Admit: 2018-03-12 | Discharge: 2018-03-12 | Disposition: A | Discharge status: Home / Self Care | Payer: PPO | Source: Ambulatory Visit | Attending: General Surgery | Admitting: General Surgery

## 2018-03-12 ENCOUNTER — Encounter (HOSPITAL_BASED_OUTPATIENT_CLINIC_OR_DEPARTMENT_OTHER)
Admission: RE | Disposition: A | Discharge status: Home / Self Care | Payer: Self-pay | Source: Ambulatory Visit | Attending: General Surgery

## 2018-03-12 ENCOUNTER — Ambulatory Visit (HOSPITAL_BASED_OUTPATIENT_CLINIC_OR_DEPARTMENT_OTHER)
Admission: RE | Admit: 2018-03-12 | Discharge: 2018-03-12 | Disposition: A | Discharge status: Home / Self Care | Payer: PPO | Source: Ambulatory Visit | Attending: General Surgery | Admitting: General Surgery

## 2018-03-12 DIAGNOSIS — Z791 Long term (current) use of non-steroidal anti-inflammatories (NSAID): Secondary | ICD-10-CM | POA: Diagnosis not present

## 2018-03-12 DIAGNOSIS — R928 Other abnormal and inconclusive findings on diagnostic imaging of breast: Secondary | ICD-10-CM | POA: Diagnosis not present

## 2018-03-12 DIAGNOSIS — D0511 Intraductal carcinoma in situ of right breast: Secondary | ICD-10-CM | POA: Diagnosis not present

## 2018-03-12 DIAGNOSIS — Z792 Long term (current) use of antibiotics: Secondary | ICD-10-CM | POA: Insufficient documentation

## 2018-03-12 DIAGNOSIS — C50311 Malignant neoplasm of lower-inner quadrant of right female breast: Secondary | ICD-10-CM | POA: Diagnosis not present

## 2018-03-12 DIAGNOSIS — M353 Polymyalgia rheumatica: Secondary | ICD-10-CM | POA: Diagnosis not present

## 2018-03-12 DIAGNOSIS — Z7952 Long term (current) use of systemic steroids: Secondary | ICD-10-CM | POA: Insufficient documentation

## 2018-03-12 HISTORY — PX: BREAST LUMPECTOMY WITH RADIOACTIVE SEED LOCALIZATION: SHX6424

## 2018-03-12 HISTORY — DX: Other specified postprocedural states: Z98.890

## 2018-03-12 HISTORY — DX: Other chronic cystitis without hematuria: N30.20

## 2018-03-12 HISTORY — DX: Malignant (primary) neoplasm, unspecified: C80.1

## 2018-03-12 HISTORY — DX: Adverse effect of unspecified anesthetic, initial encounter: T41.45XA

## 2018-03-12 HISTORY — DX: Other complications of anesthesia, initial encounter: T88.59XA

## 2018-03-12 HISTORY — DX: Nausea with vomiting, unspecified: R11.2

## 2018-03-12 SURGERY — BREAST LUMPECTOMY WITH RADIOACTIVE SEED LOCALIZATION
Anesthesia: General | Site: Breast | Laterality: Right

## 2018-03-12 MED ORDER — ACETAMINOPHEN 500 MG PO TABS: 1000.0000 mg | ORAL_TABLET | Freq: Four times a day (QID) | ORAL | Status: DC

## 2018-03-12 MED ORDER — CEFAZOLIN SODIUM-DEXTROSE 2-4 GM/100ML-% IV SOLN
2.0000 g | INTRAVENOUS | Status: AC
  Administered 2018-03-12: 2 g via INTRAVENOUS

## 2018-03-12 MED ORDER — LIDOCAINE HCL (CARDIAC) PF 100 MG/5ML IV SOSY
PREFILLED_SYRINGE | INTRAVENOUS | Status: DC | PRN
  Administered 2018-03-12: 60 mg via INTRAVENOUS

## 2018-03-12 MED ORDER — FENTANYL CITRATE (PF) 100 MCG/2ML IJ SOLN
INTRAMUSCULAR | Status: DC | PRN
  Administered 2018-03-12: 50 ug via INTRAVENOUS

## 2018-03-12 MED ORDER — SODIUM CHLORIDE 0.9% FLUSH: 3.0000 mL | Freq: Two times a day (BID) | INTRAVENOUS | Status: DC

## 2018-03-12 MED ORDER — SODIUM CHLORIDE 0.9 % IV SOLN: 250.0000 mL | INTRAVENOUS | Status: DC | PRN

## 2018-03-12 MED ORDER — MIDAZOLAM HCL 2 MG/2ML IJ SOLN: 1.0000 mg | INTRAMUSCULAR | Status: DC | PRN

## 2018-03-12 MED ORDER — SCOPOLAMINE 1 MG/3DAYS TD PT72: 1.0000 | MEDICATED_PATCH | Freq: Once | TRANSDERMAL | Status: DC | PRN

## 2018-03-12 MED ORDER — GABAPENTIN 300 MG PO CAPS
ORAL_CAPSULE | ORAL | Status: AC
  Filled 2018-03-12: qty 1

## 2018-03-12 MED ORDER — GABAPENTIN 300 MG PO CAPS
300.0000 mg | ORAL_CAPSULE | ORAL | Status: AC
  Administered 2018-03-12: 300 mg via ORAL

## 2018-03-12 MED ORDER — FENTANYL CITRATE (PF) 100 MCG/2ML IJ SOLN
25.0000 ug | INTRAMUSCULAR | Status: DC | PRN
  Administered 2018-03-12: 25 ug via INTRAVENOUS

## 2018-03-12 MED ORDER — ONDANSETRON HCL 4 MG/2ML IJ SOLN
INTRAMUSCULAR | Status: AC
  Filled 2018-03-12: qty 2

## 2018-03-12 MED ORDER — SODIUM CHLORIDE 0.9% FLUSH: 3.0000 mL | INTRAVENOUS | Status: DC | PRN

## 2018-03-12 MED ORDER — OXYCODONE HCL 5 MG PO TABS: 5.0000 mg | ORAL_TABLET | ORAL | Status: DC | PRN

## 2018-03-12 MED ORDER — LACTATED RINGERS IV SOLN: INTRAVENOUS | Status: DC

## 2018-03-12 MED ORDER — ACETAMINOPHEN 500 MG PO TABS
1000.0000 mg | ORAL_TABLET | ORAL | Status: AC
  Administered 2018-03-12: 1000 mg via ORAL

## 2018-03-12 MED ORDER — BUPIVACAINE-EPINEPHRINE (PF) 0.25% -1:200000 IJ SOLN
INTRAMUSCULAR | Status: DC | PRN
  Administered 2018-03-12: 10 mL

## 2018-03-12 MED ORDER — PROPOFOL 10 MG/ML IV BOLUS
INTRAVENOUS | Status: AC
  Filled 2018-03-12: qty 20

## 2018-03-12 MED ORDER — ACETAMINOPHEN 500 MG PO TABS
ORAL_TABLET | ORAL | Status: AC
  Filled 2018-03-12: qty 2

## 2018-03-12 MED ORDER — FENTANYL CITRATE (PF) 100 MCG/2ML IJ SOLN
INTRAMUSCULAR | Status: AC
  Filled 2018-03-12: qty 2

## 2018-03-12 MED ORDER — DEXAMETHASONE SODIUM PHOSPHATE 10 MG/ML IJ SOLN
INTRAMUSCULAR | Status: AC
  Filled 2018-03-12: qty 1

## 2018-03-12 MED ORDER — FENTANYL CITRATE (PF) 100 MCG/2ML IJ SOLN: 25.0000 ug | INTRAMUSCULAR | Status: DC | PRN

## 2018-03-12 MED ORDER — CHLORHEXIDINE GLUCONATE CLOTH 2 % EX PADS: 6.0000 | MEDICATED_PAD | Freq: Once | CUTANEOUS | Status: DC

## 2018-03-12 MED ORDER — PROPOFOL 10 MG/ML IV BOLUS
INTRAVENOUS | Status: DC | PRN
  Administered 2018-03-12: 20 mg via INTRAVENOUS
  Administered 2018-03-12: 100 mg via INTRAVENOUS

## 2018-03-12 MED ORDER — ONDANSETRON HCL 4 MG/2ML IJ SOLN: 4.0000 mg | Freq: Once | INTRAMUSCULAR | Status: DC | PRN

## 2018-03-12 MED ORDER — LACTATED RINGERS IV SOLN
INTRAVENOUS | Status: DC
  Administered 2018-03-12: 10:00:00 via INTRAVENOUS

## 2018-03-12 MED ORDER — DEXAMETHASONE SODIUM PHOSPHATE 10 MG/ML IJ SOLN
INTRAMUSCULAR | Status: DC | PRN
  Administered 2018-03-12: 10 mg via INTRAVENOUS

## 2018-03-12 MED ORDER — ONDANSETRON HCL 4 MG/2ML IJ SOLN
INTRAMUSCULAR | Status: DC | PRN
  Administered 2018-03-12: 4 mg via INTRAVENOUS

## 2018-03-12 MED ORDER — CEFAZOLIN SODIUM-DEXTROSE 2-4 GM/100ML-% IV SOLN
INTRAVENOUS | Status: AC
  Filled 2018-03-12: qty 100

## 2018-03-12 MED ORDER — HYDROCODONE-ACETAMINOPHEN 5-325 MG PO TABS: 1.0000 | ORAL_TABLET | Freq: Four times a day (QID) | ORAL | 0 refills | Status: DC | PRN

## 2018-03-12 MED ORDER — FENTANYL CITRATE (PF) 100 MCG/2ML IJ SOLN: 50.0000 ug | INTRAMUSCULAR | Status: DC | PRN

## 2018-03-12 MED ORDER — ACETAMINOPHEN 650 MG RE SUPP: 650.0000 mg | RECTAL | Status: DC | PRN

## 2018-03-12 MED ORDER — LIDOCAINE 2% (20 MG/ML) 5 ML SYRINGE
INTRAMUSCULAR | Status: AC
  Filled 2018-03-12: qty 5

## 2018-03-12 MED ORDER — ACETAMINOPHEN 325 MG PO TABS: 650.0000 mg | ORAL_TABLET | ORAL | Status: DC | PRN

## 2018-03-12 SURGICAL SUPPLY — 49 items
ADH SKN CLS APL DERMABOND .7 (GAUZE/BANDAGES/DRESSINGS) ×1
APPLIER CLIP 9.375 MED OPEN (MISCELLANEOUS) ×3
APR CLP MED 9.3 20 MLT OPN (MISCELLANEOUS) ×1
BINDER BREAST LRG (GAUZE/BANDAGES/DRESSINGS) IMPLANT
BINDER BREAST XLRG (GAUZE/BANDAGES/DRESSINGS) ×2 IMPLANT
BLADE HEX COATED 2.75 (ELECTRODE) ×3 IMPLANT
BLADE SURG 15 STRL LF DISP TIS (BLADE) ×1 IMPLANT
BLADE SURG 15 STRL SS (BLADE) ×3
CANISTER SUCT 1200ML W/VALVE (MISCELLANEOUS) ×3 IMPLANT
CHLORAPREP W/TINT 26ML (MISCELLANEOUS) ×3 IMPLANT
CLIP APPLIE 9.375 MED OPEN (MISCELLANEOUS) IMPLANT
COVER BACK TABLE 60X90IN (DRAPES) ×3 IMPLANT
COVER MAYO STAND STRL (DRAPES) ×3 IMPLANT
COVER PROBE W GEL 5X96 (DRAPES) ×3 IMPLANT
DERMABOND ADVANCED (GAUZE/BANDAGES/DRESSINGS) ×2
DERMABOND ADVANCED .7 DNX12 (GAUZE/BANDAGES/DRESSINGS) ×1 IMPLANT
DEVICE DUBIN W/COMP PLATE 8390 (MISCELLANEOUS) ×3 IMPLANT
DRAPE LAPAROSCOPIC ABDOMINAL (DRAPES) ×3 IMPLANT
DRAPE UTILITY XL STRL (DRAPES) ×3 IMPLANT
DRSG PAD ABDOMINAL 8X10 ST (GAUZE/BANDAGES/DRESSINGS) ×3 IMPLANT
ELECT REM PT RETURN 9FT ADLT (ELECTROSURGICAL) ×3
ELECTRODE REM PT RTRN 9FT ADLT (ELECTROSURGICAL) ×1 IMPLANT
GAUZE SPONGE 4X4 12PLY STRL LF (GAUZE/BANDAGES/DRESSINGS) ×3 IMPLANT
GLOVE EUDERMIC 7 POWDERFREE (GLOVE) ×3 IMPLANT
GOWN STRL REUS W/ TWL LRG LVL3 (GOWN DISPOSABLE) ×1 IMPLANT
GOWN STRL REUS W/ TWL XL LVL3 (GOWN DISPOSABLE) ×1 IMPLANT
GOWN STRL REUS W/TWL LRG LVL3 (GOWN DISPOSABLE) ×3
GOWN STRL REUS W/TWL XL LVL3 (GOWN DISPOSABLE) ×3
KIT MARKER MARGIN INK (KITS) ×3 IMPLANT
NDL HYPO 25X1 1.5 SAFETY (NEEDLE) ×1 IMPLANT
NEEDLE HYPO 25X1 1.5 SAFETY (NEEDLE) ×3 IMPLANT
NS IRRIG 1000ML POUR BTL (IV SOLUTION) ×3 IMPLANT
PACK BASIN DAY SURGERY FS (CUSTOM PROCEDURE TRAY) ×3 IMPLANT
PENCIL BUTTON HOLSTER BLD 10FT (ELECTRODE) ×3 IMPLANT
SLEEVE SCD COMPRESS KNEE MED (MISCELLANEOUS) ×3 IMPLANT
SPONGE LAP 4X18 RFD (DISPOSABLE) ×3 IMPLANT
SUT ETHILON 3 0 FSL (SUTURE) IMPLANT
SUT MNCRL AB 4-0 PS2 18 (SUTURE) ×3 IMPLANT
SUT SILK 2 0 SH (SUTURE) ×3 IMPLANT
SUT VIC AB 2-0 CT1 27 (SUTURE)
SUT VIC AB 2-0 CT1 TAPERPNT 27 (SUTURE) IMPLANT
SUT VIC AB 3-0 SH 27 (SUTURE)
SUT VIC AB 3-0 SH 27X BRD (SUTURE) IMPLANT
SUT VICRYL 3-0 CR8 SH (SUTURE) ×3 IMPLANT
SYR 10ML LL (SYRINGE) ×3 IMPLANT
TOWEL GREEN STERILE FF (TOWEL DISPOSABLE) ×3 IMPLANT
TUBE CONNECTING 20'X1/4 (TUBING) ×1
TUBE CONNECTING 20X1/4 (TUBING) ×2 IMPLANT
YANKAUER SUCT BULB TIP NO VENT (SUCTIONS) ×3 IMPLANT

## 2018-03-12 NOTE — Transfer of Care (Signed)
Immediate Anesthesia Transfer of Care Note  Patient: [name]  Procedure(s) Performed: RIGHT BREAST LUMPECTOMY WITH RADIOACTIVE SEED LOCALIZATION (Right Breast)  Patient Location: PACU  Anesthesia Type:General  Level of Consciousness: awake, alert , oriented, drowsy and patient cooperative  Airway & Oxygen Therapy: Patient Spontanous Breathing and Patient connected to face mask oxygen  Post-op Assessment: Report given to RN and Post -op Vital signs reviewed and stable  Post vital signs: Reviewed and stable  Last Vitals:  Vitals Value Taken Time  BP 134/60 03/12/2018 12:24 PM  Temp    Pulse 72 03/12/2018 12:26 PM  Resp 14 03/12/2018 12:26 PM  SpO2 97 % 03/12/2018 12:26 PM  Vitals shown include unvalidated device data.  Last Pain:  Vitals:   03/12/18 0950  TempSrc: Oral  PainSc: 0-No pain         Complications: No apparent anesthesia complications

## 2018-03-12 NOTE — Anesthesia Preprocedure Evaluation (Signed)
Anesthesia Evaluation  Patient identified by MRN, date of birth, ID band Patient awake    Reviewed: Allergy & Precautions, NPO status , Patient's Chart, lab work & pertinent test results  History of Anesthesia Complications (+) PONV and history of anesthetic complications  Airway Mallampati: III  TM Distance: <3 FB Neck ROM: Full    Dental  (+) Teeth Intact, Dental Advisory Given, Caps   Pulmonary neg pulmonary ROS,    Pulmonary exam normal breath sounds clear to auscultation       Cardiovascular negative cardio ROS Normal cardiovascular exam Rhythm:Regular Rate:Normal     Neuro/Psych negative neurological ROS  negative psych ROS   GI/Hepatic Neg liver ROS, neg GERD  ,  Endo/Other  negative endocrine ROS  Renal/GU negative Renal ROS     Musculoskeletal negative musculoskeletal ROS (+)   Abdominal   Peds  Hematology negative hematology ROS (+)   Anesthesia Other Findings Day of surgery medications reviewed with the patient.  Right breast cancer  Reproductive/Obstetrics                             Anesthesia Physical Anesthesia Plan  ASA: III  Anesthesia Plan: General   Post-op Pain Management:    Induction: Intravenous  PONV Risk Score and Plan: 4 or greater and Dexamethasone, Ondansetron and Treatment may vary due to age or medical condition  Airway Management Planned: LMA  Additional Equipment:   Intra-op Plan:   Post-operative Plan: Extubation in OR  Informed Consent: I have reviewed the patients History and Physical, chart, labs and discussed the procedure including the risks, benefits and alternatives for the proposed anesthesia with the patient or authorized representative who has indicated his/her understanding and acceptance.   Dental advisory given  Plan Discussed with: CRNA  Anesthesia Plan Comments:         Anesthesia Quick Evaluation

## 2018-03-12 NOTE — Anesthesia Procedure Notes (Signed)
Procedure Name: LMA Insertion Date/Time: 03/12/2018 11:46 AM Performed by: Raenette Rover, CRNA Pre-anesthesia Checklist: Patient identified, Emergency Drugs available, Suction available and Patient being monitored Patient Re-evaluated:Patient Re-evaluated prior to induction Oxygen Delivery Method: Circle system utilized Preoxygenation: Pre-oxygenation with 100% oxygen Induction Type: IV induction LMA: LMA inserted LMA Size: 4.0 Number of attempts: 2 (once by CRNA--did not seat well; once by MDA--seated well) Placement Confirmation: positive ETCO2,  CO2 detector and breath sounds checked- equal and bilateral Tube secured with: Tape Dental Injury: Teeth and Oropharynx as per pre-operative assessment

## 2018-03-12 NOTE — Discharge Instructions (Signed)
DO NOT GIVE TYLENOL BEFORE 4PM TODAY!    [place] Office Phone Number [phone]  BREAST BIOPSY/ PARTIAL MASTECTOMY: POST OP INSTRUCTIONS  Always review your discharge instruction sheet given to you by the facility where your surgery was performed.  IF YOU HAVE DISABILITY OR FAMILY LEAVE FORMS, YOU MUST BRING THEM TO THE OFFICE FOR PROCESSING.  DO NOT GIVE THEM TO YOUR DOCTOR.  1. A prescription for pain medication may be given to you upon discharge.  Take your pain medication as prescribed, if needed.  If narcotic pain medicine is not needed, then you may take acetaminophen (Tylenol) or ibuprofen (Advil) as needed. No Tylenol until 4:15pm! 2. Take your usually prescribed medications unless otherwise directed 3. If you need a refill on your pain medication, please contact your pharmacy.  They will contact our office to request authorization.  Prescriptions will not be filled after 5pm or on week-ends. 4. You should eat very light the first 24 hours after surgery, such as soup, crackers, pudding, etc.  Resume your normal diet the day after surgery. 5. Most patients will experience some swelling and bruising in the breast.  Ice packs and a good support bra will help.  Swelling and bruising can take several days to resolve.  6. It is common to experience some constipation if taking pain medication after surgery.  Increasing fluid intake and taking a stool softener will usually help or prevent this problem from occurring.  A mild laxative (Milk of Magnesia or Miralax) should be taken according to package directions if there are no bowel movements after 48 hours. 7. Unless discharge instructions indicate otherwise, you may remove your bandages 24-48 hours after surgery, and you may shower at that time.  You may have steri-strips (small skin tapes) in place directly over the incision.  These strips should be left on the skin for 7-10 days.  If your surgeon used skin glue on  the incision, you may shower in 24 hours.  The glue will flake off over the next 2-3 weeks.  Any sutures or staples will be removed at the office during your follow-up visit. 8. ACTIVITIES:  You may resume regular daily activities (gradually increasing) beginning the next day.  Wearing a good support bra or sports bra minimizes pain and swelling.  You may have sexual intercourse when it is comfortable. a. You may drive when you no longer are taking prescription pain medication, you can comfortably wear a seatbelt, and you can safely maneuver your car and apply brakes. b. RETURN TO WORK:  ______________________________________________________________________________________ 9. You should see your doctor in the office for a follow-up appointment approximately two weeks after your surgery.  Your doctors nurse will typically make your follow-up appointment when she calls you with your pathology report.  Expect your pathology report 2-3 business days after your surgery.  You may call to check if you do not hear from [place] after three days. 10. OTHER INSTRUCTIONS: _______________________________________________________________________________________________ _____________________________________________________________________________________________________________________________________ _____________________________________________________________________________________________________________________________________ _____________________________________________________________________________________________________________________________________  WHEN TO CALL YOUR DOCTOR: 1. Fever over 101.0 2. Nausea and/or vomiting. 3. Extreme swelling or bruising. 4. Continued bleeding from incision. 5. Increased pain, redness, or drainage from the incision.  The clinic staff is available to answer your questions during regular business hours.  Please dont hesitate to call and ask to speak to one of the nurses for  clinical concerns.  If you have a medical emergency, go to the nearest emergency room or call 911.  A Psychologist, sport and exercise from [facility] is always on call at the hospital.  For further questions, please visit [email or web address]     Post Anesthesia Home Care Instructions  Activity: Get plenty of rest for the remainder of the day. A responsible individual must stay with you for 24 hours following the procedure.  For the next 24 hours, DO NOT: -Drive a car -Paediatric nurse -Drink alcoholic beverages -Take any medication unless instructed by your physician -Make any legal decisions or sign important papers.  Meals: Start with liquid foods such as gelatin or soup. Progress to regular foods as tolerated. Avoid greasy, spicy, heavy foods. If nausea and/or vomiting occur, drink only clear liquids until the nausea and/or vomiting subsides. Call your physician if vomiting continues.  Special Instructions/Symptoms: Your throat may feel dry or sore from the anesthesia or the breathing tube placed in your throat during surgery. If this causes discomfort, gargle with warm salt water. The discomfort should disappear within 24 hours.  If you had a scopolamine patch placed behind your ear for the management of post- operative nausea and/or vomiting:  1. The medication in the patch is effective for 72 hours, after which it should be removed.  Wrap patch in a tissue and discard in the trash. Wash hands thoroughly with soap and water. 2. You may remove the patch earlier than 72 hours if you experience unpleasant side effects which may include dry mouth, dizziness or visual disturbances. 3. Avoid touching the patch. Wash your hands with soap and water after contact with the patch.

## 2018-03-12 NOTE — Anesthesia Postprocedure Evaluation (Signed)
Anesthesia Post Note  Patient: [name]  Procedure(s) Performed: RIGHT BREAST LUMPECTOMY WITH RADIOACTIVE SEED LOCALIZATION (Right Breast)     Patient location during evaluation: PACU Anesthesia Type: General Level of consciousness: awake and alert, awake and oriented Pain management: pain level controlled Vital Signs Assessment: post-procedure vital signs reviewed and stable Respiratory status: spontaneous breathing, nonlabored ventilation and respiratory function stable Cardiovascular status: blood pressure returned to baseline and stable Postop Assessment: no apparent nausea or vomiting Anesthetic complications: no    Last Vitals:  Vitals:   03/12/18 1315 03/12/18 1335  BP: 129/63 (!) 138/56  Pulse: 63 66  Resp: 12 12  Temp:  36.6 C  SpO2: 95% 98%    Last Pain:  Vitals:   03/12/18 1335  TempSrc:   PainSc: 0-No pain                 Catalina Gravel

## 2018-03-12 NOTE — Op Note (Signed)
Patient Name:           [name]   Date of Surgery:        03/12/2018  Pre op Diagnosis:      Cancer right breast  Post op Diagnosis:    Same  Procedure:                 Right breast lumpectomy with radioactive seed localization and margin assessment  Surgeon:                     Edsel Petrin. Dalbert Batman, M.D., FACS  Assistant:                      OR staff  Operative Indications:    This is an 83 year old female from [place] who is brought to the operating room for definitive surgical management of right breast cancer, lower inner quadrant.  Dr. Wende Neighbors is her PCP.      She has no prior breast problems. Recent screening mammogram show an area of distortion in the right breast, 3:30 position, lower inner quadrant, 2 cm from the nipple, 5 mm diameter. 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 The pathologist thought this might be in situ cancer in a papillary lesion or intracystic papillary ductal carcinoma. Hormone receptors are strongly positive.  She has been referred to medical oncology at the [facility].   Family history is negative for breast cancer or ovarian cancer.      We had a very long conversation close to one hour for discussion of management of her breast cancer. I told her this was probably a low-grade tumor with low metastatic potential. From a surgical standpoint we discussed lumpectomy, radiation therapy, anti-estrogen therapy. We compare that to mastectomy with or without reconstruction. I told her I did not believe there is any survival advantage to mastectomy. Given her age and clinically negative axilla and the low-grade nature the tumor I do not think she would benefit from sentinel lymph node biopsy. She prefers breast conservation and I think she is a good candidate for that. She agrees with this plan.   Operative Findings:       The biopsy clips and the radioactive  seed were in the right breast medially.  The broad anterior margin is the skin.  Some of the posterior margin is the fascia.  The breast was relatively thin in this area.  The specimen mammogram looked good containing the seed and the marker clips.  Procedure in Detail:          Following the induction of general LMA anesthesia the patient's right breast was prepped and draped in a sterile fashion.  Intravenous antibiotics were given.  Surgical timeout was performed.  0.5% Marcaine with epinephrine was used as a local infiltration anesthetic.  Using the neoprobe as a guide a transverse radially oriented incision was made at the 3 o'clock position of the right breast.  Lumpectomy was performed using neoprobe and cautery.  The specimen was removed and marked with silk sutures and a 6 color ink kit to orient the pathologist.  The specimen mammogram looked good.  The specimen was sent to the lab where the seed was retrieved.  Hemostasis was excellent.  The wound was irrigated with saline.  5 metal marker clips were placed in the walls of the lumpectomy cavity.  The lumpectomy cavity was closed with  interrupted 3-0 Vicryl and the skin closed with running subcuticular 4-0 Monocryl and Dermabond.  Clean bandages and a breast binder were placed.  The patient tolerated the procedure well was taken to PACU in stable condition.  EBL 10 cc.  Counts correct.  Complications none.   Addendum: I logged onto the [employer] and reviewed her prescription medication history.     Edsel Petrin. Dalbert Batman, M.D., FACS General and Minimally Invasive Surgery Breast and Colorectal Surgery  03/12/2018 12:21 PM

## 2018-03-12 NOTE — Interval H&P Note (Signed)
History and Physical Interval Note:  03/12/2018 10:25 AM  [name]  has presented today for surgery, with the diagnosis of RIGHT BREAST CANCER  The various methods of treatment have been discussed with the patient and family. After consideration of risks, benefits and other options for treatment, the patient has consented to  Procedure(s): RIGHT BREAST LUMPECTOMY WITH RADIOACTIVE SEED LOCALIZATION (Right) as a surgical intervention .  The patient's history has been reviewed, patient examined, no change in status, stable for surgery.  I have reviewed the patient's chart and labs.  Questions were answered to the patient's satisfaction.     Adin Hector

## 2018-03-13 ENCOUNTER — Encounter (HOSPITAL_BASED_OUTPATIENT_CLINIC_OR_DEPARTMENT_OTHER): Payer: Self-pay | Admitting: General Surgery

## 2018-03-14 NOTE — Progress Notes (Signed)
Inform patient of Pathology report,. Breast pathology shows the small cancer as expected. The margins are clear and she will not need any further surgery This is good news I will discuss this with her in detail at her next office visit Let me know that you reached her   Dalbert Batman

## 2018-03-24 ENCOUNTER — Ambulatory Visit (HOSPITAL_COMMUNITY)
Admission: RE | Admit: 2018-03-24 | Discharge: 2018-03-24 | Disposition: A | Discharge status: Home / Self Care | Payer: PPO | Source: Ambulatory Visit | Attending: Internal Medicine | Admitting: Internal Medicine

## 2018-03-24 ENCOUNTER — Other Ambulatory Visit (HOSPITAL_COMMUNITY): Payer: Self-pay | Admitting: Internal Medicine

## 2018-03-24 DIAGNOSIS — M1990 Unspecified osteoarthritis, unspecified site: Secondary | ICD-10-CM | POA: Insufficient documentation

## 2018-03-24 DIAGNOSIS — R030 Elevated blood-pressure reading, without diagnosis of hypertension: Secondary | ICD-10-CM | POA: Diagnosis not present

## 2018-03-24 DIAGNOSIS — Z23 Encounter for immunization: Secondary | ICD-10-CM | POA: Diagnosis not present

## 2018-03-24 DIAGNOSIS — M25551 Pain in right hip: Secondary | ICD-10-CM | POA: Insufficient documentation

## 2018-03-24 DIAGNOSIS — Z6826 Body mass index (BMI) 26.0-26.9, adult: Secondary | ICD-10-CM | POA: Diagnosis not present

## 2018-03-24 DIAGNOSIS — E663 Overweight: Secondary | ICD-10-CM | POA: Diagnosis not present

## 2018-03-26 ENCOUNTER — Other Ambulatory Visit (HOSPITAL_COMMUNITY): Payer: Self-pay | Admitting: Internal Medicine

## 2018-03-26 DIAGNOSIS — Z780 Asymptomatic menopausal state: Secondary | ICD-10-CM

## 2018-03-31 DIAGNOSIS — R030 Elevated blood-pressure reading, without diagnosis of hypertension: Secondary | ICD-10-CM | POA: Diagnosis not present

## 2018-03-31 DIAGNOSIS — M25551 Pain in right hip: Secondary | ICD-10-CM | POA: Diagnosis not present

## 2018-03-31 DIAGNOSIS — Z7952 Long term (current) use of systemic steroids: Secondary | ICD-10-CM | POA: Diagnosis not present

## 2018-04-02 ENCOUNTER — Other Ambulatory Visit (HOSPITAL_COMMUNITY): Payer: Self-pay

## 2018-04-02 DIAGNOSIS — D0511 Intraductal carcinoma in situ of right breast: Secondary | ICD-10-CM

## 2018-04-07 ENCOUNTER — Inpatient Hospital Stay (HOSPITAL_COMMUNITY): Payer: PPO | Attending: Hematology

## 2018-04-07 DIAGNOSIS — M353 Polymyalgia rheumatica: Secondary | ICD-10-CM | POA: Diagnosis not present

## 2018-04-07 DIAGNOSIS — Z170 Estrogen receptor positive status [ER+]: Secondary | ICD-10-CM | POA: Insufficient documentation

## 2018-04-07 DIAGNOSIS — E7800 Pure hypercholesterolemia, unspecified: Secondary | ICD-10-CM | POA: Insufficient documentation

## 2018-04-07 DIAGNOSIS — K219 Gastro-esophageal reflux disease without esophagitis: Secondary | ICD-10-CM | POA: Insufficient documentation

## 2018-04-07 DIAGNOSIS — Z7982 Long term (current) use of aspirin: Secondary | ICD-10-CM | POA: Insufficient documentation

## 2018-04-07 DIAGNOSIS — D0511 Intraductal carcinoma in situ of right breast: Secondary | ICD-10-CM | POA: Insufficient documentation

## 2018-04-07 DIAGNOSIS — Z79899 Other long term (current) drug therapy: Secondary | ICD-10-CM | POA: Insufficient documentation

## 2018-04-07 LAB — CBC WITH DIFFERENTIAL/PLATELET
Abs Immature Granulocytes: 0.02 10*3/uL (ref 0.00–0.07)
Basophils Absolute: 0 10*3/uL (ref 0.0–0.1)
Basophils Relative: 1 %
Eosinophils Absolute: 0.2 10*3/uL (ref 0.0–0.5)
Eosinophils Relative: 4 %
HCT: 37.3 % (ref 36.0–46.0)
Hemoglobin: 12 g/dL (ref 12.0–15.0)
Immature Granulocytes: 1 %
Lymphocytes Relative: 33 %
Lymphs Abs: 1.4 10*3/uL (ref 0.7–4.0)
MCH: 30.3 pg (ref 26.0–34.0)
MCHC: 32.2 g/dL (ref 30.0–36.0)
MCV: 94.2 fL (ref 80.0–100.0)
Monocytes Absolute: 0.4 10*3/uL (ref 0.1–1.0)
Monocytes Relative: 8 %
Neutro Abs: 2.3 10*3/uL (ref 1.7–7.7)
Neutrophils Relative %: 53 %
Platelets: 226 10*3/uL (ref 150–400)
RBC: 3.96 MIL/uL (ref 3.87–5.11)
RDW: 13.3 % (ref 11.5–15.5)
WBC: 4.3 10*3/uL (ref 4.0–10.5)
nRBC: 0 % (ref 0.0–0.2)

## 2018-04-07 LAB — COMPREHENSIVE METABOLIC PANEL
ALT: 17 U/L (ref 0–44)
AST: 23 U/L (ref 15–41)
Albumin: 4.1 g/dL (ref 3.5–5.0)
Alkaline Phosphatase: 64 U/L (ref 38–126)
Anion gap: 7 (ref 5–15)
BUN: 17 mg/dL (ref 8–23)
CO2: 24 mmol/L (ref 22–32)
Calcium: 8.7 mg/dL — ABNORMAL LOW (ref 8.9–10.3)
Chloride: 104 mmol/L (ref 98–111)
Creatinine, Ser: 0.73 mg/dL (ref 0.44–1.00)
GFR calc Af Amer: >60 mL/min (ref >60)
GFR calc non Af Amer: >60 mL/min (ref >60)
Glucose, Bld: 102 mg/dL — ABNORMAL HIGH (ref 70–99)
Potassium: 4.1 mmol/L (ref 3.5–5.1)
Sodium: 135 mmol/L (ref 135–145)
Total Bilirubin: 0.9 mg/dL (ref 0.3–1.2)
Total Protein: 6.5 g/dL (ref 6.5–8.1)

## 2018-04-09 ENCOUNTER — Other Ambulatory Visit: Payer: Self-pay

## 2018-04-09 ENCOUNTER — Inpatient Hospital Stay (HOSPITAL_BASED_OUTPATIENT_CLINIC_OR_DEPARTMENT_OTHER): Payer: PPO | Admitting: Hematology

## 2018-04-09 ENCOUNTER — Encounter (HOSPITAL_COMMUNITY): Payer: Self-pay | Admitting: Hematology

## 2018-04-09 VITALS — BP 142/61 | HR 72 | Temp 97.5°F | Resp 18 | Wt 177.0 lb

## 2018-04-09 DIAGNOSIS — K219 Gastro-esophageal reflux disease without esophagitis: Secondary | ICD-10-CM

## 2018-04-09 DIAGNOSIS — E7800 Pure hypercholesterolemia, unspecified: Secondary | ICD-10-CM | POA: Diagnosis not present

## 2018-04-09 DIAGNOSIS — Z170 Estrogen receptor positive status [ER+]: Secondary | ICD-10-CM

## 2018-04-09 DIAGNOSIS — Z79899 Other long term (current) drug therapy: Secondary | ICD-10-CM

## 2018-04-09 DIAGNOSIS — M353 Polymyalgia rheumatica: Secondary | ICD-10-CM

## 2018-04-09 DIAGNOSIS — Z7982 Long term (current) use of aspirin: Secondary | ICD-10-CM | POA: Diagnosis not present

## 2018-04-09 DIAGNOSIS — D0511 Intraductal carcinoma in situ of right breast: Secondary | ICD-10-CM

## 2018-04-09 NOTE — Patient Instructions (Signed)
[facility] at [facility] Discharge Instructions     Thank you for choosing [facility] at [facility] to provide your oncology and hematology care.  To afford each patient quality time with our provider, please arrive at least 15 minutes before your scheduled appointment time.   If you have a lab appointment with the Cancer Center please come in thru the  Main Entrance and check in at the main information desk  You need to re-schedule your appointment should you arrive 10 or more minutes late.  We strive to give you quality time with our providers, and arriving late affects you and other patients whose appointments are after yours.  Also, if you no show three or more times for appointments you may be dismissed from the clinic at the providers discretion.     Again, thank you for choosing [facility].  Our hope is that these requests will decrease the amount of time that you wait before being seen by our physicians.       _____________________________________________________________  Should you have questions after your visit to  Cancer Center, please contact our office at [phone] between the hours of 8:00 a.m. and 4:30 p.m.  Voicemails left after 4:00 p.m. will not be returned until the following business day.  For prescription refill requests, have your pharmacy contact our office and allow 72 hours.    Cancer Center Support Programs:   > Cancer Support Group  2nd Tuesday of the month 1pm-2pm, Journey Room    

## 2018-04-09 NOTE — Progress Notes (Signed)
[place] [place], [place] [address]   CLINIC:  Medical Oncology/Hematology  PCP:  Celene Squibb, MD [place] [address] [phone]   REASON FOR VISIT: Follow-up for right breast DCIS, ER+/PR+  CURRENT THERAPY: lumpectomy of the right breast done by Dr. Dalbert Batman on 03/12/2018.   INTERVAL HISTORY:  [name] 83 y.o. female returns for routine follow-up right breast DCIS. She is here today with her granddaughter. She is doing well since her lumpectomy. She had her post op appointment last week. She still has a few steri strips left but it is healing well with no drainage or redness. It is not hurting her at all. She denies any new pains or lumps present. Denies any fevers or recent infections. She reports her appetite at 100% and her energy at 25%. She has no problem maintatining her weight. She tries to remain active at home and performs all her own ADLs and activities.   She has a bone density test on 04/21/18.    REVIEW OF SYSTEMS:  Review of Systems  Constitutional: Positive for fatigue.  Neurological: Positive for numbness.  All other systems reviewed and are negative.    PAST MEDICAL/SURGICAL HISTORY:  Past Medical History:  Diagnosis Date  . Bladder infection, chronic   . Cancer ([place]) 02/2018   right breast cancer  . Complication of anesthesia   . GERD (gastroesophageal reflux disease)   . Hemorrhoids   . Hypercholesterolemia   . Polymyalgia rheumatica ([place])   . PONV (postoperative nausea and vomiting)   . S/P colonoscopy Jun 14, 2004   friable internal and external hemorrhoids, left-sided diverticula  . S/P endoscopy August 13, 2006   pale 1-3 mm nodules consistent with benign squamous papilloma, tiny hiatal hernia  . Serrated adenoma of colon    Past Surgical History:  Procedure Laterality Date  . ABDOMINAL HYSTERECTOMY    . BREAST LUMPECTOMY WITH RADIOACTIVE SEED LOCALIZATION Right 03/12/2018   Procedure: RIGHT  BREAST LUMPECTOMY WITH RADIOACTIVE SEED LOCALIZATION;  Surgeon: Fanny Skates, MD;  Location: [facility];  Service: General;  Laterality: Right;  . CHOLECYSTECTOMY    . COLONOSCOPY  11/20/2010   Dr. Gala Romney- serrated adenomaL side diverticulosis, hemorrhoids  . COLONOSCOPY  06/14/04   friable internal and external hemorrhoids, left-sided diverticula  . COLONOSCOPY N/A 08/23/2016   Dr. Gala Romney: Diverticulosis, medium-sized grade 2 internal hemorrhoids.  . ESOPHAGOGASTRODUODENOSCOPY  08/13/06   pale 1-3 mm nodules consistent with benign squamous papilloma, tiny hiatal hernia  . ESOPHAGOGASTRODUODENOSCOPY N/A 08/23/2016   Dr. Gala Romney: Normal  . FLEXIBLE SIGMOIDOSCOPY  11/15/2011   Procedure: FLEXIBLE SIGMOIDOSCOPY;  Surgeon: Daneil Dolin, MD;  [facility];  Service: Endoscopy;  Laterality: N/A;  12:30PM  . WRIST FRACTURE SURGERY Right      SOCIAL HISTORY:  Social History   Socioeconomic History  . Marital status: Widowed    Spouse name: Not on file  . Number of children: 2  . Years of education: Not on file  . Highest education level: Not on file  Occupational History  . Not on file  Social Needs  . Financial resource strain: Not hard at all  . Food insecurity:    Worry: Never true    Inability: Never true  . Transportation needs:    Medical: No    Non-medical: No  Tobacco Use  . Smoking status: Never Smoker  . Smokeless tobacco: Never Used  Substance and Sexual  Activity  . Alcohol use: Yes    Alcohol/week: 2.0 standard drinks    Types: 2 Glasses of wine per week    Comment: Drinks wine 2-3 days per week ( usually a couple of glasses each time)  . Drug use: No  . Sexual activity: Not on file  Lifestyle  . Physical activity:    Days per week: 0 days    Minutes per session: 0 min  . Stress: To some extent  Relationships  . Social connections:    Talks on phone: More than three times a week    Gets together: Three times a week    Attends religious  service: More than 4 times per year    Active member of club or organization: No    Attends meetings of clubs or organizations: Never    Relationship status: Widowed  . Intimate partner violence:    Fear of current or ex partner: No    Emotionally abused: No    Physically abused: No    Forced sexual activity: No  Other Topics Concern  . Not on file  Social History Narrative  . Not on file    FAMILY HISTORY:  Family History  Problem Relation Age of Onset  . Thyroid cancer Mother   . Parkinson's disease Sister   . Thyroid disease Brother   . Aneurysm Sister   . Thyroid cancer Other   . Thyroid cancer Other   . Colon cancer Neg Hx     CURRENT MEDICATIONS:  Outpatient Encounter Medications as of 04/09/2018  Medication Sig  . aspirin EC 81 MG tablet Take 81 mg by mouth daily.   . Calcium Carb-Cholecalciferol (CALCIUM 600 + D PO) Take 1 tablet by mouth daily.   . cephALEXin (KEFLEX) 250 MG capsule Take 250 mg by mouth daily.   . Multiple Vitamin (MULTIVITAMIN WITH MINERALS) TABS tablet Take 1 tablet by mouth daily.  . naproxen sodium (ALEVE) 220 MG tablet Take 220 mg by mouth daily as needed (pain).  . nitrofurantoin, macrocrystal-monohydrate, (MACROBID) 100 MG capsule   . polyethylene glycol (MIRALAX / GLYCOLAX) packet Take 17 g by mouth daily as needed.  . Wheat Dextrin (BENEFIBER) POWD Take by mouth daily. Takes 1 tbsp twice a day   . [DISCONTINUED] HYDROcodone-acetaminophen (NORCO) 5-325 MG tablet Take 1 tablet by mouth every 6 (six) hours as needed for moderate pain or severe pain.  . [DISCONTINUED] Magnesium 200 MG TABS Take 250 mg by mouth daily.   . [DISCONTINUED] predniSONE (DELTASONE) 5 MG tablet Take 5 mg by mouth daily with breakfast.   No facility-administered encounter medications on file as of 04/09/2018.     ALLERGIES:  Allergies  Allergen Reactions  . Fish Allergy Nausea And Vomiting  . Shrimp [Shellfish Allergy] Nausea And Vomiting     PHYSICAL EXAM:    ECOG Performance status: 1  Vitals:   04/09/18 1507  BP: (!) 142/61  Pulse: 72  Resp: 18  Temp: (!) 97.5 F (36.4 C)  SpO2: 98%   Filed Weights   04/09/18 1507  Weight: 177 lb (80.3 kg)    Physical Exam  Constitutional: She is oriented to person, place, and time. She appears well-developed and well-nourished.  Musculoskeletal: Normal range of motion.  Neurological: She is alert and oriented to person, place, and time.  Skin: Skin is warm and dry.  Psychiatric: She has a normal mood and affect. Her behavior is normal. Judgment and thought content normal.     LABORATORY  DATA:  I have reviewed the labs as listed.  CBC    Component Value Date/Time   WBC 4.3 04/07/2018 1456   RBC 3.96 04/07/2018 1456   HGB 12.0 04/07/2018 1456   HGB 13.8 09/13/2013 1842   HCT 37.3 04/07/2018 1456   HCT 41.3 09/13/2013 1842   PLT 226 04/07/2018 1456   PLT 231 09/13/2013 1842   MCV 94.2 04/07/2018 1456   MCV 93 09/13/2013 1842   MCH 30.3 04/07/2018 1456   MCHC 32.2 04/07/2018 1456   RDW 13.3 04/07/2018 1456   RDW 14.1 09/13/2013 1842   LYMPHSABS 1.4 04/07/2018 1456   LYMPHSABS 0.8 (L) 09/13/2013 1842   MONOABS 0.4 04/07/2018 1456   MONOABS 0.4 09/13/2013 1842   EOSABS 0.2 04/07/2018 1456   EOSABS 0.0 09/13/2013 1842   BASOSABS 0.0 04/07/2018 1456   BASOSABS 0.0 09/13/2013 1842   CMP Latest Ref Rng & Units 04/07/2018 03/05/2018 04/04/2017  Glucose 70 - 99 mg/dL 102(H) 94 -  BUN 8 - 23 mg/dL 17 11 -  Creatinine 0.44 - 1.00 mg/dL 0.73 0.72 0.70  Sodium 135 - 145 mmol/L 135 139 -  Potassium 3.5 - 5.1 mmol/L 4.1 4.5 -  Chloride 98 - 111 mmol/L 104 107 -  CO2 22 - 32 mmol/L 24 27 -  Calcium 8.9 - 10.3 mg/dL 8.7(L) 9.1 -  Total Protein 6.5 - 8.1 g/dL 6.5 6.4(L) -  Total Bilirubin 0.3 - 1.2 mg/dL 0.9 0.9 -  Alkaline Phos 38 - 126 U/L 64 65 -  AST 15 - 41 U/L 23 22 -  ALT 0 - 44 U/L 17 19 -       DIAGNOSTIC IMAGING:  I have independently reviewed the scans and discussed with  the patient. .   I have reviewed Francene Finders, NP's note and agree with the documentation.  I personally performed a face-to-face visit, made revisions and my assessment and plan is as follows.    ASSESSMENT & PLAN:   Breast neoplasm, Tis (DCIS), right 1.  Right breast DCIS: - Screening mammogram on 02/03/2018 was abnormal. -Ultrasound of the right breast on 02/11/2018 shows mass measuring 0.5 x 0.4 x 0.5 cm, circumscribed hypoechoic in the 3:30 o'clock location of the right breast.  Right axilla ultrasound was negative for adenopathy. - Right breast core biopsy on 02/18/2018 shows ductal carcinoma with papillary configuration.  In the comment section, it notes that this may represent DCIS involving a papillary lesion or it may represent an intracystic papillary ductal carcinoma.  ER/PR was 95% positive. - Right breast lumpectomy on 03/12/2018, pathology showing intermediate grade DCIS, 0.6 cm, with areas showing papillary configuration.  Posterior margin was 0.2 cm.  No evidence of invasion. - We discussed postsurgical treatment in the form of APBI and antiestrogen therapy.  Patient was reluctant to consider both.  We talked about starting her on aromatase inhibitor for 5 years.  Patient has osteoporosis and has been on Forteo. -Hence I have recommended 5 years of tamoxifen.  We talked about side effects including but not limited to hot flashes.  She already has hysterectomy done.  We have sent a prescription to her pharmacy. - We will see her back every 6 months for the next 5 years.  She will continue mammograms every 12 months.      Orders placed this encounter:  Orders Placed This Encounter  Procedures  . CBC with Differential/Platelet  . Comprehensive metabolic panel      Derek Jack, MD [facility]ene[facility] Lever  [place] [phone]

## 2018-04-09 NOTE — Assessment & Plan Note (Signed)
1.  Right breast DCIS: - Screening mammogram on 02/03/2018 was abnormal. -Ultrasound of the right breast on 02/11/2018 shows mass measuring 0.5 x 0.4 x 0.5 cm, circumscribed hypoechoic in the 3:30 o'clock location of the right breast.  Right axilla ultrasound was negative for adenopathy. - Right breast core biopsy on 02/18/2018 shows ductal carcinoma with papillary configuration.  In the comment section, it notes that this may represent DCIS involving a papillary lesion or it may represent an intracystic papillary ductal carcinoma.  ER/PR was 95% positive. - Right breast lumpectomy on 03/12/2018, pathology showing intermediate grade DCIS, 0.6 cm, with areas showing papillary configuration.  Posterior margin was 0.2 cm.  No evidence of invasion. - We discussed postsurgical treatment in the form of APBI and antiestrogen therapy.  Patient was reluctant to consider both.  We talked about starting her on aromatase inhibitor for 5 years.  Patient has osteoporosis and has been on Forteo. -Hence I have recommended 5 years of tamoxifen.  We talked about side effects including but not limited to hot flashes.  She already has hysterectomy done.  We have sent a prescription to her pharmacy. - We will see her back every 6 months for the next 5 years.  She will continue mammograms every 12 months.

## 2018-04-10 ENCOUNTER — Telehealth (HOSPITAL_COMMUNITY): Payer: Self-pay | Admitting: *Deleted

## 2018-04-18 ENCOUNTER — Other Ambulatory Visit (HOSPITAL_COMMUNITY): Payer: Self-pay | Admitting: Hematology

## 2018-04-18 DIAGNOSIS — D0511 Intraductal carcinoma in situ of right breast: Secondary | ICD-10-CM

## 2018-04-18 MED ORDER — TAMOXIFEN CITRATE 20 MG PO TABS: 20.0000 mg | ORAL_TABLET | Freq: Every day | ORAL | 6 refills | Status: DC

## 2018-04-21 ENCOUNTER — Ambulatory Visit (HOSPITAL_COMMUNITY)
Admission: RE | Admit: 2018-04-21 | Discharge: 2018-04-21 | Disposition: A | Discharge status: Home / Self Care | Payer: PPO | Source: Ambulatory Visit | Attending: Internal Medicine | Admitting: Internal Medicine

## 2018-04-21 DIAGNOSIS — Z780 Asymptomatic menopausal state: Secondary | ICD-10-CM | POA: Insufficient documentation

## 2018-04-21 DIAGNOSIS — M8589 Other specified disorders of bone density and structure, multiple sites: Secondary | ICD-10-CM | POA: Diagnosis not present

## 2018-05-06 DIAGNOSIS — M353 Polymyalgia rheumatica: Secondary | ICD-10-CM | POA: Diagnosis not present

## 2018-05-06 DIAGNOSIS — R300 Dysuria: Secondary | ICD-10-CM | POA: Diagnosis not present

## 2018-05-06 DIAGNOSIS — N390 Urinary tract infection, site not specified: Secondary | ICD-10-CM | POA: Diagnosis not present

## 2018-05-06 DIAGNOSIS — M810 Age-related osteoporosis without current pathological fracture: Secondary | ICD-10-CM | POA: Diagnosis not present

## 2018-05-06 DIAGNOSIS — M19049 Primary osteoarthritis, unspecified hand: Secondary | ICD-10-CM | POA: Diagnosis not present

## 2018-05-06 DIAGNOSIS — Z6826 Body mass index (BMI) 26.0-26.9, adult: Secondary | ICD-10-CM | POA: Diagnosis not present

## 2018-05-06 DIAGNOSIS — Z0000 Encounter for general adult medical examination without abnormal findings: Secondary | ICD-10-CM | POA: Diagnosis not present

## 2018-05-06 DIAGNOSIS — E663 Overweight: Secondary | ICD-10-CM | POA: Diagnosis not present

## 2018-05-06 DIAGNOSIS — Z7952 Long term (current) use of systemic steroids: Secondary | ICD-10-CM | POA: Diagnosis not present

## 2018-05-06 DIAGNOSIS — M791 Myalgia, unspecified site: Secondary | ICD-10-CM | POA: Diagnosis not present

## 2018-05-06 DIAGNOSIS — Z23 Encounter for immunization: Secondary | ICD-10-CM | POA: Diagnosis not present

## 2018-05-06 DIAGNOSIS — E782 Mixed hyperlipidemia: Secondary | ICD-10-CM | POA: Diagnosis not present

## 2018-05-15 ENCOUNTER — Telehealth (HOSPITAL_COMMUNITY): Payer: Self-pay | Admitting: Emergency Medicine

## 2018-05-15 NOTE — Telephone Encounter (Signed)
Dr. Durene Cal office called stating the patient has complained about her tamoxifen causing her to have body aches and pains. They would like to know if patient can stop taking this medication for one week to see if it helps. Message routed to [facility], NP regarding this.

## 2018-05-20 NOTE — Discharge Instructions (Signed)
Denosumab injection °What is this medicine? °DENOSUMAB (den oh sue mab) slows bone breakdown. Prolia is used to treat osteoporosis in women after menopause and in men, and in people who are taking corticosteroids for 6 months or more. Xgeva is used to treat a high calcium level due to cancer and to prevent bone fractures and other bone problems caused by multiple myeloma or cancer bone metastases. Xgeva is also used to treat giant cell tumor of the bone. °This medicine may be used for other purposes; ask your health care provider or pharmacist if you have questions. °COMMON BRAND NAME(S): Prolia, XGEVA °What should I tell my health care provider before I take this medicine? °They need to know if you have any of these conditions: °-dental disease °-having surgery or tooth extraction °-infection °-kidney disease °-low levels of calcium or Vitamin D in the blood °-malnutrition °-on hemodialysis °-skin conditions or sensitivity °-thyroid or parathyroid disease °-an unusual reaction to denosumab, other medicines, foods, dyes, or preservatives °-pregnant or trying to get pregnant °-breast-feeding °How should I use this medicine? °This medicine is for injection under the skin. It is given by a health care professional in a hospital or clinic setting. °A special MedGuide will be given to you before each treatment. Be sure to read this information carefully each time. °For Prolia, talk to your pediatrician regarding the use of this medicine in children. Special care may be needed. For Xgeva, talk to your pediatrician regarding the use of this medicine in children. While this drug may be prescribed for children as young as 13 years for selected conditions, precautions do apply. °Overdosage: If you think you have taken too much of this medicine contact a poison control center or emergency room at once. °NOTE: This medicine is only for you. Do not share this medicine with others. °What if I miss a dose? °It is important not to  miss your dose. Call your doctor or health care professional if you are unable to keep an appointment. °What may interact with this medicine? °Do not take this medicine with any of the following medications: °-other medicines containing denosumab °This medicine may also interact with the following medications: °-medicines that lower your chance of fighting infection °-steroid medicines like prednisone or cortisone °This list may not describe all possible interactions. Give your health care provider a list of all the medicines, herbs, non-prescription drugs, or dietary supplements you use. Also tell them if you smoke, drink alcohol, or use illegal drugs. Some items may interact with your medicine. °What should I watch for while using this medicine? °Visit your doctor or health care professional for regular checks on your progress. Your doctor or health care professional may order blood tests and other tests to see how you are doing. °Call your doctor or health care professional for advice if you get a fever, chills or sore throat, or other symptoms of a cold or flu. Do not treat yourself. This drug may decrease your body's ability to fight infection. Try to avoid being around people who are sick. °You should make sure you get enough calcium and vitamin D while you are taking this medicine, unless your doctor tells you not to. Discuss the foods you eat and the vitamins you take with your health care professional. °See your dentist regularly. Brush and floss your teeth as directed. Before you have any dental work done, tell your dentist you are receiving this medicine. °Do not become pregnant while taking this medicine or for 5 months   after stopping it. Talk with your doctor or health care professional about your birth control options while taking this medicine. Women should inform their doctor if they wish to become pregnant or think they might be pregnant. There is a potential for serious side effects to an unborn  child. Talk to your health care professional or pharmacist for more information. °What side effects may I notice from receiving this medicine? °Side effects that you should report to your doctor or health care professional as soon as possible: °-allergic reactions like skin rash, itching or hives, swelling of the face, lips, or tongue °-bone pain °-breathing problems °-dizziness °-jaw pain, especially after dental work °-redness, blistering, peeling of the skin °-signs and symptoms of infection like fever or chills; cough; sore throat; pain or trouble passing urine °-signs of low calcium like fast heartbeat, muscle cramps or muscle pain; pain, tingling, numbness in the hands or feet; seizures °-unusual bleeding or bruising °-unusually weak or tired °Side effects that usually do not require medical attention (report to your doctor or health care professional if they continue or are bothersome): °-constipation °-diarrhea °-headache °-joint pain °-loss of appetite °-muscle pain °-runny nose °-tiredness °-upset stomach °This list may not describe all possible side effects. Call your doctor for medical advice about side effects. You may report side effects to FDA at 1-800-FDA-1088. °Where should I keep my medicine? °This medicine is only given in a clinic, doctor's office, or other health care setting and will not be stored at home. °NOTE: This sheet is a summary. It may not cover all possible information. If you have questions about this medicine, talk to your doctor, pharmacist, or health care provider. °© 2019 [employer] (2017-08-30 16:10:44) ° °

## 2018-05-21 ENCOUNTER — Telehealth (HOSPITAL_COMMUNITY): Payer: Self-pay | Admitting: Emergency Medicine

## 2018-05-21 DIAGNOSIS — N390 Urinary tract infection, site not specified: Secondary | ICD-10-CM | POA: Diagnosis not present

## 2018-05-21 DIAGNOSIS — M19049 Primary osteoarthritis, unspecified hand: Secondary | ICD-10-CM | POA: Diagnosis not present

## 2018-05-21 DIAGNOSIS — R030 Elevated blood-pressure reading, without diagnosis of hypertension: Secondary | ICD-10-CM | POA: Diagnosis not present

## 2018-05-21 DIAGNOSIS — E782 Mixed hyperlipidemia: Secondary | ICD-10-CM | POA: Diagnosis not present

## 2018-05-21 NOTE — Telephone Encounter (Addendum)
VM left for patient to call back regarding this.    ----- Message from Glennie Isle, NP-C sent at 05/21/2018  9:50 AM EST ----- Regarding: RE: Tamoxifen Yes this is fine she can stop for one week to see. Tamoxifen doesn't usually cause body aches tho.  ----- Message ----- From: Hughie Closs, LPN Sent: 09/10/1535   2:05 PM EST To: Farley Ly, LPN, Glennie Isle, NP-C Subject: Tamoxifen                                      Dr.Hall's office called saying that the patient has complained to them about the tamoxifen causing her to have body aches and pains. Their pharmacist wanted to know if it would be ok for the patient to stop taking it for one week to see if makes the pain stop.   Tagged you also ashley since I will not be here tomorrow.   Thanks

## 2018-05-22 ENCOUNTER — Encounter (HOSPITAL_COMMUNITY)
Admission: RE | Admit: 2018-05-22 | Discharge: 2018-05-22 | Disposition: A | Discharge status: Home / Self Care | Payer: PPO | Source: Ambulatory Visit | Attending: Internal Medicine | Admitting: Internal Medicine

## 2018-05-22 DIAGNOSIS — M810 Age-related osteoporosis without current pathological fracture: Secondary | ICD-10-CM | POA: Insufficient documentation

## 2018-05-22 MED ORDER — DENOSUMAB 60 MG/ML ~~LOC~~ SOSY
60.0000 mg | PREFILLED_SYRINGE | Freq: Once | SUBCUTANEOUS | Status: AC
  Administered 2018-05-22: 60 mg via SUBCUTANEOUS

## 2018-05-22 MED ORDER — DENOSUMAB 60 MG/ML ~~LOC~~ SOSY
PREFILLED_SYRINGE | SUBCUTANEOUS | Status: AC
  Filled 2018-05-22: qty 1

## 2018-07-02 DIAGNOSIS — M19049 Primary osteoarthritis, unspecified hand: Secondary | ICD-10-CM | POA: Diagnosis not present

## 2018-07-02 DIAGNOSIS — Z7952 Long term (current) use of systemic steroids: Secondary | ICD-10-CM | POA: Diagnosis not present

## 2018-07-02 DIAGNOSIS — E663 Overweight: Secondary | ICD-10-CM | POA: Diagnosis not present

## 2018-07-02 DIAGNOSIS — M2550 Pain in unspecified joint: Secondary | ICD-10-CM | POA: Diagnosis not present

## 2018-07-02 DIAGNOSIS — Z0000 Encounter for general adult medical examination without abnormal findings: Secondary | ICD-10-CM | POA: Diagnosis not present

## 2018-07-02 DIAGNOSIS — M791 Myalgia, unspecified site: Secondary | ICD-10-CM | POA: Diagnosis not present

## 2018-07-02 DIAGNOSIS — N390 Urinary tract infection, site not specified: Secondary | ICD-10-CM | POA: Diagnosis not present

## 2018-07-02 DIAGNOSIS — Z853 Personal history of malignant neoplasm of breast: Secondary | ICD-10-CM | POA: Diagnosis not present

## 2018-07-02 DIAGNOSIS — E782 Mixed hyperlipidemia: Secondary | ICD-10-CM | POA: Diagnosis not present

## 2018-07-02 DIAGNOSIS — M810 Age-related osteoporosis without current pathological fracture: Secondary | ICD-10-CM | POA: Diagnosis not present

## 2018-07-02 DIAGNOSIS — Z23 Encounter for immunization: Secondary | ICD-10-CM | POA: Diagnosis not present

## 2018-07-02 DIAGNOSIS — M353 Polymyalgia rheumatica: Secondary | ICD-10-CM | POA: Diagnosis not present

## 2018-07-02 DIAGNOSIS — Z6826 Body mass index (BMI) 26.0-26.9, adult: Secondary | ICD-10-CM | POA: Diagnosis not present

## 2018-08-04 DIAGNOSIS — M791 Myalgia, unspecified site: Secondary | ICD-10-CM | POA: Diagnosis not present

## 2018-08-04 DIAGNOSIS — M2550 Pain in unspecified joint: Secondary | ICD-10-CM | POA: Diagnosis not present

## 2018-08-04 DIAGNOSIS — Z853 Personal history of malignant neoplasm of breast: Secondary | ICD-10-CM | POA: Diagnosis not present

## 2018-08-04 DIAGNOSIS — M810 Age-related osteoporosis without current pathological fracture: Secondary | ICD-10-CM | POA: Diagnosis not present

## 2018-08-04 DIAGNOSIS — N390 Urinary tract infection, site not specified: Secondary | ICD-10-CM | POA: Diagnosis not present

## 2018-08-20 DIAGNOSIS — M5126 Other intervertebral disc displacement, lumbar region: Secondary | ICD-10-CM | POA: Diagnosis not present

## 2018-08-20 DIAGNOSIS — M129 Arthropathy, unspecified: Secondary | ICD-10-CM | POA: Diagnosis not present

## 2018-08-20 DIAGNOSIS — E663 Overweight: Secondary | ICD-10-CM | POA: Diagnosis not present

## 2018-08-20 DIAGNOSIS — M48061 Spinal stenosis, lumbar region without neurogenic claudication: Secondary | ICD-10-CM | POA: Diagnosis not present

## 2018-08-20 DIAGNOSIS — Z7952 Long term (current) use of systemic steroids: Secondary | ICD-10-CM | POA: Diagnosis not present

## 2018-08-20 DIAGNOSIS — N1330 Unspecified hydronephrosis: Secondary | ICD-10-CM | POA: Diagnosis not present

## 2018-08-20 DIAGNOSIS — E782 Mixed hyperlipidemia: Secondary | ICD-10-CM | POA: Diagnosis not present

## 2018-08-20 DIAGNOSIS — N201 Calculus of ureter: Secondary | ICD-10-CM | POA: Diagnosis not present

## 2018-08-25 DIAGNOSIS — M2550 Pain in unspecified joint: Secondary | ICD-10-CM | POA: Diagnosis not present

## 2018-08-25 DIAGNOSIS — E8809 Other disorders of plasma-protein metabolism, not elsewhere classified: Secondary | ICD-10-CM | POA: Diagnosis not present

## 2018-08-25 DIAGNOSIS — M791 Myalgia, unspecified site: Secondary | ICD-10-CM | POA: Diagnosis not present

## 2018-08-25 DIAGNOSIS — M810 Age-related osteoporosis without current pathological fracture: Secondary | ICD-10-CM | POA: Diagnosis not present

## 2018-08-25 DIAGNOSIS — E782 Mixed hyperlipidemia: Secondary | ICD-10-CM | POA: Diagnosis not present

## 2018-08-25 DIAGNOSIS — C50911 Malignant neoplasm of unspecified site of right female breast: Secondary | ICD-10-CM | POA: Diagnosis not present

## 2018-08-25 DIAGNOSIS — N390 Urinary tract infection, site not specified: Secondary | ICD-10-CM | POA: Diagnosis not present

## 2018-08-25 DIAGNOSIS — Z853 Personal history of malignant neoplasm of breast: Secondary | ICD-10-CM | POA: Diagnosis not present

## 2018-08-25 DIAGNOSIS — M25551 Pain in right hip: Secondary | ICD-10-CM | POA: Diagnosis not present

## 2018-09-09 DIAGNOSIS — M810 Age-related osteoporosis without current pathological fracture: Secondary | ICD-10-CM | POA: Diagnosis not present

## 2018-09-09 DIAGNOSIS — Z79899 Other long term (current) drug therapy: Secondary | ICD-10-CM | POA: Diagnosis not present

## 2018-09-11 DIAGNOSIS — M353 Polymyalgia rheumatica: Secondary | ICD-10-CM | POA: Diagnosis not present

## 2018-09-11 DIAGNOSIS — E663 Overweight: Secondary | ICD-10-CM | POA: Diagnosis not present

## 2018-09-11 DIAGNOSIS — M810 Age-related osteoporosis without current pathological fracture: Secondary | ICD-10-CM | POA: Diagnosis not present

## 2018-09-11 DIAGNOSIS — Z79899 Other long term (current) drug therapy: Secondary | ICD-10-CM | POA: Diagnosis not present

## 2018-09-11 DIAGNOSIS — Z6828 Body mass index (BMI) 28.0-28.9, adult: Secondary | ICD-10-CM | POA: Diagnosis not present

## 2018-09-11 DIAGNOSIS — M150 Primary generalized (osteo)arthritis: Secondary | ICD-10-CM | POA: Diagnosis not present

## 2018-09-11 DIAGNOSIS — M25552 Pain in left hip: Secondary | ICD-10-CM | POA: Diagnosis not present

## 2018-09-17 DIAGNOSIS — Z0000 Encounter for general adult medical examination without abnormal findings: Secondary | ICD-10-CM | POA: Diagnosis not present

## 2018-10-03 ENCOUNTER — Inpatient Hospital Stay (HOSPITAL_COMMUNITY): Payer: PPO | Attending: Hematology

## 2018-10-03 ENCOUNTER — Other Ambulatory Visit: Payer: Self-pay

## 2018-10-03 DIAGNOSIS — Z170 Estrogen receptor positive status [ER+]: Secondary | ICD-10-CM | POA: Insufficient documentation

## 2018-10-03 DIAGNOSIS — D0511 Intraductal carcinoma in situ of right breast: Secondary | ICD-10-CM

## 2018-10-03 DIAGNOSIS — C50511 Malignant neoplasm of lower-outer quadrant of right female breast: Secondary | ICD-10-CM | POA: Diagnosis not present

## 2018-10-03 LAB — CBC WITH DIFFERENTIAL/PLATELET
Abs Immature Granulocytes: 0.03 10*3/uL (ref 0.00–0.07)
Basophils Absolute: 0 10*3/uL (ref 0.0–0.1)
Basophils Relative: 1 %
Eosinophils Absolute: 0.2 10*3/uL (ref 0.0–0.5)
Eosinophils Relative: 4 %
HCT: 43.1 % (ref 36.0–46.0)
Hemoglobin: 13.9 g/dL (ref 12.0–15.0)
Immature Granulocytes: 1 %
Lymphocytes Relative: 32 %
Lymphs Abs: 1.6 10*3/uL (ref 0.7–4.0)
MCH: 31 pg (ref 26.0–34.0)
MCHC: 32.3 g/dL (ref 30.0–36.0)
MCV: 96 fL (ref 80.0–100.0)
Monocytes Absolute: 0.4 10*3/uL (ref 0.1–1.0)
Monocytes Relative: 7 %
Neutro Abs: 2.7 10*3/uL (ref 1.7–7.7)
Neutrophils Relative %: 55 %
Platelets: 206 10*3/uL (ref 150–400)
RBC: 4.49 MIL/uL (ref 3.87–5.11)
RDW: 13.6 % (ref 11.5–15.5)
WBC: 5 10*3/uL (ref 4.0–10.5)
nRBC: 0 % (ref 0.0–0.2)

## 2018-10-03 LAB — COMPREHENSIVE METABOLIC PANEL
ALT: 24 U/L (ref 0–44)
AST: 24 U/L (ref 15–41)
Albumin: 4.1 g/dL (ref 3.5–5.0)
Alkaline Phosphatase: 44 U/L (ref 38–126)
Anion gap: 10 (ref 5–15)
BUN: 16 mg/dL (ref 8–23)
CO2: 25 mmol/L (ref 22–32)
Calcium: 8.8 mg/dL — ABNORMAL LOW (ref 8.9–10.3)
Chloride: 104 mmol/L (ref 98–111)
Creatinine, Ser: 0.73 mg/dL (ref 0.44–1.00)
GFR calc Af Amer: >60 mL/min (ref >60)
GFR calc non Af Amer: >60 mL/min (ref >60)
Glucose, Bld: 104 mg/dL — ABNORMAL HIGH (ref 70–99)
Potassium: 4.7 mmol/L (ref 3.5–5.1)
Sodium: 139 mmol/L (ref 135–145)
Total Bilirubin: 1.1 mg/dL (ref 0.3–1.2)
Total Protein: 7 g/dL (ref 6.5–8.1)

## 2018-10-09 ENCOUNTER — Other Ambulatory Visit (HOSPITAL_COMMUNITY): Payer: Self-pay | Admitting: Hematology

## 2018-10-09 ENCOUNTER — Encounter (HOSPITAL_COMMUNITY): Payer: Self-pay | Admitting: Hematology

## 2018-10-09 ENCOUNTER — Other Ambulatory Visit: Payer: Self-pay

## 2018-10-09 ENCOUNTER — Inpatient Hospital Stay (HOSPITAL_COMMUNITY): Payer: PPO | Attending: Hematology | Admitting: Hematology

## 2018-10-09 VITALS — BP 163/55 | HR 73 | Temp 98.5°F | Resp 16 | Wt 170.4 lb

## 2018-10-09 DIAGNOSIS — Z808 Family history of malignant neoplasm of other organs or systems: Secondary | ICD-10-CM | POA: Diagnosis not present

## 2018-10-09 DIAGNOSIS — Z90710 Acquired absence of both cervix and uterus: Secondary | ICD-10-CM | POA: Insufficient documentation

## 2018-10-09 DIAGNOSIS — E7800 Pure hypercholesterolemia, unspecified: Secondary | ICD-10-CM

## 2018-10-09 DIAGNOSIS — M810 Age-related osteoporosis without current pathological fracture: Secondary | ICD-10-CM | POA: Diagnosis not present

## 2018-10-09 DIAGNOSIS — M353 Polymyalgia rheumatica: Secondary | ICD-10-CM | POA: Insufficient documentation

## 2018-10-09 DIAGNOSIS — Z79810 Long term (current) use of selective estrogen receptor modulators (SERMs): Secondary | ICD-10-CM | POA: Diagnosis not present

## 2018-10-09 DIAGNOSIS — Z923 Personal history of irradiation: Secondary | ICD-10-CM | POA: Diagnosis not present

## 2018-10-09 DIAGNOSIS — Z79899 Other long term (current) drug therapy: Secondary | ICD-10-CM | POA: Diagnosis not present

## 2018-10-09 DIAGNOSIS — Z170 Estrogen receptor positive status [ER+]: Secondary | ICD-10-CM | POA: Diagnosis not present

## 2018-10-09 DIAGNOSIS — D0511 Intraductal carcinoma in situ of right breast: Secondary | ICD-10-CM

## 2018-10-09 DIAGNOSIS — K219 Gastro-esophageal reflux disease without esophagitis: Secondary | ICD-10-CM | POA: Insufficient documentation

## 2018-10-09 DIAGNOSIS — Z87440 Personal history of urinary (tract) infections: Secondary | ICD-10-CM | POA: Insufficient documentation

## 2018-10-09 MED ORDER — TAMOXIFEN CITRATE 20 MG PO TABS: 20.0000 mg | ORAL_TABLET | Freq: Every day | ORAL | 6 refills | Status: DC

## 2018-10-09 NOTE — Progress Notes (Signed)
[place], [place] [address]   CLINIC:  Medical Oncology/Hematology  PCP:  Celene Squibb, MD [place] [address] [phone]   REASON FOR VISIT:  Follow-up for Right breast DCIS    INTERVAL HISTORY:  [name] 84 y.o. female returns for routine follow-up. She sis here today alone. She states that she continues to take the tamoxifen as prescribed with no missed doses. Denies any nausea, vomiting, or diarrhea. Denies any new pains. Had not noticed any recent bleeding such as epistaxis, hematuria or hematochezia. Denies recent chest pain on exertion, shortness of breath on minimal exertion, pre-syncopal episodes, or palpitations. Denies any numbness or tingling in hands or feet. Denies any recent fevers, infections, or recent hospitalizations. Patient reports appetite at 100 % and energy level at 50 %.     REVIEW OF SYSTEMS:  Review of Systems  All other systems reviewed and are negative.    PAST MEDICAL/SURGICAL HISTORY:  Past Medical History:  Diagnosis Date  . Bladder infection, chronic   . Cancer ([place]) 02/2018   right breast cancer  . Complication of anesthesia   . GERD (gastroesophageal reflux disease)   . Hemorrhoids   . Hypercholesterolemia   . Polymyalgia rheumatica ([place])   . PONV (postoperative nausea and vomiting)   . S/P colonoscopy Jun 14, 2004   friable internal and external hemorrhoids, left-sided diverticula  . S/P endoscopy August 13, 2006   pale 1-3 mm nodules consistent with benign squamous papilloma, tiny hiatal hernia  . Serrated adenoma of colon    Past Surgical History:  Procedure Laterality Date  . ABDOMINAL HYSTERECTOMY    . BREAST LUMPECTOMY WITH RADIOACTIVE SEED LOCALIZATION Right 03/12/2018   Procedure: RIGHT BREAST LUMPECTOMY WITH RADIOACTIVE SEED LOCALIZATION;  Surgeon: Fanny Skates, MD;  Location: [facility];  Service: General;  Laterality: Right;  . CHOLECYSTECTOMY     . COLONOSCOPY  11/20/2010   Dr. Gala Romney- serrated adenomaL side diverticulosis, hemorrhoids  . COLONOSCOPY  06/14/04   friable internal and external hemorrhoids, left-sided diverticula  . COLONOSCOPY N/A 08/23/2016   Dr. Gala Romney: Diverticulosis, medium-sized grade 2 internal hemorrhoids.  . ESOPHAGOGASTRODUODENOSCOPY  08/13/06   pale 1-3 mm nodules consistent with benign squamous papilloma, tiny hiatal hernia  . ESOPHAGOGASTRODUODENOSCOPY N/A 08/23/2016   Dr. Gala Romney: Normal  . FLEXIBLE SIGMOIDOSCOPY  11/15/2011   Procedure: FLEXIBLE SIGMOIDOSCOPY;  Surgeon: Daneil Dolin, MD;  [facility];  Service: Endoscopy;  Laterality: N/A;  12:30PM  . WRIST FRACTURE SURGERY Right      SOCIAL HISTORY:  Social History   Socioeconomic History  . Marital status: Widowed    Spouse name: Not on file  . Number of children: 2  . Years of education: Not on file  . Highest education level: Not on file  Occupational History  . Not on file  Social Needs  . Financial resource strain: Not hard at all  . Food insecurity:    Worry: Never true    Inability: Never true  . Transportation needs:    Medical: No    Non-medical: No  Tobacco Use  . Smoking status: Never Smoker  . Smokeless tobacco: Never Used  Substance and Sexual Activity  . Alcohol use: Yes    Alcohol/week: 2.0 standard drinks    Types: 2 Glasses of wine per week    Comment: Drinks wine 2-3 days per week ( usually a couple of glasses each time)  .  Drug use: No  . Sexual activity: Not on file  Lifestyle  . Physical activity:    Days per week: 0 days    Minutes per session: 0 min  . Stress: To some extent  Relationships  . Social connections:    Talks on phone: More than three times a week    Gets together: Three times a week    Attends religious service: More than 4 times per year    Active member of club or organization: No    Attends meetings of clubs or organizations: Never    Relationship status: Widowed  . Intimate  partner violence:    Fear of current or ex partner: No    Emotionally abused: No    Physically abused: No    Forced sexual activity: No  Other Topics Concern  . Not on file  Social History Narrative  . Not on file    FAMILY HISTORY:  Family History  Problem Relation Age of Onset  . Thyroid cancer Mother   . Parkinson's disease Sister   . Thyroid disease Brother   . Aneurysm Sister   . Thyroid cancer Other   . Thyroid cancer Other   . Colon cancer Neg Hx     CURRENT MEDICATIONS:  Outpatient Encounter Medications as of 10/09/2018  Medication Sig  . Calcium Carb-Cholecalciferol (CALCIUM 600 + D PO) Take 1 tablet by mouth daily.   . cephALEXin (KEFLEX) 250 MG capsule Take 250 mg by mouth daily.   . Multiple Vitamin (MULTIVITAMIN WITH MINERALS) TABS tablet Take 1 tablet by mouth daily.  . polyethylene glycol (MIRALAX / GLYCOLAX) packet Take 17 g by mouth daily as needed.  . tamoxifen (NOLVADEX) 20 MG tablet Take 1 tablet (20 mg total) by mouth daily.  . Wheat Dextrin (BENEFIBER) POWD Take by mouth 2 (two) times daily. Takes 1 tbsp twice a day   . [DISCONTINUED] tamoxifen (NOLVADEX) 20 MG tablet Take 1 tablet (20 mg total) by mouth daily.  Marland Kitchen aspirin EC 81 MG tablet Take 81 mg by mouth daily.   . naproxen sodium (ALEVE) 220 MG tablet Take 220 mg by mouth daily as needed (pain).  . nitrofurantoin, macrocrystal-monohydrate, (MACROBID) 100 MG capsule    No facility-administered encounter medications on file as of 10/09/2018.     ALLERGIES:  Allergies  Allergen Reactions  . Fish Allergy Nausea And Vomiting  . Shrimp [Shellfish Allergy] Nausea And Vomiting     PHYSICAL EXAM:  ECOG Performance status: 1  Vitals:   10/09/18 1510  BP: (!) 163/55  Pulse: 73  Resp: 16  Temp: 98.5 F (36.9 C)  SpO2: 99%   Filed Weights   10/09/18 1510  Weight: 170 lb 6.4 oz (77.3 kg)    Physical Exam Vitals signs reviewed.  Constitutional:      Appearance: Normal appearance.   Cardiovascular:     Rate and Rhythm: Normal rate and regular rhythm.     Heart sounds: Normal heart sounds.  Pulmonary:     Effort: Pulmonary effort is normal.     Breath sounds: Normal breath sounds.  Abdominal:     General: There is no distension.     Palpations: Abdomen is soft. There is no mass.  Musculoskeletal:        General: No swelling.  Skin:    General: Skin is warm.  Neurological:     Mental Status: She is alert and oriented to person, place, and time.  Psychiatric:  Mood and Affect: Mood normal.        Behavior: Behavior normal.    Bilateral breast exam is within normal limits.  LABORATORY DATA:  I have reviewed the labs as listed.  CBC    Component Value Date/Time   WBC 5.0 10/03/2018 0947   RBC 4.49 10/03/2018 0947   HGB 13.9 10/03/2018 0947   HGB 13.8 09/13/2013 1842   HCT 43.1 10/03/2018 0947   HCT 41.3 09/13/2013 1842   PLT 206 10/03/2018 0947   PLT 231 09/13/2013 1842   MCV 96.0 10/03/2018 0947   MCV 93 09/13/2013 1842   MCH 31.0 10/03/2018 0947   MCHC 32.3 10/03/2018 0947   RDW 13.6 10/03/2018 0947   RDW 14.1 09/13/2013 1842   LYMPHSABS 1.6 10/03/2018 0947   LYMPHSABS 0.8 (L) 09/13/2013 1842   MONOABS 0.4 10/03/2018 0947   MONOABS 0.4 09/13/2013 1842   EOSABS 0.2 10/03/2018 0947   EOSABS 0.0 09/13/2013 1842   BASOSABS 0.0 10/03/2018 0947   BASOSABS 0.0 09/13/2013 1842   CMP Latest Ref Rng & Units 10/03/2018 04/07/2018 03/05/2018  Glucose 70 - 99 mg/dL 104(H) 102(H) 94  BUN 8 - 23 mg/dL 16 17 11   Creatinine 0.44 - 1.00 mg/dL 0.73 0.73 0.72  Sodium 135 - 145 mmol/L 139 135 139  Potassium 3.5 - 5.1 mmol/L 4.7 4.1 4.5  Chloride 98 - 111 mmol/L 104 104 107  CO2 22 - 32 mmol/L 25 24 27   Calcium 8.9 - 10.3 mg/dL 8.8(L) 8.7(L) 9.1  Total Protein 6.5 - 8.1 g/dL 7.0 6.5 6.4(L)  Total Bilirubin 0.3 - 1.2 mg/dL 1.1 0.9 0.9  Alkaline Phos 38 - 126 U/L 44 64 65  AST 15 - 41 U/L 24 23 22   ALT 0 - 44 U/L 24 17 19        DIAGNOSTIC IMAGING:   I have independently reviewed the scans and discussed with the patient.   I have reviewed Jeanette Lick LPN's note and agree with the documentation.  I personally performed a face-to-face visit, made revisions and my assessment and plan is as follows.    ASSESSMENT & PLAN:   Breast neoplasm, Tis (DCIS), right 1.  Right breast DCIS: - Screening mammogram on 02/03/2018 was abnormal. -Ultrasound of the right breast on 02/11/2018 shows mass measuring 0.5 x 0.4 x 0.5 cm, circumscribed hypoechoic in the 3:30 o'clock location of the right breast.  Right axilla ultrasound was negative for adenopathy. - Right breast core biopsy on 02/18/2018 shows ductal carcinoma with papillary configuration.  In the comment section, it notes that this may represent DCIS involving a papillary lesion or it may represent an intracystic papillary ductal carcinoma.  ER/PR was 95% positive. - Right breast lumpectomy on 03/12/2018, pathology showing intermediate grade DCIS, 0.6 cm, with areas showing papillary configuration.  Posterior margin was 0.2 cm.  No evidence of invasion. - She refused radiation therapy. -She was started on tamoxifen for 5 years on 04/09/2018. -She has tolerated tamoxifen very well except some minor joint pains.  Today's physical examination was within normal limits. -We will schedule her for mammogram in October.  We have refilled her tamoxifen.  We will see her back in 6 months for follow-up.  We reviewed her blood work which is within normal limits.  2.  Osteoporosis: -DEXA scan on 04/11/2018 shows T score of -2.9. -She has received some shots with Dr. [place] Crane but could not tolerate them. -She was told to follow-up with him.  She was also told  to take calcium and vitamin D supplements twice daily. -We will check a vitamin D level prior to next visit.       Orders placed this encounter:  Orders Placed This Encounter  Procedures  . MM DIAG BREAST TOMO BILATERAL  . CBC with Differential/Platelet   . Comprehensive metabolic panel  . Vitamin D 25 hydroxy      Derek Jack, MD [place] [phone]

## 2018-10-09 NOTE — Patient Instructions (Signed)
[place] at [facility] Discharge Instructions  You were seen today by Dr. Delton Coombes. He went over your recent lab results. He will get you scheduled for your mammogram in September. He will see you back in 6 months for labs and follow up.   Thank you for choosing [place] at [facility] to provide your oncology and hematology care.  To afford each patient quality time with our provider, please arrive at least 15 minutes before your scheduled appointment time.   If you have a lab appointment with the [facility] please come in thru the  Main Entrance and check in at the main information desk  You need to re-schedule your appointment should you arrive 10 or more minutes late.  We strive to give you quality time with our providers, and arriving late affects you and other patients whose appointments are after yours.  Also, if you no show three or more times for appointments you may be dismissed from the clinic at the providers discretion.     Again, thank you for choosing [facility].  Our hope is that these requests will decrease the amount of time that you wait before being seen by our physicians.       _____________________________________________________________  Should you have questions after your visit to [facility], please contact our office at [phone] between the hours of 8:00 a.m. and 4:30 p.m.  Voicemails left after 4:00 p.m. will not be returned until the following business day.  For prescription refill requests, have your pharmacy contact our office and allow 72 hours.    Cancer Center Support Programs:   > Cancer Support Group  2nd Tuesday of the month 1pm-2pm, Journey Room

## 2018-10-09 NOTE — Assessment & Plan Note (Addendum)
1.  Right breast DCIS: - Screening mammogram on 02/03/2018 was abnormal. -Ultrasound of the right breast on 02/11/2018 shows mass measuring 0.5 x 0.4 x 0.5 cm, circumscribed hypoechoic in the 3:30 o'clock location of the right breast.  Right axilla ultrasound was negative for adenopathy. - Right breast core biopsy on 02/18/2018 shows ductal carcinoma with papillary configuration.  In the comment section, it notes that this may represent DCIS involving a papillary lesion or it may represent an intracystic papillary ductal carcinoma.  ER/PR was 95% positive. - Right breast lumpectomy on 03/12/2018, pathology showing intermediate grade DCIS, 0.6 cm, with areas showing papillary configuration.  Posterior margin was 0.2 cm.  No evidence of invasion. - She refused radiation therapy. -She was started on tamoxifen for 5 years on 04/09/2018. -She has tolerated tamoxifen very well except some minor joint pains.  Today's physical examination was within normal limits. -We will schedule her for mammogram in October.  We have refilled her tamoxifen.  We will see her back in 6 months for follow-up.  We reviewed her blood work which is within normal limits.  2.  Osteoporosis: -DEXA scan on 04/11/2018 shows T score of -2.9. -She has received some shots with Dr. [place] Crane but could not tolerate them. -She was told to follow-up with him.  She was also told to take calcium and vitamin D supplements twice daily. -We will check a vitamin D level prior to next visit.

## 2018-10-10 ENCOUNTER — Ambulatory Visit (HOSPITAL_COMMUNITY): Payer: PPO | Admitting: Hematology

## 2018-11-20 ENCOUNTER — Encounter (HOSPITAL_COMMUNITY)
Admission: RE | Admit: 2018-11-20 | Discharge: 2018-11-20 | Disposition: A | Discharge status: Home / Self Care | Payer: PPO | Source: Ambulatory Visit | Attending: Internal Medicine | Admitting: Internal Medicine

## 2019-01-25 DIAGNOSIS — E782 Mixed hyperlipidemia: Secondary | ICD-10-CM | POA: Diagnosis not present

## 2019-01-25 DIAGNOSIS — E8809 Other disorders of plasma-protein metabolism, not elsewhere classified: Secondary | ICD-10-CM | POA: Diagnosis not present

## 2019-01-25 DIAGNOSIS — M791 Myalgia, unspecified site: Secondary | ICD-10-CM | POA: Diagnosis not present

## 2019-01-25 DIAGNOSIS — M810 Age-related osteoporosis without current pathological fracture: Secondary | ICD-10-CM | POA: Diagnosis not present

## 2019-01-25 DIAGNOSIS — M2550 Pain in unspecified joint: Secondary | ICD-10-CM | POA: Diagnosis not present

## 2019-01-25 DIAGNOSIS — N390 Urinary tract infection, site not specified: Secondary | ICD-10-CM | POA: Diagnosis not present

## 2019-01-25 DIAGNOSIS — Z853 Personal history of malignant neoplasm of breast: Secondary | ICD-10-CM | POA: Diagnosis not present

## 2019-01-25 DIAGNOSIS — C50911 Malignant neoplasm of unspecified site of right female breast: Secondary | ICD-10-CM | POA: Diagnosis not present

## 2019-02-10 ENCOUNTER — Inpatient Hospital Stay (HOSPITAL_COMMUNITY): Admission: RE | Admit: 2019-02-10 | Payer: PPO | Source: Ambulatory Visit

## 2019-02-10 ENCOUNTER — Other Ambulatory Visit (HOSPITAL_COMMUNITY): Payer: PPO

## 2019-02-10 ENCOUNTER — Ambulatory Visit (HOSPITAL_COMMUNITY)
Admission: RE | Admit: 2019-02-10 | Discharge: 2019-02-10 | Disposition: A | Discharge status: Home / Self Care | Payer: PPO | Source: Ambulatory Visit | Attending: Hematology | Admitting: Hematology

## 2019-02-10 ENCOUNTER — Ambulatory Visit (HOSPITAL_COMMUNITY): Admission: RE | Admit: 2019-02-10 | Payer: PPO | Source: Ambulatory Visit

## 2019-02-10 ENCOUNTER — Ambulatory Visit (HOSPITAL_COMMUNITY): Payer: PPO

## 2019-02-10 ENCOUNTER — Other Ambulatory Visit: Payer: Self-pay

## 2019-02-10 DIAGNOSIS — R928 Other abnormal and inconclusive findings on diagnostic imaging of breast: Secondary | ICD-10-CM | POA: Diagnosis not present

## 2019-02-10 DIAGNOSIS — D0511 Intraductal carcinoma in situ of right breast: Secondary | ICD-10-CM | POA: Diagnosis not present

## 2019-02-10 DIAGNOSIS — Z853 Personal history of malignant neoplasm of breast: Secondary | ICD-10-CM | POA: Diagnosis not present

## 2019-02-11 DIAGNOSIS — R7301 Impaired fasting glucose: Secondary | ICD-10-CM | POA: Diagnosis not present

## 2019-02-11 DIAGNOSIS — E782 Mixed hyperlipidemia: Secondary | ICD-10-CM | POA: Diagnosis not present

## 2019-02-11 DIAGNOSIS — E663 Overweight: Secondary | ICD-10-CM | POA: Diagnosis not present

## 2019-02-11 DIAGNOSIS — Z6826 Body mass index (BMI) 26.0-26.9, adult: Secondary | ICD-10-CM | POA: Diagnosis not present

## 2019-02-11 DIAGNOSIS — Z0000 Encounter for general adult medical examination without abnormal findings: Secondary | ICD-10-CM | POA: Diagnosis not present

## 2019-02-11 DIAGNOSIS — M791 Myalgia, unspecified site: Secondary | ICD-10-CM | POA: Diagnosis not present

## 2019-02-11 DIAGNOSIS — M810 Age-related osteoporosis without current pathological fracture: Secondary | ICD-10-CM | POA: Diagnosis not present

## 2019-02-11 DIAGNOSIS — N390 Urinary tract infection, site not specified: Secondary | ICD-10-CM | POA: Diagnosis not present

## 2019-02-11 DIAGNOSIS — M2550 Pain in unspecified joint: Secondary | ICD-10-CM | POA: Diagnosis not present

## 2019-02-11 DIAGNOSIS — Z853 Personal history of malignant neoplasm of breast: Secondary | ICD-10-CM | POA: Diagnosis not present

## 2019-02-11 DIAGNOSIS — M353 Polymyalgia rheumatica: Secondary | ICD-10-CM | POA: Diagnosis not present

## 2019-02-11 DIAGNOSIS — M19049 Primary osteoarthritis, unspecified hand: Secondary | ICD-10-CM | POA: Diagnosis not present

## 2019-02-11 DIAGNOSIS — C50311 Malignant neoplasm of lower-inner quadrant of right female breast: Secondary | ICD-10-CM | POA: Diagnosis not present

## 2019-02-16 DIAGNOSIS — Z853 Personal history of malignant neoplasm of breast: Secondary | ICD-10-CM | POA: Diagnosis not present

## 2019-02-16 DIAGNOSIS — M810 Age-related osteoporosis without current pathological fracture: Secondary | ICD-10-CM | POA: Diagnosis not present

## 2019-02-16 DIAGNOSIS — M25551 Pain in right hip: Secondary | ICD-10-CM | POA: Diagnosis not present

## 2019-02-16 DIAGNOSIS — N390 Urinary tract infection, site not specified: Secondary | ICD-10-CM | POA: Diagnosis not present

## 2019-02-16 DIAGNOSIS — M2550 Pain in unspecified joint: Secondary | ICD-10-CM | POA: Diagnosis not present

## 2019-02-16 DIAGNOSIS — M791 Myalgia, unspecified site: Secondary | ICD-10-CM | POA: Diagnosis not present

## 2019-02-16 DIAGNOSIS — C50911 Malignant neoplasm of unspecified site of right female breast: Secondary | ICD-10-CM | POA: Diagnosis not present

## 2019-02-16 DIAGNOSIS — E782 Mixed hyperlipidemia: Secondary | ICD-10-CM | POA: Diagnosis not present

## 2019-02-16 DIAGNOSIS — H6981 Other specified disorders of Eustachian tube, right ear: Secondary | ICD-10-CM | POA: Diagnosis not present

## 2019-02-26 ENCOUNTER — Other Ambulatory Visit: Payer: Self-pay | Admitting: *Deleted

## 2019-02-26 DIAGNOSIS — Z20822 Contact with and (suspected) exposure to covid-19: Secondary | ICD-10-CM

## 2019-02-28 LAB — NOVEL CORONAVIRUS, NAA: SARS-CoV-2, NAA: NOT DETECTED

## 2019-03-02 ENCOUNTER — Telehealth: Payer: Self-pay | Admitting: *Deleted

## 2019-03-02 NOTE — Telephone Encounter (Signed)
Calling for [place] results. None available at this time.

## 2019-03-03 ENCOUNTER — Telehealth: Payer: Self-pay

## 2019-03-03 NOTE — Telephone Encounter (Signed)
Pt. Checking on COVID 19 results . Not available yet. 

## 2019-03-17 DIAGNOSIS — C50311 Malignant neoplasm of lower-inner quadrant of right female breast: Secondary | ICD-10-CM | POA: Diagnosis not present

## 2019-03-19 DIAGNOSIS — M810 Age-related osteoporosis without current pathological fracture: Secondary | ICD-10-CM | POA: Diagnosis not present

## 2019-03-26 DIAGNOSIS — M810 Age-related osteoporosis without current pathological fracture: Secondary | ICD-10-CM | POA: Diagnosis not present

## 2019-03-26 DIAGNOSIS — Z853 Personal history of malignant neoplasm of breast: Secondary | ICD-10-CM | POA: Diagnosis not present

## 2019-03-26 DIAGNOSIS — M791 Myalgia, unspecified site: Secondary | ICD-10-CM | POA: Diagnosis not present

## 2019-03-26 DIAGNOSIS — E8809 Other disorders of plasma-protein metabolism, not elsewhere classified: Secondary | ICD-10-CM | POA: Diagnosis not present

## 2019-03-26 DIAGNOSIS — N390 Urinary tract infection, site not specified: Secondary | ICD-10-CM | POA: Diagnosis not present

## 2019-03-26 DIAGNOSIS — E782 Mixed hyperlipidemia: Secondary | ICD-10-CM | POA: Diagnosis not present

## 2019-03-26 DIAGNOSIS — C50911 Malignant neoplasm of unspecified site of right female breast: Secondary | ICD-10-CM | POA: Diagnosis not present

## 2019-03-26 DIAGNOSIS — M2550 Pain in unspecified joint: Secondary | ICD-10-CM | POA: Diagnosis not present

## 2019-04-06 ENCOUNTER — Inpatient Hospital Stay (HOSPITAL_COMMUNITY): Payer: PPO | Attending: Hematology

## 2019-04-06 ENCOUNTER — Other Ambulatory Visit: Payer: Self-pay

## 2019-04-06 DIAGNOSIS — Z170 Estrogen receptor positive status [ER+]: Secondary | ICD-10-CM | POA: Diagnosis not present

## 2019-04-06 DIAGNOSIS — C50911 Malignant neoplasm of unspecified site of right female breast: Secondary | ICD-10-CM | POA: Insufficient documentation

## 2019-04-06 DIAGNOSIS — D0511 Intraductal carcinoma in situ of right breast: Secondary | ICD-10-CM

## 2019-04-06 LAB — COMPREHENSIVE METABOLIC PANEL
ALT: 22 U/L (ref 0–44)
AST: 24 U/L (ref 15–41)
Albumin: 4 g/dL (ref 3.5–5.0)
Alkaline Phosphatase: 37 U/L — ABNORMAL LOW (ref 38–126)
Anion gap: 9 (ref 5–15)
BUN: 18 mg/dL (ref 8–23)
CO2: 25 mmol/L (ref 22–32)
Calcium: 8.8 mg/dL — ABNORMAL LOW (ref 8.9–10.3)
Chloride: 107 mmol/L (ref 98–111)
Creatinine, Ser: 0.76 mg/dL (ref 0.44–1.00)
GFR calc Af Amer: >60 mL/min (ref >60)
GFR calc non Af Amer: >60 mL/min (ref >60)
Glucose, Bld: 109 mg/dL — ABNORMAL HIGH (ref 70–99)
Potassium: 4.7 mmol/L (ref 3.5–5.1)
Sodium: 141 mmol/L (ref 135–145)
Total Bilirubin: 0.7 mg/dL (ref 0.3–1.2)
Total Protein: 6.5 g/dL (ref 6.5–8.1)

## 2019-04-06 LAB — VITAMIN D 25 HYDROXY (VIT D DEFICIENCY, FRACTURES): Vit D, 25-Hydroxy: 41.52 ng/mL (ref 30–100)

## 2019-04-06 LAB — CBC WITH DIFFERENTIAL/PLATELET
Abs Immature Granulocytes: 0.08 10*3/uL — ABNORMAL HIGH (ref 0.00–0.07)
Basophils Absolute: 0.1 10*3/uL (ref 0.0–0.1)
Basophils Relative: 2 %
Eosinophils Absolute: 0.1 10*3/uL (ref 0.0–0.5)
Eosinophils Relative: 2 %
HCT: 42.5 % (ref 36.0–46.0)
Hemoglobin: 13.4 g/dL (ref 12.0–15.0)
Immature Granulocytes: 2 %
Lymphocytes Relative: 37 %
Lymphs Abs: 1.4 10*3/uL (ref 0.7–4.0)
MCH: 30.1 pg (ref 26.0–34.0)
MCHC: 31.5 g/dL (ref 30.0–36.0)
MCV: 95.5 fL (ref 80.0–100.0)
Monocytes Absolute: 0.3 10*3/uL (ref 0.1–1.0)
Monocytes Relative: 7 %
Neutro Abs: 2 10*3/uL (ref 1.7–7.7)
Neutrophils Relative %: 50 %
Platelets: 210 10*3/uL (ref 150–400)
RBC: 4.45 MIL/uL (ref 3.87–5.11)
RDW: 13.4 % (ref 11.5–15.5)
WBC: 4 10*3/uL (ref 4.0–10.5)
nRBC: 0 % (ref 0.0–0.2)

## 2019-04-08 DIAGNOSIS — E782 Mixed hyperlipidemia: Secondary | ICD-10-CM | POA: Diagnosis not present

## 2019-04-08 DIAGNOSIS — N390 Urinary tract infection, site not specified: Secondary | ICD-10-CM | POA: Diagnosis not present

## 2019-04-08 DIAGNOSIS — M19049 Primary osteoarthritis, unspecified hand: Secondary | ICD-10-CM | POA: Diagnosis not present

## 2019-04-08 DIAGNOSIS — R030 Elevated blood-pressure reading, without diagnosis of hypertension: Secondary | ICD-10-CM | POA: Diagnosis not present

## 2019-04-10 ENCOUNTER — Other Ambulatory Visit: Payer: Self-pay

## 2019-04-10 ENCOUNTER — Ambulatory Visit (HOSPITAL_COMMUNITY): Payer: PPO | Admitting: Hematology

## 2019-04-10 ENCOUNTER — Encounter (HOSPITAL_COMMUNITY): Payer: Self-pay | Admitting: Nurse Practitioner

## 2019-04-10 ENCOUNTER — Inpatient Hospital Stay (HOSPITAL_COMMUNITY): Payer: PPO | Attending: Nurse Practitioner | Admitting: Nurse Practitioner

## 2019-04-10 DIAGNOSIS — D0511 Intraductal carcinoma in situ of right breast: Secondary | ICD-10-CM

## 2019-04-10 NOTE — Assessment & Plan Note (Addendum)
1.  Right breast DCIS: -Screening mammogram on 02/03/2018 was abnormal. -Ultrasound of the right breast on 02/11/2018 showed mass measuring 0.5 x 0.4 x 0.5 cm, circumscribed hypoechoic in the 330 o'clock location of the right breast.  Right axillary ultrasound was negative for adenopathy. -Right breast core biopsy on 02/18/2018 showed ductal carcinoma with papillary configuration.  In the comment section, it notes that this may represent DCIS involving the papillary lesion or it may represent an intracystic papillary ductal carcinoma.  ER/PR was 95% positive. -Right breast lumpectomy on 03/12/2018, pathology showing intermediate grade DCIS, 0.6 cm, with areas showing papillary configuration.  Posterior margin was 0.2 cm.  No evidence of invasion. -She refused radiation therapy. -She was started on tamoxifen for 5 years on 04/09/2018. -She has tolerated tamoxifen very well except some minor joint pains.  She also has reported hair thinning. -Mammogram on 02/10/2019 was B RADS category 2 benign -Labs done on 04/06/2019 were all WNL -She will follow-up in 6 months with repeat labs.  2.  Osteoporosis: -DEXA scan on 04/11/2018 showed T score of -2.9. -She received injections with Dr. [place] Crane but cannot tolerate them. -She was told to take calcium and vitamin D twice daily. -We will recheck levels at her next visit.

## 2019-04-10 NOTE — Progress Notes (Signed)
[place], [place] [address]   CLINIC:  Medical Oncology/Hematology  PCP:  Celene Squibb, MD [place] [address] [phone]   REASON FOR VISIT: Follow-up for breast cancer  CURRENT THERAPY: Tamoxifen  INTERVAL HISTORY:  [name] 84 y.o. female returns for routine follow-up for breast cancer.  Patient is taking her medication as prescribed.  She reports mild joint pain.  She also reports her hair is thinning.  She denies any new bone pain.  She denies any red bleeding prior to her melena. Denies any nausea, vomiting, or diarrhea. Denies any new pains. Had not noticed any recent bleeding such as epistaxis, hematuria or hematochezia. Denies recent chest pain on exertion, shortness of breath on minimal exertion, pre-syncopal episodes, or palpitations. Denies any numbness or tingling in hands or feet. Denies any recent fevers, infections, or recent hospitalizations. Patient reports appetite at 100% and energy level at 100%.  She is eating well maintain her weight at this time.    REVIEW OF SYSTEMS:  Review of Systems  Constitutional:       Hair thinning  Psychiatric/Behavioral: Positive for sleep disturbance.  All other systems reviewed and are negative.    PAST MEDICAL/SURGICAL HISTORY:  Past Medical History:  Diagnosis Date  . Bladder infection, chronic   . Cancer ([place]) 02/2018   right breast cancer  . Complication of anesthesia   . GERD (gastroesophageal reflux disease)   . Hemorrhoids   . Hypercholesterolemia   . Polymyalgia rheumatica ([place])   . PONV (postoperative nausea and vomiting)   . S/P colonoscopy Jun 14, 2004   friable internal and external hemorrhoids, left-sided diverticula  . S/P endoscopy August 13, 2006   pale 1-3 mm nodules consistent with benign squamous papilloma, tiny hiatal hernia  . Serrated adenoma of colon    Past Surgical History:  Procedure Laterality Date  . ABDOMINAL HYSTERECTOMY    .  BREAST LUMPECTOMY WITH RADIOACTIVE SEED LOCALIZATION Right 03/12/2018   Procedure: RIGHT BREAST LUMPECTOMY WITH RADIOACTIVE SEED LOCALIZATION;  Surgeon: Fanny Skates, MD;  Location: [facility];  Service: General;  Laterality: Right;  . CHOLECYSTECTOMY    . COLONOSCOPY  11/20/2010   Dr. Gala Romney- serrated adenomaL side diverticulosis, hemorrhoids  . COLONOSCOPY  06/14/04   friable internal and external hemorrhoids, left-sided diverticula  . COLONOSCOPY N/A 08/23/2016   Dr. Gala Romney: Diverticulosis, medium-sized grade 2 internal hemorrhoids.  . ESOPHAGOGASTRODUODENOSCOPY  08/13/06   pale 1-3 mm nodules consistent with benign squamous papilloma, tiny hiatal hernia  . ESOPHAGOGASTRODUODENOSCOPY N/A 08/23/2016   Dr. Gala Romney: Normal  . FLEXIBLE SIGMOIDOSCOPY  11/15/2011   Procedure: FLEXIBLE SIGMOIDOSCOPY;  Surgeon: Daneil Dolin, MD;  [facility];  Service: Endoscopy;  Laterality: N/A;  12:30PM  . WRIST FRACTURE SURGERY Right      SOCIAL HISTORY:  Social History   Socioeconomic History  . Marital status: Widowed    Spouse name: Not on file  . Number of children: 2  . Years of education: Not on file  . Highest education level: Not on file  Occupational History  . Not on file  Social Needs  . Financial resource strain: Not hard at all  . Food insecurity    Worry: Never true    Inability: Never true  . Transportation needs    Medical: No    Non-medical: No  Tobacco Use  . Smoking status: Never Smoker  . Smokeless tobacco: Never Used  Substance and Sexual Activity  . Alcohol use: Yes    Alcohol/week: 2.0 standard drinks    Types: 2 Glasses of wine per week    Comment: Drinks wine 2-3 days per week ( usually a couple of glasses each time)  . Drug use: No  . Sexual activity: Not on file  Lifestyle  . Physical activity    Days per week: 0 days    Minutes per session: 0 min  . Stress: To some extent  Relationships  . Social connections    Talks on phone: More  than three times a week    Gets together: Three times a week    Attends religious service: More than 4 times per year    Active member of club or organization: No    Attends meetings of clubs or organizations: Never    Relationship status: Widowed  . Intimate partner violence    Fear of current or ex partner: No    Emotionally abused: No    Physically abused: No    Forced sexual activity: No  Other Topics Concern  . Not on file  Social History Narrative  . Not on file    FAMILY HISTORY:  Family History  Problem Relation Age of Onset  . Thyroid cancer Mother   . Parkinson's disease Sister   . Thyroid disease Brother   . Aneurysm Sister   . Thyroid cancer Other   . Thyroid cancer Other   . Colon cancer Neg Hx     CURRENT MEDICATIONS:  Outpatient Encounter Medications as of 04/10/2019  Medication Sig  . aspirin EC 81 MG tablet Take 81 mg by mouth daily.   . Calcium Carb-Cholecalciferol (CALCIUM 600 + D PO) Take 1 tablet by mouth daily.   . Multiple Vitamin (MULTIVITAMIN WITH MINERALS) TABS tablet Take 1 tablet by mouth daily.  . nitrofurantoin, macrocrystal-monohydrate, (MACROBID) 100 MG capsule   . tamoxifen (NOLVADEX) 20 MG tablet Take 1 tablet (20 mg total) by mouth daily.  . Wheat Dextrin (BENEFIBER) POWD Take by mouth 2 (two) times daily. Takes 1 tbsp twice a day   . [DISCONTINUED] cephALEXin (KEFLEX) 250 MG capsule Take 250 mg by mouth daily.   . naproxen sodium (ALEVE) 220 MG tablet Take 220 mg by mouth daily as needed (pain).  . polyethylene glycol (MIRALAX / GLYCOLAX) packet Take 17 g by mouth daily as needed.   No facility-administered encounter medications on file as of 04/10/2019.     ALLERGIES:  Allergies  Allergen Reactions  . Fish Allergy Nausea And Vomiting  . Shrimp [Shellfish Allergy] Nausea And Vomiting    Vital signs: -Deferred due to telephone visit  Physical Exam -Deferred due to telephone visit -Patient was alert and oriented over the phone  and in no acute distress.  LABORATORY DATA:  I have reviewed the labs as listed.  CBC    Component Value Date/Time   WBC 4.0 04/06/2019 1051   RBC 4.45 04/06/2019 1051   HGB 13.4 04/06/2019 1051   HGB 13.8 09/13/2013 1842   HCT 42.5 04/06/2019 1051   HCT 41.3 09/13/2013 1842   PLT 210 04/06/2019 1051   PLT 231 09/13/2013 1842   MCV 95.5 04/06/2019 1051   MCV 93 09/13/2013 1842   MCH 30.1 04/06/2019 1051   MCHC 31.5 04/06/2019 1051   RDW 13.4 04/06/2019 1051   RDW 14.1 09/13/2013 1842   LYMPHSABS 1.4 04/06/2019 1051   LYMPHSABS 0.8 (L) 09/13/2013 1842   MONOABS 0.3 04/06/2019  1051   MONOABS 0.4 09/13/2013 1842   EOSABS 0.1 04/06/2019 1051   EOSABS 0.0 09/13/2013 1842   BASOSABS 0.1 04/06/2019 1051   BASOSABS 0.0 09/13/2013 1842   CMP Latest Ref Rng & Units 04/06/2019 10/03/2018 04/07/2018  Glucose 70 - 99 mg/dL 109(H) 104(H) 102(H)  BUN 8 - 23 mg/dL 18 16 17   Creatinine 0.44 - 1.00 mg/dL 0.76 0.73 0.73  Sodium 135 - 145 mmol/L 141 139 135  Potassium 3.5 - 5.1 mmol/L 4.7 4.7 4.1  Chloride 98 - 111 mmol/L 107 104 104  CO2 22 - 32 mmol/L 25 25 24   Calcium 8.9 - 10.3 mg/dL 8.8(L) 8.8(L) 8.7(L)  Total Protein 6.5 - 8.1 g/dL 6.5 7.0 6.5  Total Bilirubin 0.3 - 1.2 mg/dL 0.7 1.1 0.9  Alkaline Phos 38 - 126 U/L 37(L) 44 64  AST 15 - 41 U/L 24 24 23   ALT 0 - 44 U/L 22 24 17     DIAGNOSTIC IMAGING:  I have independently reviewed the mammogram scans and discussed with the patient.   All questions were answered to patient's stated satisfaction. Encouraged patient to call with any new concerns or questions before his next visit to the cancer center and we can certain see him sooner, if needed.     ASSESSMENT & PLAN:   Breast neoplasm, Tis (DCIS), right 1.  Right breast DCIS: -Screening mammogram on 02/03/2018 was abnormal. -Ultrasound of the right breast on 02/11/2018 showed mass measuring 0.5 x 0.4 x 0.5 cm, circumscribed hypoechoic in the 330 o'clock location of the right  breast.  Right axillary ultrasound was negative for adenopathy. -Right breast core biopsy on 02/18/2018 showed ductal carcinoma with papillary configuration.  In the comment section, it notes that this may represent DCIS involving the papillary lesion or it may represent an intracystic papillary ductal carcinoma.  ER/PR was 95% positive. -Right breast lumpectomy on 03/12/2018, pathology showing intermediate grade DCIS, 0.6 cm, with areas showing papillary configuration.  Posterior margin was 0.2 cm.  No evidence of invasion. -She refused radiation therapy. -She was started on tamoxifen for 5 years on 04/09/2018. -She has tolerated tamoxifen very well except some minor joint pains.  She also has reported hair thinning. -Mammogram on 02/10/2019 was B RADS category 2 benign -Labs done on 04/06/2019 were all WNL -She will follow-up in 6 months with repeat labs.  2.  Osteoporosis: -DEXA scan on 04/11/2018 showed T score of -2.9. -She received injections with Dr. [place] Crane but cannot tolerate them. -She was told to take calcium and vitamin D twice daily. -We will recheck levels at her next visit.      Orders placed this encounter:  Orders Placed This Encounter  Procedures  . Lactate dehydrogenase  . CBC with Differential/Platelet  . Comprehensive metabolic panel  . Vitamin B12  . Vitamin D 25 hydroxy      Francene Finders, FNP-C [facility] [phone]

## 2019-05-11 DIAGNOSIS — E782 Mixed hyperlipidemia: Secondary | ICD-10-CM | POA: Diagnosis not present

## 2019-05-11 DIAGNOSIS — R030 Elevated blood-pressure reading, without diagnosis of hypertension: Secondary | ICD-10-CM | POA: Diagnosis not present

## 2019-05-11 DIAGNOSIS — M19049 Primary osteoarthritis, unspecified hand: Secondary | ICD-10-CM | POA: Diagnosis not present

## 2019-05-11 DIAGNOSIS — N390 Urinary tract infection, site not specified: Secondary | ICD-10-CM | POA: Diagnosis not present

## 2019-06-16 IMAGING — [facility] MM PLC BREAST LOC DEV 1ST LESION INC*R*
8 series · 8 of 8 positions shown · non-contrast
Comparison: Previous exam(s).

CLINICAL DATA: Preoperative radioactive seed localization, prior to
right breast lumpectomy.

EXAM:
MAMMOGRAPHIC GUIDED RADIOACTIVE SEED LOCALIZATION OF THE RIGHT
BREAST

[R CC (1 of 5)]
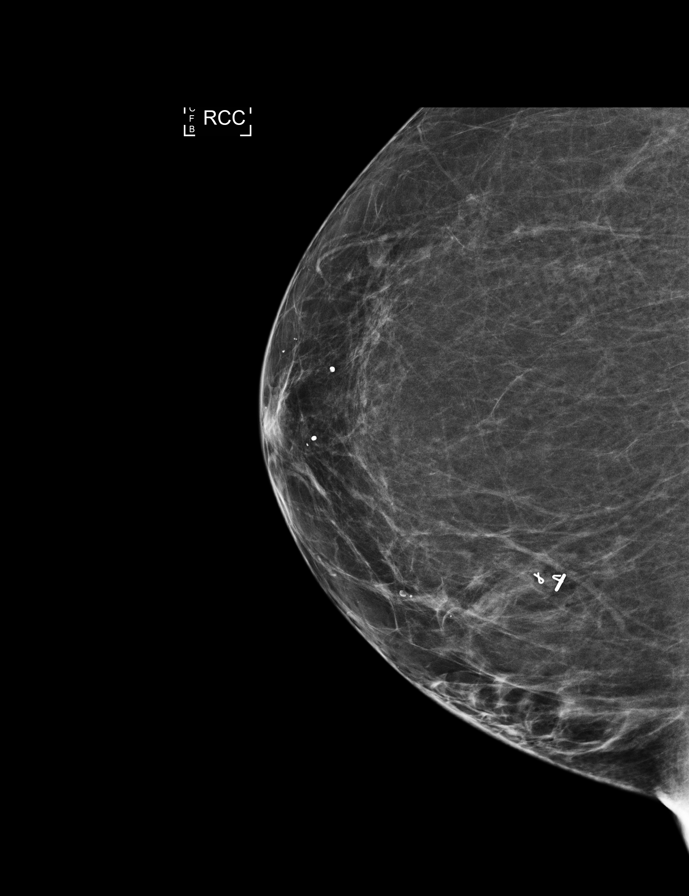

[R ML (1 of 3)]
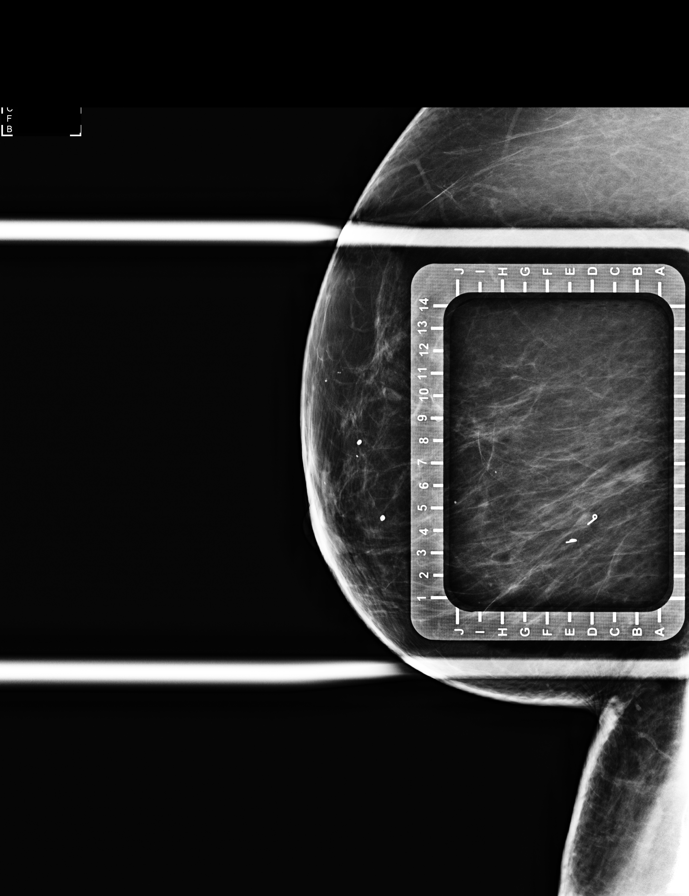

[R CC (2 of 5)]
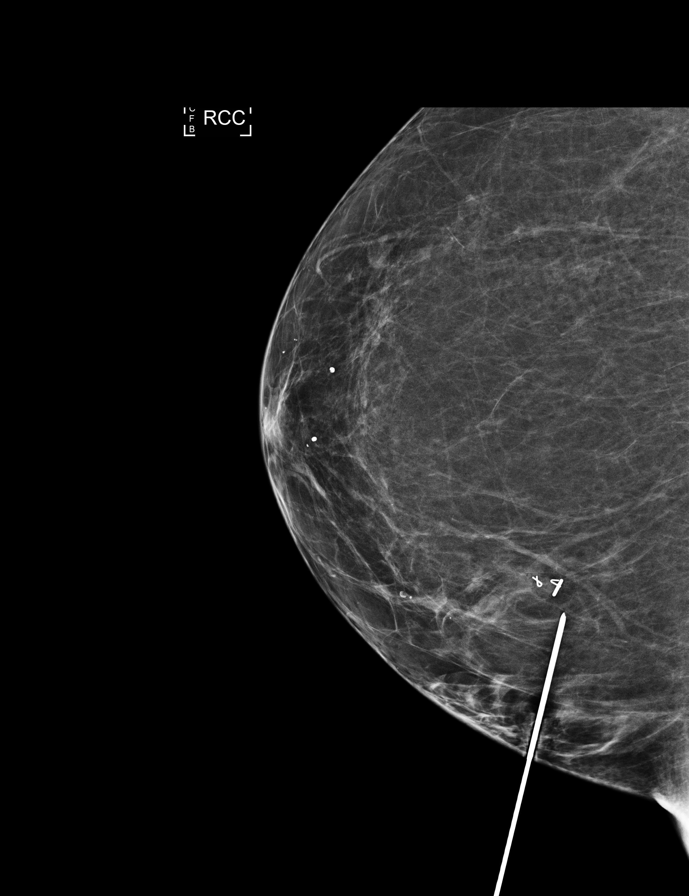

[R ML (2 of 3)]
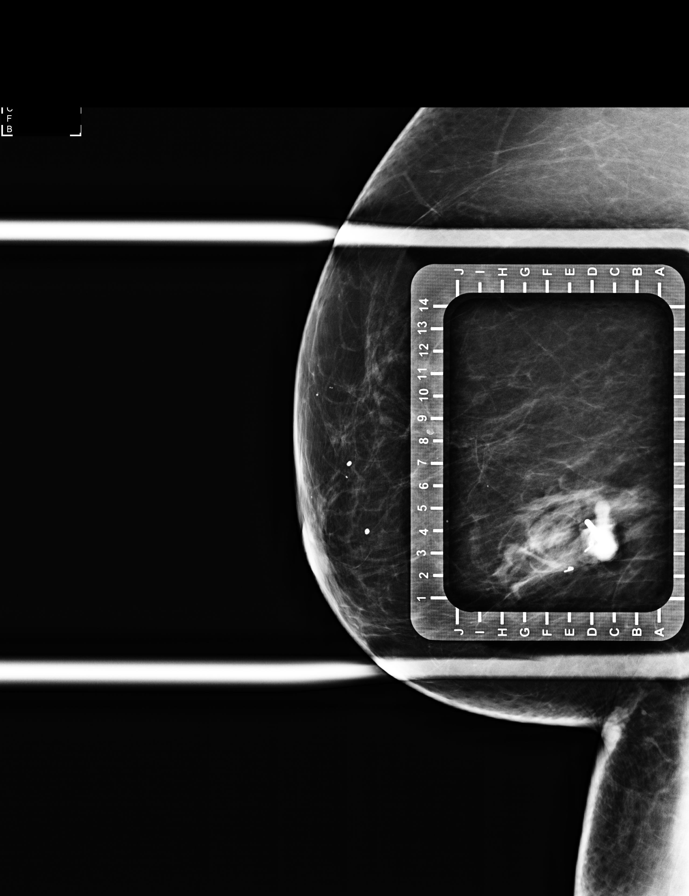

[R CC (3 of 5)]
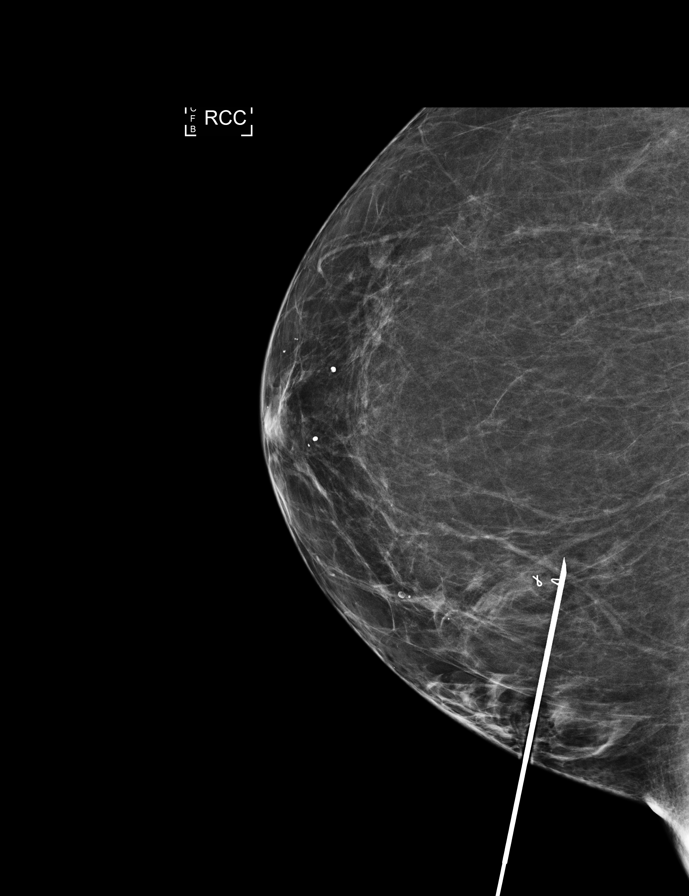

[R CC (4 of 5)]
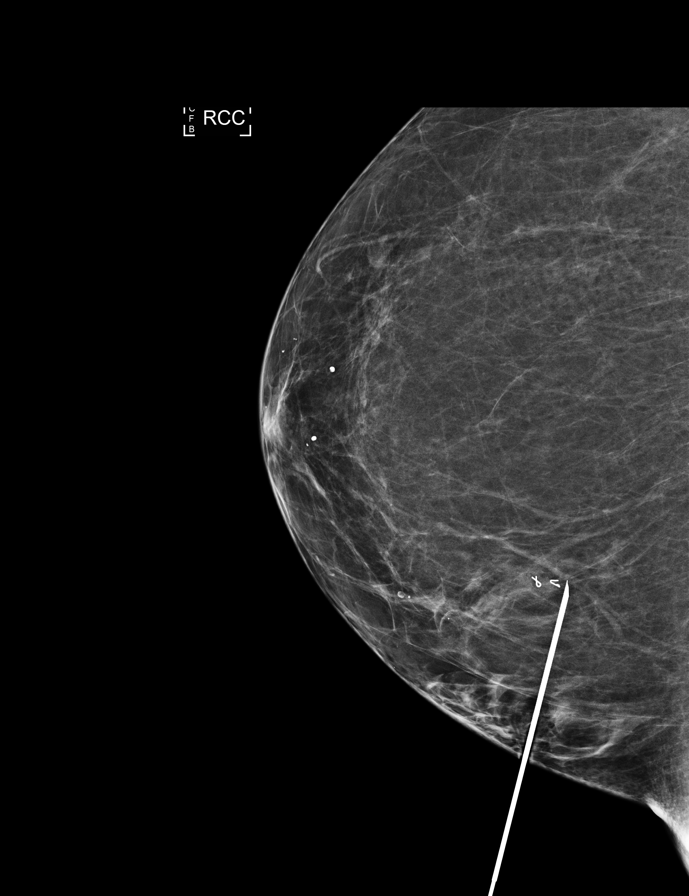

[R CC (5 of 5)]
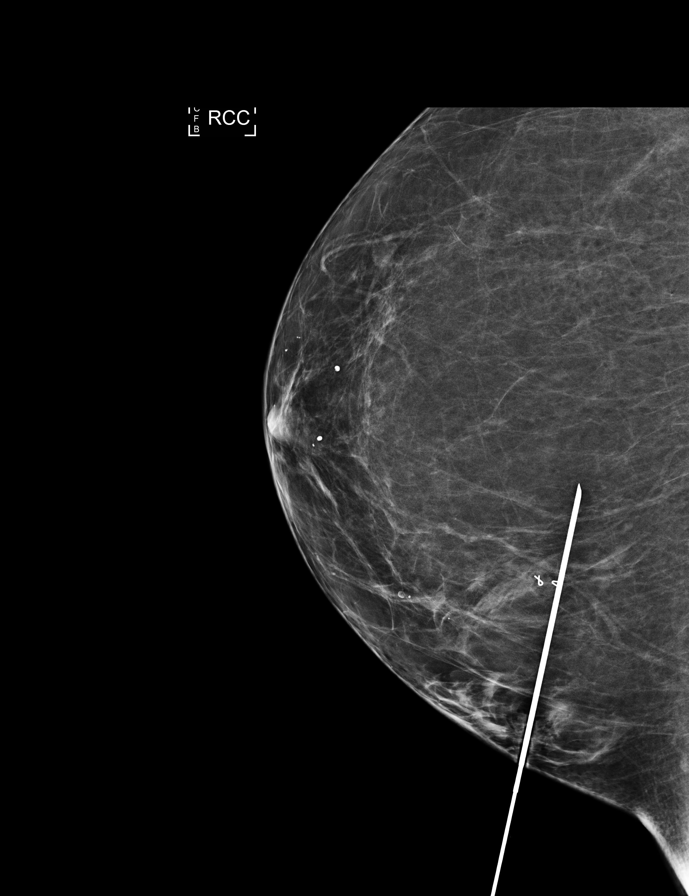

[R ML (3 of 3)]
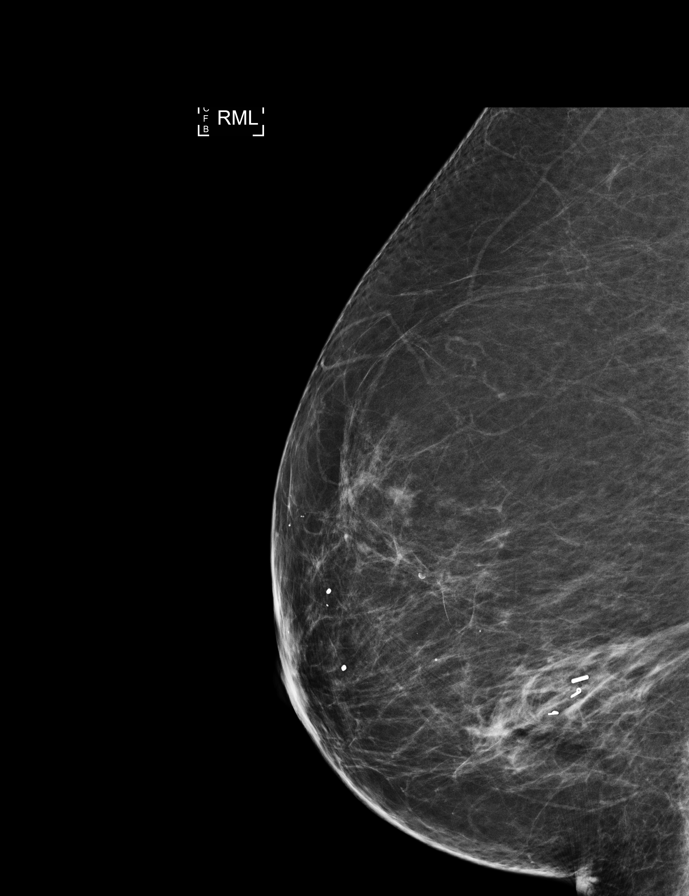

[8 of 8 positions shown; findings below may reference images not displayed]

FINDINGS: Patient presents for radioactive seed localization prior to right
breast lumpectomy. I met with the patient and we discussed the
procedure of seed localization including benefits and alternatives.
We discussed the high likelihood of a successful procedure. We
discussed the risks of the procedure including infection, bleeding,
tissue injury and further surgery. We discussed the low dose of
radioactivity involved in the procedure. Informed, written consent
was given.

The usual time-out protocol was performed immediately prior to the
procedure.

Using mammographic guidance, sterile technique, 1% lidocaine and an
[id number] radioactive seed, right breast 3:30 o'clock nodule containing
a wing shaped marker was localized using a medial approach. The
follow-up mammogram images confirm the seed in the expected location
and were marked for Dr. Maydjie Djoulie.

Follow-up survey of the patient confirms presence of the radioactive
seed.

Order number of [id number] seed:  [id number].

Total activity:  0.251 reference Date: February 28, 2018

The patient tolerated the procedure well and was released from the
[REDACTED]. She was given instructions regarding seed removal.
IMPRESSION: Radioactive seed localization right breast. No apparent
complications.

## 2019-07-29 DIAGNOSIS — M25512 Pain in left shoulder: Secondary | ICD-10-CM | POA: Diagnosis not present

## 2019-07-29 DIAGNOSIS — N390 Urinary tract infection, site not specified: Secondary | ICD-10-CM | POA: Diagnosis not present

## 2019-08-06 ENCOUNTER — Inpatient Hospital Stay (HOSPITAL_COMMUNITY): Payer: PPO | Attending: Hematology

## 2019-08-06 ENCOUNTER — Other Ambulatory Visit: Payer: Self-pay

## 2019-08-06 DIAGNOSIS — Z79810 Long term (current) use of selective estrogen receptor modulators (SERMs): Secondary | ICD-10-CM | POA: Diagnosis not present

## 2019-08-06 DIAGNOSIS — D0511 Intraductal carcinoma in situ of right breast: Secondary | ICD-10-CM | POA: Diagnosis not present

## 2019-08-06 DIAGNOSIS — Z170 Estrogen receptor positive status [ER+]: Secondary | ICD-10-CM | POA: Diagnosis not present

## 2019-08-06 LAB — CBC WITH DIFFERENTIAL/PLATELET
Abs Immature Granulocytes: 0.65 10*3/uL — ABNORMAL HIGH (ref 0.00–0.07)
Basophils Absolute: 0.4 10*3/uL — ABNORMAL HIGH (ref 0.0–0.1)
Basophils Relative: 4 %
Eosinophils Absolute: 0.1 10*3/uL (ref 0.0–0.5)
Eosinophils Relative: 1 %
HCT: 44.8 % (ref 36.0–46.0)
Hemoglobin: 14 g/dL (ref 12.0–15.0)
Immature Granulocytes: 8 %
Lymphocytes Relative: 22 %
Lymphs Abs: 1.9 10*3/uL (ref 0.7–4.0)
MCH: 30.2 pg (ref 26.0–34.0)
MCHC: 31.3 g/dL (ref 30.0–36.0)
MCV: 96.6 fL (ref 80.0–100.0)
Monocytes Absolute: 0.5 10*3/uL (ref 0.1–1.0)
Monocytes Relative: 6 %
Neutro Abs: 5.1 10*3/uL (ref 1.7–7.7)
Neutrophils Relative %: 59 %
Platelets: 254 10*3/uL (ref 150–400)
RBC: 4.64 MIL/uL (ref 3.87–5.11)
RDW: 13.4 % (ref 11.5–15.5)
WBC: 8.7 10*3/uL (ref 4.0–10.5)
nRBC: 0 % (ref 0.0–0.2)

## 2019-08-06 LAB — COMPREHENSIVE METABOLIC PANEL
ALT: 18 U/L (ref 0–44)
AST: 22 U/L (ref 15–41)
Albumin: 4.1 g/dL (ref 3.5–5.0)
Alkaline Phosphatase: 35 U/L — ABNORMAL LOW (ref 38–126)
Anion gap: 10 (ref 5–15)
BUN: 20 mg/dL (ref 8–23)
CO2: 26 mmol/L (ref 22–32)
Calcium: 9.2 mg/dL (ref 8.9–10.3)
Chloride: 103 mmol/L (ref 98–111)
Creatinine, Ser: 0.77 mg/dL (ref 0.44–1.00)
GFR calc Af Amer: >60 mL/min (ref >60)
GFR calc non Af Amer: >60 mL/min (ref >60)
Glucose, Bld: 109 mg/dL — ABNORMAL HIGH (ref 70–99)
Potassium: 4.5 mmol/L (ref 3.5–5.1)
Sodium: 139 mmol/L (ref 135–145)
Total Bilirubin: 1 mg/dL (ref 0.3–1.2)
Total Protein: 6.8 g/dL (ref 6.5–8.1)

## 2019-08-06 LAB — VITAMIN D 25 HYDROXY (VIT D DEFICIENCY, FRACTURES): Vit D, 25-Hydroxy: 39.63 ng/mL (ref 30–100)

## 2019-08-06 LAB — LACTATE DEHYDROGENASE: LDH: 188 U/L (ref 98–192)

## 2019-08-06 LAB — VITAMIN B12: Vitamin B-12: 913 pg/mL (ref 180–914)

## 2019-08-13 ENCOUNTER — Inpatient Hospital Stay (HOSPITAL_BASED_OUTPATIENT_CLINIC_OR_DEPARTMENT_OTHER): Payer: PPO | Admitting: Nurse Practitioner

## 2019-08-13 ENCOUNTER — Other Ambulatory Visit (HOSPITAL_COMMUNITY): Payer: Self-pay | Admitting: Nurse Practitioner

## 2019-08-13 ENCOUNTER — Other Ambulatory Visit: Payer: Self-pay

## 2019-08-13 DIAGNOSIS — D0511 Intraductal carcinoma in situ of right breast: Secondary | ICD-10-CM

## 2019-08-13 NOTE — Assessment & Plan Note (Signed)
1.  Right breast DCIS: -Screening mammogram on 02/03/2018 was abnormal. -Ultrasound of the right breast on 02/11/2018 showed mass measuring 0.5 x 0.4 x 0.5 cm, circumscribed hypoechoic in the 330 o'clock location of the right breast.  Right axillary ultrasound was negative for adenopathy. -Right breast core biopsy on 02/18/2018 showed ductal carcinoma with papillary configuration.  In the comment section, it notes that this may represent DCIS involving the papillary lesion or it may represent an intracystic papillary ductal carcinoma.  ER/PR was 95% positive. -Right breast lumpectomy on 03/12/2018, pathology showing intermediate grade DCIS, 0.6 cm, with areas showing papillary configuration.  Posterior margin was 0.2 cm.  No evidence of invasion. -She refused radiation therapy. -She was started on tamoxifen for 5 years on 04/09/2018. -She has tolerated tamoxifen very well except some minor joint pains.  She also has reported hair thinning. -Mammogram on 02/10/2019 was B RADS category 2 benign -Labs done on 08/06/2019 were all WNL -She will follow-up in 6 months with repeat labs and mammogram.  2.  Osteoporosis: -DEXA scan on 04/11/2018 showed T score of -2.9. -She received injections with Dr. Nevada Crane but cannot tolerate them. -She was told to take calcium and vitamin D twice daily. -We will recheck levels at her next visit.

## 2019-08-13 NOTE — Progress Notes (Signed)
Jean[place], [place] [address]   CLINIC:  Medical Oncology/Hematology  PCP:  Celene Squibb, MD [place] [address] [phone]   REASON FOR VISIT: Follow-up for breast cancer  CURRENT THERAPY: Tamoxifen   INTERVAL HISTORY:  [name] 85 y.o. female returns for routine follow-up for breast cancer.  Patient reports she has been doing well since her last visit.  She is taking her medication as prescribed.  She has no negative side effects.  She denies any new lumps or bumps present. Denies any nausea, vomiting, or diarrhea. Denies any new pains. Had not noticed any recent bleeding such as epistaxis, hematuria or hematochezia. Denies recent chest pain on exertion, shortness of breath on minimal exertion, pre-syncopal episodes, or palpitations. Denies any numbness or tingling in hands or feet. Denies any recent fevers, infections, or recent hospitalizations. Patient reports appetite at 100% and energy level at 75%.  She is eating well maintain her weight at this time.     REVIEW OF SYSTEMS:  Review of Systems  All other systems reviewed and are negative.    PAST MEDICAL/SURGICAL HISTORY:  Past Medical History:  Diagnosis Date  . Bladder infection, chronic   . Cancer ([facility]) 02/2018   right breast cancer  . Complication of anesthesia   . GERD (gastroesophageal reflux disease)   . Hemorrhoids   . Hypercholesterolemia   . Polymyalgia rheumatica ([facility])   . PONV (postoperative nausea and vomiting)   . S/P colonoscopy Jun 14, 2004   friable internal and external hemorrhoids, left-sided diverticula  . S/P endoscopy August 13, 2006   pale 1-3 mm nodules consistent with benign squamous papilloma, tiny hiatal hernia  . Serrated adenoma of colon    Past Surgical History:  Procedure Laterality Date  . ABDOMINAL HYSTERECTOMY    . BREAST LUMPECTOMY WITH RADIOACTIVE SEED LOCALIZATION Right 03/12/2018   Procedure: RIGHT BREAST LUMPECTOMY  WITH RADIOACTIVE SEED LOCALIZATION;  Surgeon: Fanny Skates, MD;  Location: [facility];  Service: General;  Laterality: Right;  . CHOLECYSTECTOMY    . COLONOSCOPY  11/20/2010   Dr. Gala Romney- serrated adenomaL side diverticulosis, hemorrhoids  . COLONOSCOPY  06/14/04   friable internal and external hemorrhoids, left-sided diverticula  . COLONOSCOPY N/A 08/23/2016   Dr. Gala Romney: Diverticulosis, medium-sized grade 2 internal hemorrhoids.  . ESOPHAGOGASTRODUODENOSCOPY  08/13/06   pale 1-3 mm nodules consistent with benign squamous papilloma, tiny hiatal hernia  . ESOPHAGOGASTRODUODENOSCOPY N/A 08/23/2016   Dr. Gala Romney: Normal  . FLEXIBLE SIGMOIDOSCOPY  11/15/2011   Procedure: FLEXIBLE SIGMOIDOSCOPY;  Surgeon: Daneil Dolin, MD;  [facility];  Service: Endoscopy;  Laterality: N/A;  12:30PM  . WRIST FRACTURE SURGERY Right      SOCIAL HISTORY:  Social History   Socioeconomic History  . Marital status: Widowed    Spouse name: Not on file  . Number of children: 2  . Years of education: Not on file  . Highest education level: Not on file  Occupational History  . Not on file  Tobacco Use  . Smoking status: Never Smoker  . Smokeless tobacco: Never Used  Substance and Sexual Activity  . Alcohol use: Yes    Alcohol/week: 2.0 standard drinks    Types: 2 Glasses of wine per week    Comment: Drinks wine 2-3 days per week ( usually a couple of glasses each time)  . Drug use: No  . Sexual activity: Not on file  Other  Topics Concern  . Not on file  Social History Narrative  . Not on file   Social Determinants of Health   Financial Resource Strain:   . Difficulty of Paying Living Expenses:   Food Insecurity:   . Worried About Charity fundraiser in the Last Year:   . Arboriculturist in the Last Year:   Transportation Needs:   . Film/video editor (Medical):   Marland [place] Lack of Transportation (Non-Medical):   Physical Activity:   . Days of Exercise per Week:   . Minutes  of Exercise per Session:   Stress:   . Feeling of Stress :   Social Connections:   . Frequency of Communication with Friends and Family:   . Frequency of Social Gatherings with Friends and Family:   . Attends Religious Services:   . Active Member of Clubs or Organizations:   . Attends Archivist Meetings:   Marland Kitchen Marital Status:   Intimate Partner Violence:   . Fear of Current or Ex-Partner:   . Emotionally Abused:   Marland Kitchen Physically Abused:   . Sexually Abused:     FAMILY HISTORY:  Family History  Problem Relation Age of Onset  . Thyroid cancer Mother   . Parkinson's disease Sister   . Thyroid disease Brother   . Aneurysm Sister   . Thyroid cancer Other   . Thyroid cancer Other   . Colon cancer Neg Hx     CURRENT MEDICATIONS:  Outpatient Encounter Medications as of 08/13/2019  Medication Sig  . aspirin EC 81 MG tablet Take 81 mg by mouth daily.   . Calcium Carb-Cholecalciferol (CALCIUM 600 + D PO) Take 1 tablet by mouth daily.   . Multiple Vitamin (MULTIVITAMIN WITH MINERALS) TABS tablet Take 1 tablet by mouth daily.  . naproxen sodium (ALEVE) 220 MG tablet Take 220 mg by mouth daily as needed (pain).  . nitrofurantoin, macrocrystal-monohydrate, (MACROBID) 100 MG capsule   . polyethylene glycol (MIRALAX / GLYCOLAX) packet Take 17 g by mouth daily as needed.  . tamoxifen (NOLVADEX) 20 MG tablet Take 1 tablet (20 mg total) by mouth daily.  . Wheat Dextrin (BENEFIBER) POWD Take by mouth 2 (two) times daily. Takes 1 tbsp twice a day    No facility-administered encounter medications on file as of 08/13/2019.    ALLERGIES:  Allergies  Allergen Reactions  . Fish Allergy Nausea And Vomiting  . Shrimp [Shellfish Allergy] Nausea And Vomiting    Vital signs: -Deferred due to telephone visit  Physical Exam -Deferred due to telephone visit -Patient was alert and oriented over the phone and in no acute distress.   LABORATORY DATA:  I have reviewed the labs as listed.   CBC    Component Value Date/Time   WBC 8.7 08/06/2019 1001   RBC 4.64 08/06/2019 1001   HGB 14.0 08/06/2019 1001   HGB 13.8 09/13/2013 1842   HCT 44.8 08/06/2019 1001   HCT 41.3 09/13/2013 1842   PLT 254 08/06/2019 1001   PLT 231 09/13/2013 1842   MCV 96.6 08/06/2019 1001   MCV 93 09/13/2013 1842   MCH 30.2 08/06/2019 1001   MCHC 31.3 08/06/2019 1001   RDW 13.4 08/06/2019 1001   RDW 14.1 09/13/2013 1842   LYMPHSABS 1.9 08/06/2019 1001   LYMPHSABS 0.8 (L) 09/13/2013 1842   MONOABS 0.5 08/06/2019 1001   MONOABS 0.4 09/13/2013 1842   EOSABS 0.1 08/06/2019 1001   EOSABS 0.0 09/13/2013 1842   BASOSABS 0.4 (  H) 08/06/2019 1001   BASOSABS 0.0 09/13/2013 1842   CMP Latest Ref Rng & Units 08/06/2019 04/06/2019 10/03/2018  Glucose 70 - 99 mg/dL 109(H) 109(H) 104(H)  BUN 8 - 23 mg/dL 20 18 16   Creatinine 0.44 - 1.00 mg/dL 0.77 0.76 0.73  Sodium 135 - 145 mmol/L 139 141 139  Potassium 3.5 - 5.1 mmol/L 4.5 4.7 4.7  Chloride 98 - 111 mmol/L 103 107 104  CO2 22 - 32 mmol/L 26 25 25   Calcium 8.9 - 10.3 mg/dL 9.2 8.8(L) 8.8(L)  Total Protein 6.5 - 8.1 g/dL 6.8 6.5 7.0  Total Bilirubin 0.3 - 1.2 mg/dL 1.0 0.7 1.1  Alkaline Phos 38 - 126 U/L 35(L) 37(L) 44  AST 15 - 41 U/L 22 24 24   ALT 0 - 44 U/L 18 22 24     All questions were answered to patient's stated satisfaction. Encouraged patient to call with any new concerns or questions before his next visit to the cancer center and we can certain see him sooner, if needed.     ASSESSMENT & PLAN:   Breast neoplasm, Tis (DCIS), right 1.  Right breast DCIS: -Screening mammogram on 02/03/2018 was abnormal. -Ultrasound of the right breast on 02/11/2018 showed mass measuring 0.5 x 0.4 x 0.5 cm, circumscribed hypoechoic in the 330 o'clock location of the right breast.  Right axillary ultrasound was negative for adenopathy. -Right breast core biopsy on 02/18/2018 showed ductal carcinoma with papillary configuration.  In the comment section, it notes  that this may represent DCIS involving the papillary lesion or it may represent an intracystic papillary ductal carcinoma.  ER/PR was 95% positive. -Right breast lumpectomy on 03/12/2018, pathology showing intermediate grade DCIS, 0.6 cm, with areas showing papillary configuration.  Posterior margin was 0.2 cm.  No evidence of invasion. -She refused radiation therapy. -She was started on tamoxifen for 5 years on 04/09/2018. -She has tolerated tamoxifen very well except some minor joint pains.  She also has reported hair thinning. -Mammogram on 02/10/2019 was B RADS category 2 benign -Labs done on 08/06/2019 were all WNL -She will follow-up in 6 months with repeat labs and mammogram.  2.  Osteoporosis: -DEXA scan on 04/11/2018 showed T score of -2.9. -She received injections with Dr. Nevada Crane but cannot tolerate them. -She was told to take calcium and vitamin D twice daily. -We will recheck levels at her next visit.      Orders placed this encounter:  Orders Placed This Encounter  Procedures  . MM DIAG BREAST TOMO BILATERAL  . Lactate dehydrogenase  . CBC with Differential/Platelet  . Comprehensive metabolic panel  . Vitamin B12  . VITAMIN D 25 Hydroxy (Vit-D Deficiency, Fractures)  . Folate    I provided 20 minutes of non face-to-face telephone visit time during this encounter, and > 50% was spent counseling as documented under my assessment & plan.  Francene Finders, FNP-C [place] [phone]

## 2019-08-19 DIAGNOSIS — M19049 Primary osteoarthritis, unspecified hand: Secondary | ICD-10-CM | POA: Diagnosis not present

## 2019-08-19 DIAGNOSIS — M791 Myalgia, unspecified site: Secondary | ICD-10-CM | POA: Diagnosis not present

## 2019-08-19 DIAGNOSIS — M25512 Pain in left shoulder: Secondary | ICD-10-CM | POA: Diagnosis not present

## 2019-08-19 DIAGNOSIS — E663 Overweight: Secondary | ICD-10-CM | POA: Diagnosis not present

## 2019-08-19 DIAGNOSIS — M810 Age-related osteoporosis without current pathological fracture: Secondary | ICD-10-CM | POA: Diagnosis not present

## 2019-08-19 DIAGNOSIS — H6981 Other specified disorders of Eustachian tube, right ear: Secondary | ICD-10-CM | POA: Diagnosis not present

## 2019-08-19 DIAGNOSIS — M353 Polymyalgia rheumatica: Secondary | ICD-10-CM | POA: Diagnosis not present

## 2019-08-19 DIAGNOSIS — C50911 Malignant neoplasm of unspecified site of right female breast: Secondary | ICD-10-CM | POA: Diagnosis not present

## 2019-08-19 DIAGNOSIS — E8809 Other disorders of plasma-protein metabolism, not elsewhere classified: Secondary | ICD-10-CM | POA: Diagnosis not present

## 2019-08-19 DIAGNOSIS — M2550 Pain in unspecified joint: Secondary | ICD-10-CM | POA: Diagnosis not present

## 2019-08-19 DIAGNOSIS — C50311 Malignant neoplasm of lower-inner quadrant of right female breast: Secondary | ICD-10-CM | POA: Diagnosis not present

## 2019-08-19 DIAGNOSIS — E782 Mixed hyperlipidemia: Secondary | ICD-10-CM | POA: Diagnosis not present

## 2019-08-24 DIAGNOSIS — Z853 Personal history of malignant neoplasm of breast: Secondary | ICD-10-CM | POA: Diagnosis not present

## 2019-08-24 DIAGNOSIS — Z0001 Encounter for general adult medical examination with abnormal findings: Secondary | ICD-10-CM | POA: Diagnosis not present

## 2019-08-24 DIAGNOSIS — M25551 Pain in right hip: Secondary | ICD-10-CM | POA: Diagnosis not present

## 2019-08-24 DIAGNOSIS — C50911 Malignant neoplasm of unspecified site of right female breast: Secondary | ICD-10-CM | POA: Diagnosis not present

## 2019-08-24 DIAGNOSIS — E782 Mixed hyperlipidemia: Secondary | ICD-10-CM | POA: Diagnosis not present

## 2019-08-24 DIAGNOSIS — M791 Myalgia, unspecified site: Secondary | ICD-10-CM | POA: Diagnosis not present

## 2019-08-24 DIAGNOSIS — M810 Age-related osteoporosis without current pathological fracture: Secondary | ICD-10-CM | POA: Diagnosis not present

## 2019-08-24 DIAGNOSIS — M25552 Pain in left hip: Secondary | ICD-10-CM | POA: Diagnosis not present

## 2019-08-24 DIAGNOSIS — M2550 Pain in unspecified joint: Secondary | ICD-10-CM | POA: Diagnosis not present

## 2019-08-24 DIAGNOSIS — N390 Urinary tract infection, site not specified: Secondary | ICD-10-CM | POA: Diagnosis not present

## 2019-08-24 DIAGNOSIS — H6981 Other specified disorders of Eustachian tube, right ear: Secondary | ICD-10-CM | POA: Diagnosis not present

## 2019-09-10 ENCOUNTER — Telehealth: Payer: Self-pay | Admitting: Orthopaedic Surgery

## 2019-09-10 NOTE — Telephone Encounter (Signed)
Patient had called stating her rheumatologist told her that she needed to be seen for hip/back pain.  She said he sent her for xrays last year.  She said she could bring those xrays by for one of the doctors to review.  I told her that would be fine.  She did bring those today and I have put them in Dr. Brooke Bonito box for him to review and advise if he would see this lady

## 2019-09-17 ENCOUNTER — Other Ambulatory Visit: Payer: Self-pay

## 2019-09-17 ENCOUNTER — Encounter: Payer: Self-pay | Admitting: Orthopaedic Surgery

## 2019-09-17 ENCOUNTER — Ambulatory Visit: Payer: PPO | Admitting: Orthopaedic Surgery

## 2019-09-17 VITALS — BP 126/63 | HR 73 | Ht 67.5 in | Wt 145.0 lb

## 2019-09-17 DIAGNOSIS — M545 Low back pain, unspecified: Secondary | ICD-10-CM

## 2019-09-17 DIAGNOSIS — M79605 Pain in left leg: Secondary | ICD-10-CM | POA: Diagnosis not present

## 2019-09-17 MED ORDER — PREDNISONE 5 MG (21) PO TBPK
ORAL_TABLET | ORAL | 0 refills | Status: DC
Start: 2019-09-17 — End: 2019-10-13

## 2019-09-17 NOTE — Progress Notes (Signed)
Subjective:    Patient ID: [name], female    DOB: [birth year], 85 y.o.   MRN: [id number]  HPI She has had lower back pain with radiation down the left leg to the foot for over a year.  She had x-rays done Sep 11, 2018 showing grade I spondylolisthesis at L4-L5 with narrowing at L5-S1.  She brought in her CD.  I have independently reviewed and interpreted x-rays of this patient done at another site by another physician or qualified health professional.  She was seen by Rheumatologist but was told to see an orthopedist about a possible pinched nerve in her back. Due to her concerns about COVID she delayed until now.  She has no trauma, no weakness.  The pain is still there and is worse some days more than others.  She had taken prednisone last year which helped.  She uses Tylenol now with little help.  I will give prednisone dose pack.  I will schedule MRI.   Review of Systems  Constitutional: Positive for activity change.  Musculoskeletal: Positive for arthralgias and back pain.  All other systems reviewed and are negative.  For Review of Systems, all other systems reviewed and are negative.  The following is a summary of the past history medically, past history surgically, known current medicines, social history and family history.  This information is gathered electronically by the computer from prior information and documentation.  I review this each visit and have found including this information at this point in the chart is beneficial and informative.   Past Medical History:  Diagnosis Date  . Bladder infection, chronic   . Cancer ([facility]) 02/2018   right breast cancer  . Complication of anesthesia   . GERD (gastroesophageal reflux disease)   . Hemorrhoids   . Hypercholesterolemia   . Polymyalgia rheumatica ([facility])   . PONV (postoperative nausea and vomiting)   . S/P colonoscopy Jun 14, 2004   friable internal and external hemorrhoids, left-sided diverticula  . S/P  endoscopy August 13, 2006   pale 1-3 mm nodules consistent with benign squamous papilloma, tiny hiatal hernia  . Serrated adenoma of colon     Past Surgical History:  Procedure Laterality Date  . ABDOMINAL HYSTERECTOMY    . BREAST LUMPECTOMY WITH RADIOACTIVE SEED LOCALIZATION Right 03/12/2018   Procedure: RIGHT BREAST LUMPECTOMY WITH RADIOACTIVE SEED LOCALIZATION;  Surgeon: Fanny Skates, MD;  Location: [facility];  Service: General;  Laterality: Right;  . CHOLECYSTECTOMY    . COLONOSCOPY  11/20/2010   Dr. Gala Romney- serrated adenomaL side diverticulosis, hemorrhoids  . COLONOSCOPY  06/14/04   friable internal and external hemorrhoids, left-sided diverticula  . COLONOSCOPY N/A 08/23/2016   Dr. Gala Romney: Diverticulosis, medium-sized grade 2 internal hemorrhoids.  . ESOPHAGOGASTRODUODENOSCOPY  08/13/06   pale 1-3 mm nodules consistent with benign squamous papilloma, tiny hiatal hernia  . ESOPHAGOGASTRODUODENOSCOPY N/A 08/23/2016   Dr. Gala Romney: Normal  . FLEXIBLE SIGMOIDOSCOPY  11/15/2011   Procedure: FLEXIBLE SIGMOIDOSCOPY;  Surgeon: Daneil Dolin, MD;  [facility];  Service: Endoscopy;  Laterality: N/A;  12:30PM  . WRIST FRACTURE SURGERY Right     Current Outpatient Medications on File Prior to Visit  Medication Sig Dispense Refill  . aspirin EC 81 MG tablet Take 81 mg by mouth daily.     . Calcium Carb-Cholecalciferol (CALCIUM 600 + D PO) Take 1 tablet by mouth daily.     . cephALEXin (KEFLEX) 250 MG capsule Take 250 mg by mouth  daily.    . ibuprofen (ADVIL) 200 MG tablet Take 200 mg by mouth every 6 (six) hours as needed.    . meloxicam (MOBIC) 7.5 MG tablet TAKE (1) TABLET BY MOUTHTONCE DAILY FOR 1 OR 2 WEEKS, THEN AS NEEDED.    . Multiple Vitamin (MULTIVITAMIN WITH MINERALS) TABS tablet Take 1 tablet by mouth daily.    . naproxen sodium (ALEVE) 220 MG tablet Take 220 mg by mouth daily as needed (pain).    . nitrofurantoin, macrocrystal-monohydrate, (MACROBID) 100 MG  capsule     . polyethylene glycol (MIRALAX / GLYCOLAX) packet Take 17 g by mouth daily as needed.    . tamoxifen (NOLVADEX) 20 MG tablet Take 1 tablet (20 mg total) by mouth daily. 90 tablet 6  . Wheat Dextrin (BENEFIBER) POWD Take by mouth 2 (two) times daily. Takes 1 tbsp twice a day      No current facility-administered medications on file prior to visit.    Social History   Socioeconomic History  . Marital status: Widowed    Spouse name: Not on file  . Number of children: 2  . Years of education: Not on file  . Highest education level: Not on file  Occupational History  . Not on file  Tobacco Use  . Smoking status: Never [place]  . Smokeless tobacco: Never Used  Substance and Sexual Activity  . Alcohol use: Yes    Alcohol/week: 2.0 standard drinks    Types: 2 Glasses of wine per week    Comment: Drinks wine 2-3 days per week ( usually a couple of glasses each time)  . Drug use: No  . Sexual activity: Not on file  Other Topics Concern  . Not on file  Social History Narrative  . Not on file   Social Determinants of Health   Financial Resource Strain:   . Difficulty of Paying Living Expenses:   Food Insecurity:   . Worried About Charity fundraiser in the Last Year:   . Arboriculturist in the Last Year:   Transportation Needs:   . Film/video editor (Medical):   Marland Kitchen Lack of Transportation (Non-Medical):   Physical Activity:   . Days of Exercise per Week:   . Minutes of Exercise per Session:   Stress:   . Feeling of Stress :   Social Connections:   . Frequency of Communication with Friends and Family:   . Frequency of Social Gatherings with Friends and Family:   . Attends Religious Services:   . Active Member of Clubs or Organizations:   . Attends Archivist Meetings:   Marland Kitchen Marital Status:   Intimate Partner Violence:   . Fear of Current or Ex-Partner:   . Emotionally Abused:   Marland Kitchen Physically Abused:   . Sexually Abused:     Family History    Problem Relation Age of Onset  . Thyroid cancer Mother   . Parkinson's disease Sister   . Thyroid disease Brother   . Aneurysm Sister   . Thyroid cancer Other   . Thyroid cancer Other   . Colon cancer Neg Hx     BP 126/63   Pulse 73   Ht 5' 7.5" (1.715 m)   Wt 145 lb (65.8 kg)   BMI 22.38 kg/m   Body mass index is 22.38 kg/m.     Objective:   Physical Exam Vitals and nursing note reviewed.  Constitutional:      Appearance: She is well-developed.  HENT:     Head: Normocephalic and atraumatic.  Eyes:     Conjunctiva/sclera: Conjunctivae normal.     Pupils: Pupils are equal, round, and reactive to light.  Cardiovascular:     Rate and Rhythm: Normal rate and regular rhythm.  Pulmonary:     Effort: Pulmonary effort is normal.  Abdominal:     Palpations: Abdomen is soft.  Musculoskeletal:       Arms:     Cervical back: Normal range of motion and neck supple.  Skin:    General: Skin is warm and dry.  Neurological:     Mental Status: She is alert and oriented to person, place, and time.     Cranial Nerves: No cranial nerve deficit.     Motor: No abnormal muscle tone.     Coordination: Coordination normal.     Deep Tendon Reflexes: Reflexes are normal and symmetric. Reflexes normal.  Psychiatric:        Behavior: Behavior normal.        Thought Content: Thought content normal.        Judgment: Judgment normal.           Assessment & Plan:   Encounter Diagnosis  Name Primary?  . Lumbar pain with radiation down left leg Yes   I will get MRI.  I am concerned about HNP on the lower back left side L5 or S1.  Prednisone dose pack called in.  Return in two weeks.  Call if any problem.  Precautions discussed.   Electronically Signed Sanjuana Kava, MD 5/13/202111:44 AM

## 2019-10-06 ENCOUNTER — Ambulatory Visit: Payer: PPO | Admitting: Orthopaedic Surgery

## 2019-10-08 DIAGNOSIS — E7849 Other hyperlipidemia: Secondary | ICD-10-CM | POA: Diagnosis not present

## 2019-10-08 DIAGNOSIS — M19049 Primary osteoarthritis, unspecified hand: Secondary | ICD-10-CM | POA: Diagnosis not present

## 2019-10-09 ENCOUNTER — Ambulatory Visit (HOSPITAL_COMMUNITY)
Admission: RE | Admit: 2019-10-09 | Discharge: 2019-10-09 | Disposition: A | Discharge status: Home / Self Care | Payer: PPO | Source: Ambulatory Visit | Attending: Orthopaedic Surgery | Admitting: Orthopaedic Surgery

## 2019-10-09 ENCOUNTER — Other Ambulatory Visit: Payer: Self-pay

## 2019-10-09 DIAGNOSIS — M545 Low back pain: Secondary | ICD-10-CM | POA: Insufficient documentation

## 2019-10-09 DIAGNOSIS — M79605 Pain in left leg: Secondary | ICD-10-CM | POA: Diagnosis not present

## 2019-10-09 DIAGNOSIS — M48061 Spinal stenosis, lumbar region without neurogenic claudication: Secondary | ICD-10-CM | POA: Diagnosis not present

## 2019-10-13 ENCOUNTER — Other Ambulatory Visit: Payer: Self-pay

## 2019-10-13 ENCOUNTER — Ambulatory Visit: Payer: PPO | Admitting: Orthopaedic Surgery

## 2019-10-13 ENCOUNTER — Encounter: Payer: Self-pay | Admitting: Orthopaedic Surgery

## 2019-10-13 VITALS — BP 127/71 | HR 79 | Ht 67.5 in | Wt 151.0 lb

## 2019-10-13 DIAGNOSIS — M810 Age-related osteoporosis without current pathological fracture: Secondary | ICD-10-CM

## 2019-10-13 DIAGNOSIS — M79605 Pain in left leg: Secondary | ICD-10-CM | POA: Diagnosis not present

## 2019-10-13 DIAGNOSIS — M545 Low back pain: Secondary | ICD-10-CM | POA: Diagnosis not present

## 2019-10-13 NOTE — Progress Notes (Signed)
Patient [name], female DOB:06/21/1934, 85 y.o. IWP:[id number]  Chief Complaint  Patient presents with  . Results    MRI Lumbar Spine    HPI  [name] is a 85 y.o. female who has lower back pain. She had MRI which showed: IMPRESSION: 1. Grade 1 anterolisthesis of L4 over L5 related to facet degeneration which in association with ligamentum flavum redundancy and disc bulge/disc uncovering results in moderate to severe spinal canal stenosis at this level. 2. Mild degenerative changes of the remainder of the lumbar spine, more significant at the level of the facet joints.  I have reviewed the results with her.  I have independently reviewed the MRI.  I have suggested epidurals and will get these scheduled.   Body mass index is 23.3 kg/m.  ROS  Review of Systems  Constitutional: Positive for activity change.  Musculoskeletal: Positive for arthralgias and back pain.  All other systems reviewed and are negative.   All other systems reviewed and are negative.  The following is a summary of the past history medically, past history surgically, known current medicines, social history and family history.  This information is gathered electronically by the computer from prior information and documentation.  I review this each visit and have found including this information at this point in the chart is beneficial and informative.    Past Medical History:  Diagnosis Date  . Bladder infection, chronic   . Cancer ([place]) 02/2018   right breast cancer  . Complication of anesthesia   . GERD (gastroesophageal reflux disease)   . Hemorrhoids   . Hypercholesterolemia   . Polymyalgia rheumatica ([place])   . PONV (postoperative nausea and vomiting)   . S/P colonoscopy Jun 14, 2004   friable internal and external hemorrhoids, left-sided diverticula  . S/P endoscopy August 13, 2006   pale 1-3 mm nodules consistent with benign squamous papilloma, tiny hiatal hernia  . Serrated adenoma  of colon     Past Surgical History:  Procedure Laterality Date  . ABDOMINAL HYSTERECTOMY    . BREAST LUMPECTOMY WITH RADIOACTIVE SEED LOCALIZATION Right 03/12/2018   Procedure: RIGHT BREAST LUMPECTOMY WITH RADIOACTIVE SEED LOCALIZATION;  Surgeon: Fanny Skates, MD;  Location: [facility];  Service: General;  Laterality: Right;  . CHOLECYSTECTOMY    . COLONOSCOPY  11/20/2010   Dr. Gala Romney- serrated adenomaL side diverticulosis, hemorrhoids  . COLONOSCOPY  06/14/04   friable internal and external hemorrhoids, left-sided diverticula  . COLONOSCOPY N/A 08/23/2016   Dr. Gala Romney: Diverticulosis, medium-sized grade 2 internal hemorrhoids.  . ESOPHAGOGASTRODUODENOSCOPY  08/13/06   pale 1-3 mm nodules consistent with benign squamous papilloma, tiny hiatal hernia  . ESOPHAGOGASTRODUODENOSCOPY N/A 08/23/2016   Dr. Gala Romney: Normal  . FLEXIBLE SIGMOIDOSCOPY  11/15/2011   Procedure: FLEXIBLE SIGMOIDOSCOPY;  Surgeon: Daneil Dolin, MD;  [facility];  Service: Endoscopy;  Laterality: N/A;  12:30PM  . WRIST FRACTURE SURGERY Right     Family History  Problem Relation Age of Onset  . Thyroid cancer Mother   . Parkinson's disease Sister   . Thyroid disease Brother   . Aneurysm Sister   . Thyroid cancer Other   . Thyroid cancer Other   . Colon cancer Neg Hx     Social History Social History   Tobacco Use  . Smoking status: Never Smoker  . Smokeless tobacco: Never Used  Substance Use Topics  . Alcohol use: Yes    Alcohol/week: 2.0 standard drinks    Types: 2  Glasses of wine per week    Comment: Drinks wine 2-3 days per week ( usually a couple of glasses each time)  . Drug use: No    Allergies  Allergen Reactions  . Fish Allergy Nausea And Vomiting  . Shrimp [Shellfish Allergy] Nausea And Vomiting    Current Outpatient Medications  Medication Sig Dispense Refill  . Calcium Carb-Cholecalciferol (CALCIUM 600 + D PO) Take 1 tablet by mouth daily.     Marland Kitchen ibuprofen (ADVIL)  200 MG tablet Take 200 mg by mouth every 6 (six) hours as needed.    . Multiple Vitamin (MULTIVITAMIN WITH MINERALS) TABS tablet Take 1 tablet by mouth daily.    . nitrofurantoin, macrocrystal-monohydrate, (MACROBID) 100 MG capsule     . polyethylene glycol (MIRALAX / GLYCOLAX) packet Take 17 g by mouth daily as needed.    . tamoxifen (NOLVADEX) 20 MG tablet Take 1 tablet (20 mg total) by mouth daily. 90 tablet 6  . Wheat Dextrin (BENEFIBER) POWD Take by mouth 2 (two) times daily. Takes 1 tbsp twice a day     . aspirin EC 81 MG tablet Take 81 mg by mouth daily.     . cephALEXin (KEFLEX) 250 MG capsule Take 250 mg by mouth daily.    . meloxicam (MOBIC) 7.5 MG tablet TAKE (1) TABLET BY MOUTHTONCE DAILY FOR 1 OR 2 WEEKS, THEN AS NEEDED.    . naproxen sodium (ALEVE) 220 MG tablet Take 220 mg by mouth daily as needed (pain).    . predniSONE (STERAPRED UNI-PAK 21 TAB) 5 MG (21) TBPK tablet Take 6 pills first day; 5 pills second day; 4 pills third day; 3 pills fourth day; 2 pills next day and 1 pill last day. (Patient not taking: Reported on 10/13/2019) 21 tablet 0   No current facility-administered medications for this visit.     Physical Exam  Blood pressure 127/71, pulse 79, height 5' 7.5" (1.715 m), weight 151 lb (68.5 kg).  Constitutional: overall normal hygiene, normal nutrition, well developed, normal grooming, normal body habitus. Assistive device:none  Musculoskeletal: gait and station Limp none, muscle tone and strength are normal, no tremors or atrophy is present.  .  Neurological: coordination overall normal.  Deep tendon reflex/nerve stretch intact.  Sensation normal.  Cranial nerves II-XII intact.   Skin:   Normal overall no scars, lesions, ulcers or rashes. No psoriasis.  Psychiatric: Alert and oriented x 3.  Recent memory intact, remote memory unclear.  Normal mood and affect. Well groomed.  Good eye contact.  Cardiovascular: overall no swelling, no varicosities, no edema  bilaterally, normal temperatures of the legs and arms, no clubbing, cyanosis and good capillary refill.  Lymphatic: palpation is normal.  Spine/Pelvis examination:  Inspection:  Overall, sacoiliac joint benign and hips nontender; without crepitus or defects.   Thoracic spine inspection: Alignment normal without kyphosis present   Lumbar spine inspection:  Alignment  with normal lumbar lordosis, without scoliosis apparent.   Thoracic spine palpation:  without tenderness of spinal processes   Lumbar spine palpation: without tenderness of lumbar area; without tightness of lumbar muscles    Range of Motion:   Lumbar flexion, forward flexion is normal without pain or tenderness    Lumbar extension is full without pain or tenderness   Left lateral bend is normal without pain or tenderness   Right lateral bend is normal without pain or tenderness   Straight leg raising is normal  Strength & tone: normal   Stability overall normal  stability  All other systems reviewed and are negative   The patient has been educated about the nature of the problem(s) and counseled on treatment options.  The patient appeared to understand what I have discussed and is in agreement with it.  Encounter Diagnoses  Name Primary?  . Lumbar pain with radiation down left leg Yes  . Age-related osteoporosis without current pathological fracture     PLAN Call if any problems.  Precautions discussed.  Continue current medications.   Return to clinic 6 weeks   Get epidurals scheduled.  Electronically Signed Sanjuana Kava, MD 6/8/202112:15 PM

## 2019-11-02 ENCOUNTER — Other Ambulatory Visit (HOSPITAL_COMMUNITY): Payer: Self-pay | Admitting: Hematology

## 2019-11-02 DIAGNOSIS — D0511 Intraductal carcinoma in situ of right breast: Secondary | ICD-10-CM

## 2019-11-23 ENCOUNTER — Other Ambulatory Visit: Payer: Self-pay

## 2019-11-23 ENCOUNTER — Encounter: Payer: Self-pay | Admitting: Physical Medicine and Rehabilitation

## 2019-11-23 ENCOUNTER — Ambulatory Visit: Payer: PPO | Admitting: Physical Medicine and Rehabilitation

## 2019-11-23 ENCOUNTER — Ambulatory Visit: Payer: Self-pay

## 2019-11-23 VITALS — BP 139/73 | HR 68

## 2019-11-23 DIAGNOSIS — M5416 Radiculopathy, lumbar region: Secondary | ICD-10-CM

## 2019-11-23 DIAGNOSIS — M4316 Spondylolisthesis, lumbar region: Secondary | ICD-10-CM

## 2019-11-23 DIAGNOSIS — M48062 Spinal stenosis, lumbar region with neurogenic claudication: Secondary | ICD-10-CM | POA: Diagnosis not present

## 2019-11-23 MED ORDER — METHYLPREDNISOLONE ACETATE 80 MG/ML IJ SUSP
80.0000 mg | Freq: Once | INTRAMUSCULAR | Status: AC
  Administered 2019-11-23: 80 mg

## 2019-11-23 NOTE — Progress Notes (Signed)
Pt states lower back pain. Pt states yard work and climbing up stairs makes the pain worse. Pt states pain medicine makes the pain better.   Numeric Pain Rating Scale and Functional Assessment Average Pain 0   In the last MONTH (on 0-10 scale) has pain interfered with the following?  1. General activity like being  able to carry out your everyday physical activities such as walking, climbing stairs, carrying groceries, or moving a chair?  Rating(9)   +Driver, -BT, -Dye Allergies.

## 2019-11-24 ENCOUNTER — Encounter: Payer: Self-pay | Admitting: Orthopaedic Surgery

## 2019-11-24 ENCOUNTER — Ambulatory Visit: Payer: PPO | Admitting: Orthopaedic Surgery

## 2019-11-24 VITALS — BP 131/62 | HR 68 | Ht 67.5 in | Wt 148.0 lb

## 2019-11-24 DIAGNOSIS — M545 Low back pain: Secondary | ICD-10-CM | POA: Diagnosis not present

## 2019-11-24 DIAGNOSIS — M79605 Pain in left leg: Secondary | ICD-10-CM | POA: Diagnosis not present

## 2019-11-24 NOTE — Progress Notes (Signed)
Patient [name], female DOB:1934-06-08, 85 y.o. [id number]  Chief Complaint  Patient presents with  . Back Pain    HPI  [name] is a 85 y.o. female who has lower back pain. She had the epidural done and it helped.  She goes back August 9th for the second round.  She has good and bad days.  Overall she is improved.  She has no weakness.   Body mass index is 22.84 kg/m.  ROS  Review of Systems  Constitutional: Positive for activity change.  Musculoskeletal: Positive for arthralgias and back pain.  All other systems reviewed and are negative.   All other systems reviewed and are negative.  The following is a summary of the past history medically, past history surgically, known current medicines, social history and family history.  This information is gathered electronically by the computer from prior information and documentation.  I review this each visit and have found including this information at this point in the chart is beneficial and informative.    Past Medical History:  Diagnosis Date  . Bladder infection, chronic   . Cancer ([place]) 02/2018   right breast cancer  . Complication of anesthesia   . GERD (gastroesophageal reflux disease)   . Hemorrhoids   . Hypercholesterolemia   . Polymyalgia rheumatica ([place])   . PONV (postoperative nausea and vomiting)   . S/P colonoscopy Jun 14, 2004   friable internal and external hemorrhoids, left-sided diverticula  . S/P endoscopy August 13, 2006   pale 1-3 mm nodules consistent with benign squamous papilloma, tiny hiatal hernia  . Serrated adenoma of colon     Past Surgical History:  Procedure Laterality Date  . ABDOMINAL HYSTERECTOMY    . BREAST LUMPECTOMY WITH RADIOACTIVE SEED LOCALIZATION Right 03/12/2018   Procedure: RIGHT BREAST LUMPECTOMY WITH RADIOACTIVE SEED LOCALIZATION;  Surgeon: Fanny Skates, MD;  Location: [facility];  Service: General;  Laterality: Right;  . CHOLECYSTECTOMY    .  COLONOSCOPY  11/20/2010   Dr. Gala Romney- serrated adenomaL side diverticulosis, hemorrhoids  . COLONOSCOPY  06/14/04   friable internal and external hemorrhoids, left-sided diverticula  . COLONOSCOPY N/A 08/23/2016   Dr. Gala Romney: Diverticulosis, medium-sized grade 2 internal hemorrhoids.  . ESOPHAGOGASTRODUODENOSCOPY  08/13/06   pale 1-3 mm nodules consistent with benign squamous papilloma, tiny hiatal hernia  . ESOPHAGOGASTRODUODENOSCOPY N/A 08/23/2016   Dr. Gala Romney: Normal  . FLEXIBLE SIGMOIDOSCOPY  11/15/2011   Procedure: FLEXIBLE SIGMOIDOSCOPY;  Surgeon: Daneil Dolin, MD;  [facility];  Service: Endoscopy;  Laterality: N/A;  12:30PM  . WRIST FRACTURE SURGERY Right     Family History  Problem Relation Age of Onset  . Thyroid cancer Mother   . Parkinson's disease Sister   . Thyroid disease Brother   . Aneurysm Sister   . Thyroid cancer Other   . Thyroid cancer Other   . Colon cancer Neg Hx     Social History Social History   Tobacco Use  . Smoking status: Never Smoker  . Smokeless tobacco: Never Used  Vaping Use  . Vaping Use: Never used  Substance Use Topics  . Alcohol use: Yes    Alcohol/week: 2.0 standard drinks    Types: 2 Glasses of wine per week    Comment: Drinks wine 2-3 days per week ( usually a couple of glasses each time)  . Drug use: No    Allergies  Allergen Reactions  . Fish Allergy Nausea And Vomiting  . Shrimp [employer]  Allergy] Nausea And Vomiting    Current Outpatient Medications  Medication Sig Dispense Refill  . aspirin EC 81 MG tablet Take 81 mg by mouth daily.     . Calcium Carb-Cholecalciferol (CALCIUM 600 + D PO) Take 1 tablet by mouth daily.     . cephALEXin (KEFLEX) 250 MG capsule Take 250 mg by mouth daily.    . meloxicam (MOBIC) 7.5 MG tablet TAKE (1) TABLET BY MOUTHTONCE DAILY FOR 1 OR 2 WEEKS, THEN AS NEEDED.    . Multiple Vitamin (MULTIVITAMIN WITH MINERALS) TABS tablet Take 1 tablet by mouth daily.    . polyethylene glycol  (MIRALAX / GLYCOLAX) packet Take 17 g by mouth daily as needed.    . tamoxifen (NOLVADEX) 20 MG tablet TAKE 1 TABLET BY MOUTH DAILY. 90 tablet 0  . Wheat Dextrin (BENEFIBER) POWD Take by mouth 2 (two) times daily. Takes 1 tbsp twice a day      Current Facility-Administered Medications  Medication Dose Route Frequency Provider Last Rate Last Admin  . methylPREDNISolone acetate (DEPO-MEDROL) injection 80 mg  80 mg Other Once Magnus Sinning, MD         Physical Exam  Blood pressure 131/62, pulse 68, height 5' 7.5" (1.715 m), weight 148 lb (67.1 kg).  Constitutional: overall normal hygiene, normal nutrition, well developed, normal grooming, normal body habitus. Assistive device:none  Musculoskeletal: gait and station Limp none, muscle tone and strength are normal, no tremors or atrophy is present.  .  Neurological: coordination overall normal.  Deep tendon reflex/nerve stretch intact.  Sensation normal.  Cranial nerves II-XII intact.   Skin:   Normal overall no scars, lesions, ulcers or rashes. No psoriasis.  Psychiatric: Alert and oriented x 3.  Recent memory intact, remote memory unclear.  Normal mood and affect. Well groomed.  Good eye contact.  Cardiovascular: overall no swelling, no varicosities, no edema bilaterally, normal temperatures of the legs and arms, no clubbing, cyanosis and good capillary refill.  Lymphatic: palpation is normal.  Spine/Pelvis examination:  Inspection:  Overall, sacoiliac joint benign and hips nontender; without crepitus or defects.   Thoracic spine inspection: Alignment normal without kyphosis present   Lumbar spine inspection:  Alignment  with normal lumbar lordosis, without scoliosis apparent.   Thoracic spine palpation:  without tenderness of spinal processes   Lumbar spine palpation: without tenderness of lumbar area; without tightness of lumbar muscles    Range of Motion:   Lumbar flexion, forward flexion is normal without pain or  tenderness    Lumbar extension is full without pain or tenderness   Left lateral bend is normal without pain or tenderness   Right lateral bend is normal without pain or tenderness   Straight leg raising is normal  Strength & tone: normal   Stability overall normal stability  All other systems reviewed and are negative   The patient has been educated about the nature of the problem(s) and counseled on treatment options.  The patient appeared to understand what I have discussed and is in agreement with it.  Encounter Diagnosis  Name Primary?  . Lumbar pain with radiation down left leg Yes    PLAN Call if any problems.  Precautions discussed.  Continue current medications.   Return to clinic 3 months   Electronically Signed Sanjuana Kava, MD 7/20/20218:57 AM

## 2019-12-01 ENCOUNTER — Other Ambulatory Visit: Payer: Self-pay

## 2019-12-14 ENCOUNTER — Ambulatory Visit: Payer: PPO | Admitting: Physical Medicine and Rehabilitation

## 2019-12-14 ENCOUNTER — Encounter: Payer: Self-pay | Admitting: Physical Medicine and Rehabilitation

## 2019-12-14 ENCOUNTER — Ambulatory Visit: Payer: Self-pay

## 2019-12-14 ENCOUNTER — Other Ambulatory Visit: Payer: Self-pay

## 2019-12-14 VITALS — BP 137/70 | HR 78

## 2019-12-14 DIAGNOSIS — M48062 Spinal stenosis, lumbar region with neurogenic claudication: Secondary | ICD-10-CM

## 2019-12-14 DIAGNOSIS — M4316 Spondylolisthesis, lumbar region: Secondary | ICD-10-CM

## 2019-12-14 DIAGNOSIS — M5416 Radiculopathy, lumbar region: Secondary | ICD-10-CM

## 2019-12-14 MED ORDER — METHYLPREDNISOLONE ACETATE 80 MG/ML IJ SUSP
80.0000 mg | Freq: Once | INTRAMUSCULAR | Status: AC
Start: 2019-12-14 — End: 2019-12-14
  Administered 2019-12-14: 80 mg

## 2019-12-14 NOTE — Progress Notes (Addendum)
[name] - 85 y.o. female MRN [id number]  Date of birth: 03-02-1935  Office Visit Note: Visit Date: 11/23/2019 PCP: Celene Squibb, MD Referred by: Celene Squibb, MD  Subjective: Chief Complaint  Patient presents with  . Lower Back - Pain   HPI: [name] is a 85 y.o. female who comes in today at the request of Dr. Sanjuana Kava for planned Left L4-L5 Lumbar epidural steroid injection with fluoroscopic guidance.  The patient has failed conservative care including home exercise, medications, time and activity modification.  This injection will be diagnostic and hopefully therapeutic.  Please see requesting physician notes for further details and justification.    A "series of 3" injections is not recommended and is outdated in the age of fluoroscopically guided injections as well as advanced imaging of the spine.  The injections are used both diagnostically and therapeutically and usually 1 to 2 injections will effectively treat the patient.  Occasionally there is another pain source identified and we may even do more than 3 injections depending on the source of pain.  A lot of insurance companies will not allow repeat injections without evaluation.    ROS Otherwise per HPI.  Assessment & Plan: Visit Diagnoses:  1. Spinal stenosis of lumbar region with neurogenic claudication   2. Spondylolisthesis of lumbar region   3. Lumbar radiculopathy     Plan: No additional findings.   Meds & Orders:  Meds ordered this encounter  Medications  . methylPREDNISolone acetate (DEPO-MEDROL) injection 80 mg    Orders Placed This Encounter  Procedures  . XR C-ARM NO REPORT  . Epidural Steroid injection    Follow-up: Return in about 2 weeks (around 12/07/2019) for repeat epidural.   Procedures: No procedures performed  Lumbosacral Transforaminal Epidural Steroid Injection - Sub-Pedicular Approach with Fluoroscopic Guidance  Patient: [name]      Date of Birth: 1934-12-02 MRN:  [id number] PCP: Celene Squibb, MD      Visit Date: 11/23/2019   Universal Protocol:    Date/Time: 11/23/2019  Consent Given By: the patient  Position: PRONE  Additional Comments: Vital signs were monitored before and after the procedure. Patient was prepped and draped in the usual sterile fashion. The correct patient, procedure, and site was verified.   Injection Procedure Details:  Procedure Site One Meds Administered:  Meds ordered this encounter  Medications  . methylPREDNISolone acetate (DEPO-MEDROL) injection 80 mg    Laterality: Left  Location/Site:  L4-L5  Needle size: 22 G  Needle type: Spinal  Needle Placement: Transforaminal  Findings:    -Comments: Excellent flow of contrast along the nerve, nerve root and into the epidural space.  Procedure Details: After squaring off the end-plates to get a true AP view, the C-arm was positioned so that an oblique view of the foramen as noted above was visualized. The target area is just inferior to the "nose of the scotty dog" or sub pedicular. The soft tissues overlying this structure were infiltrated with 2-3 ml. of 1% Lidocaine without Epinephrine.  The spinal needle was inserted toward the target using a "trajectory" view along the fluoroscope beam.  Under AP and lateral visualization, the needle was advanced so it did not puncture dura and was located close the 6 O'Clock position of the pedical in AP tracterory. Biplanar projections were used to confirm position. Aspiration was confirmed to be negative for CSF and/or blood. A 1-2 ml. volume of Isovue-250 was injected and flow of contrast  was noted at each level. Radiographs were obtained for documentation purposes.   After attaining the desired flow of contrast documented above, a 0.5 to 1.0 ml test dose of 0.25% Marcaine was injected into each respective transforaminal space.  The patient was observed for 90 seconds post injection.  After no sensory deficits were  reported, and normal lower extremity motor function was noted,   the above injectate was administered so that equal amounts of the injectate were placed at each foramen (level) into the transforaminal epidural space.   Additional Comments:  The patient tolerated the procedure well Dressing: 2 x 2 sterile gauze and Band-Aid    Post-procedure details: Patient was observed during the procedure. Post-procedure instructions were reviewed.  Patient left the clinic in stable condition.      Clinical History: MRI LUMBAR SPINE WITHOUT CONTRAST  TECHNIQUE: Multiplanar, multisequence MR imaging of the lumbar spine was performed. No intravenous contrast was administered.  COMPARISON:  None.  FINDINGS: Segmentation:  Standard.  Alignment: There is a grade 1 anterolisthesis of L4 over L5 related to facet arthrosis.  Vertebrae: No fracture, evidence of discitis, or bone lesion. Endplate degenerative changes at L5-S1.  Conus medullaris and cauda equina: Conus extends to the T12-L1 level. Conus and cauda equina appear normal.  Paraspinal and other soft tissues: Negative.  Disc levels:  T12-L1: No spinal canal or neural foraminal stenosis.  L1-2: No spinal canal or neural foraminal stenosis.  L2-3: Shallow disc bulge, facet degenerative changes and ligamentum flavum redundancy resulting in mild spinal canal stenosis. No significant neural foraminal narrowing.  L3-4: Disc bulge, prominent facet degenerative changes ligamentum flavum redundancy resulting in mild spinal canal stenosis. No significant neural foraminal narrowing.  L4-5: Disc bulge/disc uncovering, prominent facet degenerative changes and ligamentum flavum redundancy resulting in severe spinal canal stenosis and mild to moderate bilateral neural foraminal narrowing.  L5-S1: Minimal disc bulge with annular tear in the right subarticular zone, moderate facet degenerative changes with associated joint  effusion and posteriorly projecting small synovial cysts.  IMPRESSION: 1. Grade 1 anterolisthesis of L4 over L5 related to facet degeneration which in association with ligamentum flavum redundancy and disc bulge/disc uncovering results in moderate to severe spinal canal stenosis at this level. 2. Mild degenerative changes of the remainder of the lumbar spine, more significant at the level of the facet joints.   Electronically Signed   By: Pedro Earls M.D.   On: 10/09/2019 15:47   She reports that she has never smoked. She has never used smokeless tobacco. No results for input(s): HGBA1C, LABURIC in the last 8760 hours.  Objective:  VS:  HT:    WT:   BMI:     BP:139/73  HR:68bpm  TEMP: ( )  RESP:  Physical Exam Constitutional:      General: She is not in acute distress.    Appearance: Normal appearance. She is not ill-appearing.  HENT:     Head: Normocephalic and atraumatic.     Right Ear: External ear normal.     Left Ear: External ear normal.  Eyes:     Extraocular Movements: Extraocular movements intact.  Cardiovascular:     Rate and Rhythm: Normal rate.     Pulses: Normal pulses.  Musculoskeletal:     Right lower leg: No edema.     Left lower leg: No edema.     Comments: Patient has good distal strength with no pain over the greater trochanters.  No clonus or focal weakness.  Skin:    Findings: No erythema, lesion or rash.  Neurological:     General: No focal deficit present.     Mental Status: She is alert and oriented to person, place, and time.     Sensory: No sensory deficit.     Motor: No weakness or abnormal muscle tone.     Coordination: Coordination normal.  Psychiatric:        Mood and Affect: Mood normal.        Behavior: Behavior normal.     Ortho Exam  Imaging: No results found.  Past Medical/Family/Surgical/Social History: Medications & Allergies reviewed per EMR, new medications updated. Patient Active Problem List    Diagnosis Date Noted  . Breast neoplasm, Tis (DCIS), 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  . Hemorrhoids 11/22/2016  . Hx of adenomatous colonic polyps 07/20/2016  . Rectal bleeding 07/20/2016  . Constipation 07/20/2016  . GERD (gastroesophageal reflux disease) 07/20/2016  . Heme + stool 10/30/2010   Past Medical History:  Diagnosis Date  . Bladder infection, chronic   . Cancer ([place]) 02/2018   right breast cancer  . Complication of anesthesia   . GERD (gastroesophageal reflux disease)   . Hemorrhoids   . Hypercholesterolemia   . Polymyalgia rheumatica ([place])   . PONV (postoperative nausea and vomiting)   . S/P colonoscopy Jun 14, 2004   friable internal and external hemorrhoids, left-sided diverticula  . S/P endoscopy August 13, 2006   pale 1-3 mm nodules consistent with benign squamous papilloma, tiny hiatal hernia  . Serrated adenoma of colon    Family History  Problem Relation Age of Onset  . Thyroid cancer Mother   . Parkinson's disease Sister   . Thyroid disease Brother   . Aneurysm Sister   . Thyroid cancer Other   . Thyroid cancer Other   . Colon cancer Neg Hx    Past Surgical History:  Procedure Laterality Date  . ABDOMINAL HYSTERECTOMY    . BREAST LUMPECTOMY WITH RADIOACTIVE SEED LOCALIZATION Right 03/12/2018   Procedure: RIGHT BREAST LUMPECTOMY WITH RADIOACTIVE SEED LOCALIZATION;  Surgeon: Fanny Skates, MD;  Location: [facility];  Service: General;  Laterality: Right;  . CHOLECYSTECTOMY    . COLONOSCOPY  11/20/2010   Dr. Gala Romney- serrated adenomaL side diverticulosis, hemorrhoids  . COLONOSCOPY  06/14/04   friable internal and external hemorrhoids, left-sided diverticula  . COLONOSCOPY N/A 08/23/2016   Dr. Gala Romney: Diverticulosis, medium-sized grade 2 internal hemorrhoids.  . ESOPHAGOGASTRODUODENOSCOPY  08/13/06   pale 1-3 mm nodules consistent with benign squamous papilloma, tiny hiatal hernia  . ESOPHAGOGASTRODUODENOSCOPY N/A 08/23/2016   Dr. Gala Romney: Normal  . FLEXIBLE  SIGMOIDOSCOPY  11/15/2011   Procedure: FLEXIBLE SIGMOIDOSCOPY;  Surgeon: Daneil Dolin, MD;  [facility];  Service: Endoscopy;  Laterality: N/A;  12:30PM  . WRIST FRACTURE SURGERY Right    Social History   Occupational History  . Not on file  Tobacco Use  . Smoking status: Never Smoker  . Smokeless tobacco: Never Used  Vaping Use  . Vaping Use: Never used  Substance and Sexual Activity  . Alcohol use: Yes    Alcohol/week: 2.0 standard drinks    Types: 2 Glasses of wine per week    Comment: Drinks wine 2-3 days per week ( usually a couple of glasses each time)  . Drug use: No  . Sexual activity: Not on file

## 2019-12-14 NOTE — Procedures (Signed)
Lumbosacral Transforaminal Epidural Steroid Injection - Sub-Pedicular Approach with Fluoroscopic Guidance  Patient: [name]      Date of Birth: [birth year] MRN: [id number] PCP: Celene Squibb, MD      Visit Date: 11/23/2019   Universal Protocol:    Date/Time: 11/23/2019  Consent Given By: the patient  Position: PRONE  Additional Comments: Vital signs were monitored before and after the procedure. Patient was prepped and draped in the usual sterile fashion. The correct patient, procedure, and site was verified.   Injection Procedure Details:  Procedure Site One Meds Administered:  Meds ordered this encounter  Medications  . methylPREDNISolone acetate (DEPO-MEDROL) injection 80 mg    Laterality: Left  Location/Site:  L4-L5  Needle size: 22 G  Needle type: Spinal  Needle Placement: Transforaminal  Findings:    -Comments: Excellent flow of contrast along the nerve, nerve root and into the epidural space.  Procedure Details: After squaring off the end-plates to get a true AP view, the C-arm was positioned so that an oblique view of the foramen as noted above was visualized. The target area is just inferior to the "nose of the scotty dog" or sub pedicular. The soft tissues overlying this structure were infiltrated with 2-3 ml. of 1% Lidocaine without Epinephrine.  The spinal needle was inserted toward the target using a "trajectory" view along the fluoroscope beam.  Under AP and lateral visualization, the needle was advanced so it did not puncture dura and was located close the 6 O'Clock position of the pedical in AP tracterory. Biplanar projections were used to confirm position. Aspiration was confirmed to be negative for CSF and/or blood. A 1-2 ml. volume of Isovue-250 was injected and flow of contrast was noted at each level. Radiographs were obtained for documentation purposes.   After attaining the desired flow of contrast documented above, a 0.5 to 1.0 ml test dose  of 0.25% Marcaine was injected into each respective transforaminal space.  The patient was observed for 90 seconds post injection.  After no sensory deficits were reported, and normal lower extremity motor function was noted,   the above injectate was administered so that equal amounts of the injectate were placed at each foramen (level) into the transforaminal epidural space.   Additional Comments:  The patient tolerated the procedure well Dressing: 2 x 2 sterile gauze and Band-Aid    Post-procedure details: Patient was observed during the procedure. Post-procedure instructions were reviewed.  Patient left the clinic in stable condition.

## 2019-12-14 NOTE — Progress Notes (Signed)
Pt states lower lower back pain that travels to her left. Pt states climbing stairs has made the pain worse. Pt state laying down or sitting helps with pain. Pt has hx of inj on 11/23/19 pt states the pain is feels better  Numeric Pain Rating Scale and Functional Assessment Average Pain 6   In the last MONTH (on 0-10 scale) has pain interfered with the following?  1. General activity like being  able to carry out your everyday physical activities such as walking, climbing stairs, carrying groceries, or moving a chair?  Rating(10)   +Driver, -BT, -Dye Allergies.

## 2019-12-15 NOTE — Progress Notes (Signed)
CLARETHA Dougherty - 85 y.o. female MRN [id number]  Date of birth: October 15, 1934  Office Visit Note: Visit Date: 12/14/2019 PCP: Celene Squibb, MD Referred by: Celene Squibb, MD  Subjective: Chief Complaint  Patient presents with  . Lower Back - Pain  . Left Leg - Pain   HPI: [name] is a 85 y.o. female who comes in today for planned repeat Bilateral L4-L5 Lumbar epidural steroid injection with fluoroscopic guidance.  The patient has failed conservative care including home exercise, medications, time and activity modification.  This injection will be diagnostic and hopefully therapeutic.  Please see requesting physician notes for further details and justification. Patient received more than 50% pain relief from prior injection.   Referring: Dr. Sanjuana Kava  Patient did extremely well with prior bilateral L4 transforaminal injection a few weeks ago.  Unfortunately, the dictated report shows left-sided injection but we did complete bilateral injection.  We had considerable debate with each other about whether to do both sides are 1 side and we ended up doing both sides.  She reports significant relief of her leg pain but with still continued back pain referred into the buttocks with a feeling of weakness in the legs.  Patient has severe stenosis at L4-5.  She is asking me today about back exercises and activity modification which we went over at length today.  We are going to make a referral for any pain and physical therapy to give her a good course in core strengthening and posture and mobility training that I think will help her in the long run.  Unfortunately there is no specific exercises for stenosis to open the canal.  Patient healthy enough to consider decompression at that level but thus not something she is really wanting to do.  It still may be a viable treatment plan for her depending on how well she does with injections.  Would consider facet joint blocks diagnostically for continued back  pain and multilevel facet arthritis.  We could look at radiofrequency ablation as part of a more comprehensive management program.  A "series of 3" injections is not recommended and is outdated in the age of fluoroscopically guided injections as well as advanced imaging of the spine.  The injections are used both diagnostically and therapeutically and usually 1 to 2 injections will effectively treat the patient.  Occasionally there is another pain source identified and we may even do more than 3 injections depending on the source of pain.  A lot of insurance companies will not allow repeat injections without evaluation.    ROS Otherwise per HPI.  Assessment & Plan: Visit Diagnoses:  1. Spinal stenosis of lumbar region with neurogenic claudication   2. Lumbar radiculopathy   3. Spondylolisthesis of lumbar region     Plan: No additional findings.   Meds & Orders:  Meds ordered this encounter  Medications  . methylPREDNISolone acetate (DEPO-MEDROL) injection 80 mg    Orders Placed This Encounter  Procedures  . XR C-ARM NO REPORT  . Ambulatory referral to Physical Therapy  . Epidural Steroid injection    Follow-up: Return for visit to requesting physician as needed.   Procedures: No procedures performed  Lumbosacral Transforaminal Epidural Steroid Injection - Sub-Pedicular Approach with Fluoroscopic Guidance  Patient: [name]      Date of Birth: 01-Jun-1934 MRN: [id number] PCP: Celene Squibb, MD      Visit Date: 12/14/2019   Universal Protocol:    Date/Time: 12/14/2019  Consent Given By: the patient  Position: PRONE  Additional Comments: Vital signs were monitored before and after the procedure. Patient was prepped and draped in the usual sterile fashion. The correct patient, procedure, and site was verified.   Injection Procedure Details:  Procedure Site One Meds Administered:  Meds ordered this encounter  Medications  . methylPREDNISolone acetate (DEPO-MEDROL)  injection 80 mg    Laterality: Bilateral  Location/Site:  L4-L5  Needle size: 22 G  Needle type: Spinal  Needle Placement: Transforaminal  Findings:    -Comments: Excellent flow of contrast along the nerve, nerve root and into the epidural space.  Procedure Details: After squaring off the end-plates to get a true AP view, the C-arm was positioned so that an oblique view of the foramen as noted above was visualized. The target area is just inferior to the "nose of the scotty dog" or sub pedicular. The soft tissues overlying this structure were infiltrated with 2-3 ml. of 1% Lidocaine without Epinephrine.  The spinal needle was inserted toward the target using a "trajectory" view along the fluoroscope beam.  Under AP and lateral visualization, the needle was advanced so it did not puncture dura and was located close the 6 O'Clock position of the pedical in AP tracterory. Biplanar projections were used to confirm position. Aspiration was confirmed to be negative for CSF and/or blood. A 1-2 ml. volume of Isovue-250 was injected and flow of contrast was noted at each level. Radiographs were obtained for documentation purposes.   After attaining the desired flow of contrast documented above, a 0.5 to 1.0 ml test dose of 0.25% Marcaine was injected into each respective transforaminal space.  The patient was observed for 90 seconds post injection.  After no sensory deficits were reported, and normal lower extremity motor function was noted,   the above injectate was administered so that equal amounts of the injectate were placed at each foramen (level) into the transforaminal epidural space.   Additional Comments:  The patient tolerated the procedure well Dressing: 2 x 2 sterile gauze and Band-Aid    Post-procedure details: Patient was observed during the procedure. Post-procedure instructions were reviewed.  Patient left the clinic in stable condition.      Clinical History: MRI  LUMBAR SPINE WITHOUT CONTRAST  TECHNIQUE: Multiplanar, multisequence MR imaging of the lumbar spine was performed. No intravenous contrast was administered.  COMPARISON:  None.  FINDINGS: Segmentation:  Standard.  Alignment: There is a grade 1 anterolisthesis of L4 over L5 related to facet arthrosis.  Vertebrae: No fracture, evidence of discitis, or bone lesion. Endplate degenerative changes at L5-S1.  Conus medullaris and cauda equina: Conus extends to the T12-L1 level. Conus and cauda equina appear normal.  Paraspinal and other soft tissues: Negative.  Disc levels:  T12-L1: No spinal canal or neural foraminal stenosis.  L1-2: No spinal canal or neural foraminal stenosis.  L2-3: Shallow disc bulge, facet degenerative changes and ligamentum flavum redundancy resulting in mild spinal canal stenosis. No significant neural foraminal narrowing.  L3-4: Disc bulge, prominent facet degenerative changes ligamentum flavum redundancy resulting in mild spinal canal stenosis. No significant neural foraminal narrowing.  L4-5: Disc bulge/disc uncovering, prominent facet degenerative changes and ligamentum flavum redundancy resulting in severe spinal canal stenosis and mild to moderate bilateral neural foraminal narrowing.  L5-S1: Minimal disc bulge with annular tear in the right subarticular zone, moderate facet degenerative changes with associated joint effusion and posteriorly projecting small synovial cysts.  IMPRESSION: 1. Grade 1 anterolisthesis of L4  over L5 related to facet degeneration which in association with ligamentum flavum redundancy and disc bulge/disc uncovering results in moderate to severe spinal canal stenosis at this level. 2. Mild degenerative changes of the remainder of the lumbar spine, more significant at the level of the facet joints.   Electronically Signed   By: Pedro Earls M.D.   On: 10/09/2019 15:47   She  reports that she has never smoked. She has never used smokeless tobacco. No results for input(s): HGBA1C, LABURIC in the last 8760 hours.  Objective:  VS:  HT:    WT:   BMI:     BP:137/70  HR:78bpm  TEMP: ( )  RESP:  Physical Exam Constitutional:      General: She is not in acute distress.    Appearance: Normal appearance. She is not ill-appearing.  HENT:     Head: Normocephalic and atraumatic.     Right Ear: External ear normal.     Left Ear: External ear normal.  Eyes:     Extraocular Movements: Extraocular movements intact.  Cardiovascular:     Rate and Rhythm: Normal rate.     Pulses: Normal pulses.  Musculoskeletal:     Right lower leg: No edema.     Left lower leg: No edema.     Comments: Patient has good distal strength with no pain over the greater trochanters.  No clonus or focal weakness.  Skin:    Findings: No erythema, lesion or rash.  Neurological:     General: No focal deficit present.     Mental Status: She is alert and oriented to person, place, and time.     Sensory: No sensory deficit.     Motor: No weakness or abnormal muscle tone.     Coordination: Coordination normal.  Psychiatric:        Mood and Affect: Mood normal.        Behavior: Behavior normal.     Ortho Exam  Imaging: XR C-ARM NO REPORT  Result Date: 12/14/2019 Please see Notes tab for imaging impression.   Past Medical/Family/Surgical/Social History: Medications & Allergies reviewed per EMR, new medications updated. Patient Active Problem List   Diagnosis Date Noted  . Breast neoplasm, Tis (DCIS), 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  . Hemorrhoids 11/22/2016  . Hx of adenomatous colonic polyps 07/20/2016  . Rectal bleeding 07/20/2016  . Constipation 07/20/2016  . GERD (gastroesophageal reflux disease) 07/20/2016  . Heme + stool 10/30/2010   Past Medical History:  Diagnosis Date  . Bladder infection, chronic   . Cancer ([place]) 02/2018   right breast cancer  . Complication of anesthesia   . GERD  (gastroesophageal reflux disease)   . Hemorrhoids   . Hypercholesterolemia   . Polymyalgia rheumatica ([place])   . PONV (postoperative nausea and vomiting)   . S/P colonoscopy Jun 14, 2004   friable internal and external hemorrhoids, left-sided diverticula  . S/P endoscopy August 13, 2006   pale 1-3 mm nodules consistent with benign squamous papilloma, tiny hiatal hernia  . Serrated adenoma of colon    Family History  Problem Relation Age of Onset  . Thyroid cancer Mother   . Parkinson's disease Sister   . Thyroid disease Brother   . Aneurysm Sister   . Thyroid cancer Other   . Thyroid cancer Other   . Colon cancer Neg Hx    Past Surgical History:  Procedure Laterality Date  . ABDOMINAL HYSTERECTOMY    . BREAST LUMPECTOMY WITH RADIOACTIVE SEED LOCALIZATION  Right 03/12/2018   Procedure: RIGHT BREAST LUMPECTOMY WITH RADIOACTIVE SEED LOCALIZATION;  Surgeon: Fanny Skates, MD;  Location: [facility];  Service: General;  Laterality: Right;  . CHOLECYSTECTOMY    . COLONOSCOPY  11/20/2010   Dr. Gala Romney- serrated adenomaL side diverticulosis, hemorrhoids  . COLONOSCOPY  06/14/04   friable internal and external hemorrhoids, left-sided diverticula  . COLONOSCOPY N/A 08/23/2016   Dr. Gala Romney: Diverticulosis, medium-sized grade 2 internal hemorrhoids.  . ESOPHAGOGASTRODUODENOSCOPY  08/13/06   pale 1-3 mm nodules consistent with benign squamous papilloma, tiny hiatal hernia  . ESOPHAGOGASTRODUODENOSCOPY N/A 08/23/2016   Dr. Gala Romney: Normal  . FLEXIBLE SIGMOIDOSCOPY  11/15/2011   Procedure: FLEXIBLE SIGMOIDOSCOPY;  Surgeon: Daneil Dolin, MD;  [facility];  Service: Endoscopy;  Laterality: N/A;  12:30PM  . WRIST FRACTURE SURGERY Right    Social History   Occupational History  . Not on file  Tobacco Use  . Smoking status: Never Smoker  . Smokeless tobacco: Never Used  Vaping Use  . Vaping Use: Never used  Substance and Sexual Activity  . Alcohol use: Yes    Alcohol/week:  2.0 standard drinks    Types: 2 Glasses of wine per week    Comment: Drinks wine 2-3 days per week ( usually a couple of glasses each time)  . Drug use: No  . Sexual activity: Not on file

## 2019-12-15 NOTE — Procedures (Signed)
Lumbosacral Transforaminal Epidural Steroid Injection - Sub-Pedicular Approach with Fluoroscopic Guidance  Patient: [name]      Date of Birth: June 18, 1934 MRN: [id number] PCP: Celene Squibb, MD      Visit Date: 12/14/2019   Universal Protocol:    Date/Time: 12/14/2019  Consent Given By: the patient  Position: PRONE  Additional Comments: Vital signs were monitored before and after the procedure. Patient was prepped and draped in the usual sterile fashion. The correct patient, procedure, and site was verified.   Injection Procedure Details:  Procedure Site One Meds Administered:  Meds ordered this encounter  Medications  . methylPREDNISolone acetate (DEPO-MEDROL) injection 80 mg    Laterality: Bilateral  Location/Site:  L4-L5  Needle size: 22 G  Needle type: Spinal  Needle Placement: Transforaminal  Findings:    -Comments: Excellent flow of contrast along the nerve, nerve root and into the epidural space.  Procedure Details: After squaring off the end-plates to get a true AP view, the C-arm was positioned so that an oblique view of the foramen as noted above was visualized. The target area is just inferior to the "nose of the scotty dog" or sub pedicular. The soft tissues overlying this structure were infiltrated with 2-3 ml. of 1% Lidocaine without Epinephrine.  The spinal needle was inserted toward the target using a "trajectory" view along the fluoroscope beam.  Under AP and lateral visualization, the needle was advanced so it did not puncture dura and was located close the 6 O'Clock position of the pedical in AP tracterory. Biplanar projections were used to confirm position. Aspiration was confirmed to be negative for CSF and/or blood. A 1-2 ml. volume of Isovue-250 was injected and flow of contrast was noted at each level. Radiographs were obtained for documentation purposes.   After attaining the desired flow of contrast documented above, a 0.5 to 1.0 ml test  dose of 0.25% Marcaine was injected into each respective transforaminal space.  The patient was observed for 90 seconds post injection.  After no sensory deficits were reported, and normal lower extremity motor function was noted,   the above injectate was administered so that equal amounts of the injectate were placed at each foramen (level) into the transforaminal epidural space.   Additional Comments:  The patient tolerated the procedure well Dressing: 2 x 2 sterile gauze and Band-Aid    Post-procedure details: Patient was observed during the procedure. Post-procedure instructions were reviewed.  Patient left the clinic in stable condition.

## 2020-01-05 ENCOUNTER — Other Ambulatory Visit: Payer: Self-pay

## 2020-01-05 ENCOUNTER — Ambulatory Visit (HOSPITAL_COMMUNITY): Payer: PPO | Attending: Physical Medicine and Rehabilitation | Admitting: Physical Therapy

## 2020-01-05 ENCOUNTER — Encounter (HOSPITAL_COMMUNITY): Payer: Self-pay | Admitting: Physical Therapy

## 2020-01-05 DIAGNOSIS — M5416 Radiculopathy, lumbar region: Secondary | ICD-10-CM | POA: Diagnosis not present

## 2020-01-05 DIAGNOSIS — M6281 Muscle weakness (generalized): Secondary | ICD-10-CM

## 2020-01-05 NOTE — Patient Instructions (Signed)
Access Code: [email or web address] URL: [email or web address] Date: 01/05/2020 Prepared by: Josue Hector  Exercises Sit to Stand without Arm Support - 2 x daily - 7 x weekly - 2 sets - 10 reps Standing Heel Raise with Support - 2 x daily - 7 x weekly - 2 sets - 10 reps

## 2020-01-05 NOTE — Therapy (Signed)
[facility] [place], [place], [address] Phone: [phone]   Fax:  [phone]  Physical Therapy Evaluation  Patient Details  Name: [name] MRN: [id number] Date of Birth: 1934/07/22 Referring Provider (PT): Magnus Sinning MD    Encounter Date: 01/05/2020   PT End of Session - 01/05/20 0933    Visit Number 1    Number of Visits 4    Date for PT Re-Evaluation 02/05/20    Authorization Type [employer] (no auth, no VL)    PT Start Time 0900    PT Stop Time 0940    PT Time Calculation (min) 40 min    Activity Tolerance Patient tolerated treatment well    Behavior During Therapy [facility] for tasks assessed/performed           Past Medical History:  Diagnosis Date  . Bladder infection, chronic   . Cancer ([place]) 02/2018   right breast cancer  . Complication of anesthesia   . GERD (gastroesophageal reflux disease)   . Hemorrhoids   . Hypercholesterolemia   . Polymyalgia rheumatica ([place])   . PONV (postoperative nausea and vomiting)   . S/P colonoscopy Jun 14, 2004   friable internal and external hemorrhoids, left-sided diverticula  . S/P endoscopy August 13, 2006   pale 1-3 mm nodules consistent with benign squamous papilloma, tiny hiatal hernia  . Serrated adenoma of colon     Past Surgical History:  Procedure Laterality Date  . ABDOMINAL HYSTERECTOMY    . BREAST LUMPECTOMY WITH RADIOACTIVE SEED LOCALIZATION Right 03/12/2018   Procedure: RIGHT BREAST LUMPECTOMY WITH RADIOACTIVE SEED LOCALIZATION;  Surgeon: Fanny Skates, MD;  Location: [facility];  Service: General;  Laterality: Right;  . CHOLECYSTECTOMY    . COLONOSCOPY  11/20/2010   Dr. Gala Romney- serrated adenomaL side diverticulosis, hemorrhoids  . COLONOSCOPY  06/14/04   friable internal and external hemorrhoids, left-sided diverticula  . COLONOSCOPY N/A 08/23/2016   Dr. Gala Romney: Diverticulosis, medium-sized grade 2 internal hemorrhoids.  .  ESOPHAGOGASTRODUODENOSCOPY  08/13/06   pale 1-3 mm nodules consistent with benign squamous papilloma, tiny hiatal hernia  . ESOPHAGOGASTRODUODENOSCOPY N/A 08/23/2016   Dr. Gala Romney: Normal  . FLEXIBLE SIGMOIDOSCOPY  11/15/2011   Procedure: FLEXIBLE SIGMOIDOSCOPY;  Surgeon: Daneil Dolin, MD;  [facility];  Service: Endoscopy;  Laterality: N/A;  12:30PM  . WRIST FRACTURE SURGERY Right     There were no vitals filed for this visit.    Subjective Assessment - 01/05/20 0904    Subjective Patient presents to physical therapy with complaint of low back pain that runs down her legs. Patient says she feels like this has caused her to shift weight to her RT and is making her walk funny. Patient states this has been going on for about a year. She says she thought is was a problem with her hips, and has had several epidural injections but continues to have pain. Patient states she feels this is making her unbalanced and does not want it to progress to where she may fall.    Limitations Standing;Walking;House hold activities    Diagnostic tests MRI    Patient Stated Goals to be able to walk normal    Currently in Pain? Yes    Pain Score 4     Pain Location Back    Pain Orientation Posterior;Lateral    Pain Descriptors / Indicators Discomfort    Pain Type Chronic pain    Pain Onset More than  a month ago    Pain Frequency Constant    Aggravating Factors  walking farther distance    Pain Relieving Factors not much    Effect of Pain on Daily Activities Limits              [facility] PT Assessment - 01/05/20 0001      Assessment   Medical Diagnosis lumbar pain with LT radiculopathy     Referring Provider (PT) Magnus Sinning MD     Onset Date/Surgical Date --   1 year    Next MD Visit --   None scheduled    Prior Therapy No      Precautions   Precautions None      Restrictions   Weight Bearing Restrictions No      Balance Screen   Has the patient fallen in the past 6 months No     Has the patient had a decrease in activity level because of a fear of falling?  Yes    Is the patient reluctant to leave their home because of a fear of falling?  No      Home Ecologist residence    Living Arrangements Alone      Prior Function   Level of Independence Independent      Cognition   Overall Cognitive Status Within Functional Limits for tasks assessed      Observation/Other Assessments   Focus on Therapeutic Outcomes (FOTO)  51% limited       ROM / Strength   AROM / PROM / Strength AROM;Strength      AROM   Overall AROM Comments Lumbar AROM WFL     AROM Assessment Site Lumbar      Strength   Strength Assessment Site Hip    Right/Left Hip Right;Left    Right Hip Flexion 4+/5    Right Hip Extension 4/5    Right Hip ABduction 3+/5    Left Hip Flexion 4/5    Left Hip Extension 3+/5   with pain    Left Hip ABduction 4/5      Palpation   Palpation comment Mod TTP about sacral region       Transfers   Five time sit to stand comments  8.4 sec with no UE       Ambulation/Gait   Ambulation/Gait Yes    Ambulation/Gait Assistance 7: Independent    Ambulation Distance (Feet) 500 Feet    Assistive device None    Gait Pattern Decreased stance time - left;Decreased stride length;Poor foot clearance - left;Trendelenburg    Ambulation Surface Level;Indoor    Gait Comments 2MWT                       Objective measurements completed on examination: See above findings.               PT Education - 01/05/20 0905    Education Details on evaluation findings, POC and HEP    Person(s) Educated Patient    Methods Explanation;Handout    Comprehension Verbalized understanding            PT Short Term Goals - 01/05/20 0939      PT SHORT TERM GOAL #1   Title Patient will be independent with initial HEP and self-management strategies to improve functional outcomes    Time 2    Period Weeks    Status New    Target  Date 01/22/20  PT Long Term Goals - 01/05/20 0939      PT LONG TERM GOAL #1   Title Patient will improve FOTO score by 10% to indicate improvement in functional outcomes    Time 4    Period Weeks    Status New    Target Date 02/05/20      PT LONG TERM GOAL #2   Title Patient will have equal to or > 4/5 MMT throughout BLE to improve ability to perform functional mobility, stair ambulation and ADLs.    Time 4    Period Weeks    Status New    Target Date 02/05/20      PT LONG TERM GOAL #3   Title Patient will report at least 65% overall improvement in subjective complaint to indicate improvement in ability to perform ADLs.    Time 4    Period Weeks    Status New    Target Date 02/05/20                  Plan - 01/05/20 0936    Clinical Impression Statement Patient is a 85 y.o. female who presents to physical therapy with complaint of low back pain. Patient demonstrates decreased strength, increased tenderness to palpation and gait abnormalities which are likely contributing to symptoms of pain and are negatively impacting patient ability to perform ADLs and functional mobility tasks. Patient will benefit from skilled physical therapy services to address these deficits to reduce pain, improve level of function with ADLs, functional mobility tasks, and reduce risk for falls.    Personal Factors and Comorbidities Age    Examination-Activity Limitations Lift;Locomotion Level;Transfers    Examination-Participation Restrictions Laundry;Yard Work;Community Activity    Stability/Clinical Decision Making Stable/Uncomplicated    Clinical Decision Making Low    Rehab Potential Good    PT Frequency 1x / week    PT Duration 4 weeks    PT Treatment/Interventions ADLs/Self Care Home Management;Aquatic Therapy;Biofeedback;Cryotherapy;Electrical Stimulation;Iontophoresis 4mg /ml Dexamethasone;Moist Heat;Traction;Balance training;Manual techniques;Therapeutic  exercise;Therapeutic activities;Functional mobility training;Orthotic Fit/Training;Stair training;Gait training;Patient/family education;DME Instruction;Contrast Bath;Neuromuscular re-education;Ultrasound;Parrafin;Fluidtherapy;Compression bandaging;Scar mobilization;Passive range of motion;Spinal Manipulations;Joint Manipulations;Dry needling;Energy conservation;Splinting;Taping;Vasopneumatic Device    PT Next Visit Plan Review goals and HEP. Progress functional hip and core strengthening as tolerated with focus ok buliding weekly HEP.    PT Home Exercise Plan 01/05/20: sit to stand, heel raises    Consulted and Agree with Plan of Care Patient           Patient will benefit from skilled therapeutic intervention in order to improve the following deficits and impairments:  Abnormal gait, Pain, Decreased strength, Decreased activity tolerance, Increased fascial restricitons, Improper body mechanics, Difficulty walking  Visit Diagnosis: Radiculopathy, lumbar region  Muscle weakness (generalized)     Problem List Patient Active Problem List   Diagnosis Date Noted  . Breast neoplasm, Tis (DCIS), 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  . Hemorrhoids 11/22/2016  . Hx of adenomatous colonic polyps 07/20/2016  . Rectal bleeding 07/20/2016  . Constipation 07/20/2016  . GERD (gastroesophageal reflux disease) 07/20/2016  . Heme + stool 10/30/2010    9:43 AM, 01/05/20 Josue Hector PT DPT  Physical Therapist with [facility]  [phone] [place] [address] [place], [place], [address] Phone: [phone]   Fax:  [phone]  Name: [name] MRN: [id number] Date of Birth: 03-15-1935

## 2020-01-21 ENCOUNTER — Other Ambulatory Visit: Payer: Self-pay

## 2020-01-21 ENCOUNTER — Ambulatory Visit (HOSPITAL_COMMUNITY): Payer: PPO | Attending: Physical Medicine and Rehabilitation | Admitting: Physical Therapy

## 2020-01-21 ENCOUNTER — Encounter (HOSPITAL_COMMUNITY): Payer: Self-pay | Admitting: Physical Therapy

## 2020-01-21 DIAGNOSIS — M5416 Radiculopathy, lumbar region: Secondary | ICD-10-CM

## 2020-01-21 DIAGNOSIS — M6281 Muscle weakness (generalized): Secondary | ICD-10-CM

## 2020-01-21 NOTE — Patient Instructions (Signed)
Access Code: [email or web address] URL: [email or web address] Date: 01/21/2020 Prepared by: Josue Hector  Exercises Supine Transversus Abdominis Bracing - Hands on Stomach - 2 x daily - 7 x weekly - 2 sets - 10 reps - 5 sec hold Supine March - 2 x daily - 7 x weekly - 2 sets - 10 reps Supine Bridge - 2 x daily - 7 x weekly - 2 sets - 10 reps - 5 sec hold Supine Double Knee to Chest - 2 x daily - 7 x weekly - 2 sets - 5 reps - 10 sec hold Hooklying Isometric Hip Abduction with Belt - 2 x daily - 7 x weekly - 2 sets - 10 reps - 5 sec hold Supine Hip Adduction Isometric with Ball - 2 x daily - 7 x weekly - 2 sets - 10 reps - 5 sec hold

## 2020-01-21 NOTE — Therapy (Signed)
[place], [place], [address] Phone: [phone]   Fax:  [phone]  Physical Therapy Treatment  Patient Details  Name: [name] MRN: [id number] Date of Birth: 26-Jun-1934 Referring Provider (PT): Magnus Sinning MD    Encounter Date: 01/21/2020   PT End of Session - 01/21/20 1016    Visit Number 2    Number of Visits 4    Date for PT Re-Evaluation 02/05/20    Authorization Type [employer] (no auth, no VL)    PT Start Time 1010   error with check in time   PT Stop Time 1042    PT Time Calculation (min) 32 min    Activity Tolerance Patient tolerated treatment well    Behavior During Therapy [facility] for tasks assessed/performed           Past Medical History:  Diagnosis Date  . Bladder infection, chronic   . Cancer ([place]) 02/2018   right breast cancer  . Complication of anesthesia   . GERD (gastroesophageal reflux disease)   . Hemorrhoids   . Hypercholesterolemia   . Polymyalgia rheumatica ([place])   . PONV (postoperative nausea and vomiting)   . S/P colonoscopy Jun 14, 2004   friable internal and external hemorrhoids, left-sided diverticula  . S/P endoscopy August 13, 2006   pale 1-3 mm nodules consistent with benign squamous papilloma, tiny hiatal hernia  . Serrated adenoma of colon     Past Surgical History:  Procedure Laterality Date  . ABDOMINAL HYSTERECTOMY    . BREAST LUMPECTOMY WITH RADIOACTIVE SEED LOCALIZATION Right 03/12/2018   Procedure: RIGHT BREAST LUMPECTOMY WITH RADIOACTIVE SEED LOCALIZATION;  Surgeon: Fanny Skates, MD;  Location: [facility];  Service: General;  Laterality: Right;  . CHOLECYSTECTOMY    . COLONOSCOPY  11/20/2010   Dr. Gala Romney- serrated adenomaL side diverticulosis, hemorrhoids  . COLONOSCOPY  06/14/04   friable internal and external hemorrhoids, left-sided diverticula  . COLONOSCOPY N/A 08/23/2016   Dr. Gala Romney: Diverticulosis, medium-sized grade 2  internal hemorrhoids.  . ESOPHAGOGASTRODUODENOSCOPY  08/13/06   pale 1-3 mm nodules consistent with benign squamous papilloma, tiny hiatal hernia  . ESOPHAGOGASTRODUODENOSCOPY N/A 08/23/2016   Dr. Gala Romney: Normal  . FLEXIBLE SIGMOIDOSCOPY  11/15/2011   Procedure: FLEXIBLE SIGMOIDOSCOPY;  Surgeon: Daneil Dolin, MD;  [facility];  Service: Endoscopy;  Laterality: N/A;  12:30PM  . WRIST FRACTURE SURGERY Right     There were no vitals filed for this visit.   Subjective Assessment - 01/21/20 1015    Subjective Patient says she has been doing really well until yesterday. Says she was very sore when she woke up yesterday. Says the only thing she can think of is she got a new riding mower. Says she has been doing HEP and is good with these.    Limitations Standing;Walking;House hold activities    Diagnostic tests MRI    Patient Stated Goals to be able to walk normal    Currently in Pain? Yes    Pain Score 4     Pain Location Back    Pain Orientation Posterior    Pain Descriptors / Indicators Discomfort    Pain Type Chronic pain    Pain Onset More than a month ago    Pain Frequency Intermittent                             [facility] Adult PT  Treatment/Exercise - 01/21/20 0001      Exercises   Exercises Lumbar      Lumbar Exercises: Stretches   Double Knee to Chest Stretch 5 reps;10 seconds      Lumbar Exercises: Supine   Ab Set 10 reps;5 seconds    Bent Knee Raise 20 reps    Bridge 10 reps;5 seconds    Other Supine Lumbar Exercises iso hip abd/ add 10 x 5" each                     PT Short Term Goals - 01/05/20 0939      PT SHORT TERM GOAL #1   Title Patient will be independent with initial HEP and self-management strategies to improve functional outcomes    Time 2    Period Weeks    Status New    Target Date 01/22/20             PT Long Term Goals - 01/05/20 0939      PT LONG TERM GOAL #1   Title Patient will improve FOTO score by  10% to indicate improvement in functional outcomes    Time 4    Period Weeks    Status New    Target Date 02/05/20      PT LONG TERM GOAL #2   Title Patient will have equal to or > 4/5 MMT throughout BLE to improve ability to perform functional mobility, stair ambulation and ADLs.    Time 4    Period Weeks    Status New    Target Date 02/05/20      PT LONG TERM GOAL #3   Title Patient will report at least 65% overall improvement in subjective complaint to indicate improvement in ability to perform ADLs.    Time 4    Period Weeks    Status New    Target Date 02/05/20                 Plan - 01/21/20 1531    Clinical Impression Statement Reviewed HEP from evaluation. Initiated ther ex program. Session focused on progressing core and glute strengthening exercises on mat. Patient required verbal cues for proper form and set up with all added exercises today. Patient tolerated all activity well today with no increased complaint of pain. Discussed possible contributions to pain from SI joint, and spine anatomy in relation to MRI findings. Answered all patient questions.  Added hip adduction/ abduction isometrics for hip and pelvic stabilization. Patient educated on and issued updated HEP handout at end of session.    Personal Factors and Comorbidities Age    Examination-Activity Limitations Lift;Locomotion Level;Transfers    Examination-Participation Restrictions Laundry;Yard Work;Community Activity    Stability/Clinical Decision Making Stable/Uncomplicated    Rehab Potential Good    PT Frequency 1x / week    PT Duration 4 weeks    PT Treatment/Interventions ADLs/Self Care Home Management;Aquatic Therapy;Biofeedback;Cryotherapy;Electrical Stimulation;Iontophoresis 4mg /ml Dexamethasone;Moist Heat;Traction;Balance training;Manual techniques;Therapeutic exercise;Therapeutic activities;Functional mobility training;Orthotic Fit/Training;Stair training;Gait training;Patient/family  education;DME Instruction;Contrast Bath;Neuromuscular re-education;Ultrasound;Parrafin;Fluidtherapy;Compression bandaging;Scar mobilization;Passive range of motion;Spinal Manipulations;Joint Manipulations;Dry needling;Energy conservation;Splinting;Taping;Vasopneumatic Device    PT Next Visit Plan Assess response to updated HEP. Progress functional hip and core strengthening as tolerated with focus on building weekly HEP. Progress standing functional strengthening for glutes and core next session and issue updated HEP. Possible DC in next 1-2 visits per patient request.    PT Home Exercise Plan 01/05/20: sit to stand, heel raises 01/21/20: bridge, ab brace, ab march,  hip iso abd/add, DKTC    Consulted and Agree with Plan of Care Patient           Patient will benefit from skilled therapeutic intervention in order to improve the following deficits and impairments:  Abnormal gait, Pain, Decreased strength, Decreased activity tolerance, Increased fascial restricitons, Improper body mechanics, Difficulty walking  Visit Diagnosis: Radiculopathy, lumbar region  Muscle weakness (generalized)     Problem List Patient Active Problem List   Diagnosis Date Noted  . Breast neoplasm, Tis (DCIS), 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  . Hemorrhoids 11/22/2016  . Hx of adenomatous colonic polyps 07/20/2016  . Rectal bleeding 07/20/2016  . Constipation 07/20/2016  . GERD (gastroesophageal reflux disease) 07/20/2016  . Heme + stool 10/30/2010   3:39 PM, 01/21/20 Josue Hector PT DPT  Physical Therapist with [facility]  [phone] [place] [address] [place], [place], [address] Phone: [phone]   Fax:  [phone]  Name: [name] MRN: [id number] Date of Birth: 12/12/1934

## 2020-01-28 ENCOUNTER — Ambulatory Visit (HOSPITAL_COMMUNITY): Payer: PPO | Admitting: Physical Therapy

## 2020-01-28 ENCOUNTER — Other Ambulatory Visit: Payer: Self-pay

## 2020-01-28 DIAGNOSIS — M5416 Radiculopathy, lumbar region: Secondary | ICD-10-CM | POA: Diagnosis not present

## 2020-01-28 DIAGNOSIS — M6281 Muscle weakness (generalized): Secondary | ICD-10-CM

## 2020-01-28 NOTE — Therapy (Signed)
[name], [place], [address] Phone: [phone]   Fax:  [phone]  Physical Therapy Treatment  Patient Details  Name: [name] MRN: [id number] Date of Birth: 1934/06/08 Referring Provider (PT): Magnus Sinning MD    Encounter Date: 01/28/2020   PT End of Session - 01/28/20 1010    Visit Number 3    Number of Visits 4    Date for PT Re-Evaluation 02/05/20    Authorization Type [employer] (no auth, no VL)    PT Start Time 0918    PT Stop Time 1000    PT Time Calculation (min) 42 min    Activity Tolerance Patient tolerated treatment well    Behavior During Therapy [facility] for tasks assessed/performed           Past Medical History:  Diagnosis Date  . Bladder infection, chronic   . Cancer ([place]) 02/2018   right breast cancer  . Complication of anesthesia   . GERD (gastroesophageal reflux disease)   . Hemorrhoids   . Hypercholesterolemia   . Polymyalgia rheumatica ([place])   . PONV (postoperative nausea and vomiting)   . S/P colonoscopy Jun 14, 2004   friable internal and external hemorrhoids, left-sided diverticula  . S/P endoscopy August 13, 2006   pale 1-3 mm nodules consistent with benign squamous papilloma, tiny hiatal hernia  . Serrated adenoma of colon     Past Surgical History:  Procedure Laterality Date  . ABDOMINAL HYSTERECTOMY    . BREAST LUMPECTOMY WITH RADIOACTIVE SEED LOCALIZATION Right 03/12/2018   Procedure: RIGHT BREAST LUMPECTOMY WITH RADIOACTIVE SEED LOCALIZATION;  Surgeon: Fanny Skates, MD;  Location: [facility];  Service: General;  Laterality: Right;  . CHOLECYSTECTOMY    . COLONOSCOPY  11/20/2010   Dr. Gala Romney- serrated adenomaL side diverticulosis, hemorrhoids  . COLONOSCOPY  06/14/04   friable internal and external hemorrhoids, left-sided diverticula  . COLONOSCOPY N/A 08/23/2016   Dr. Gala Romney: Diverticulosis, medium-sized grade 2 internal hemorrhoids.  .  ESOPHAGOGASTRODUODENOSCOPY  08/13/06   pale 1-3 mm nodules consistent with benign squamous papilloma, tiny hiatal hernia  . ESOPHAGOGASTRODUODENOSCOPY N/A 08/23/2016   Dr. Gala Romney: Normal  . FLEXIBLE SIGMOIDOSCOPY  11/15/2011   Procedure: FLEXIBLE SIGMOIDOSCOPY;  Surgeon: Daneil Dolin, MD;  [facility];  Service: Endoscopy;  Laterality: N/A;  12:30PM  . WRIST FRACTURE SURGERY Right     There were no vitals filed for this visit.   Subjective Assessment - 01/28/20 1017    Subjective pt states she is doing so good she would like for today to be her last day.  currenlty wthout any pain or soreness. Reports compliance with HEP.    Currently in Pain? No/denies                             [facility] Adult PT Treatment/Exercise - 01/28/20 0921      Lumbar Exercises: Stretches   Double Knee to Chest Stretch 5 reps;10 seconds      Lumbar Exercises: Standing   Heel Raises 10 reps    Functional Squats 10 reps    Forward Lunge 10 reps    Forward Lunge Limitations onto 4" step without UE    Other Standing Lumbar Exercises hip abduction and extension 10 reps each       Lumbar Exercises: Supine   Ab Set 10 reps;5 seconds    Bent Knee Raise 20 reps  Bent Knee Raise Limitations alternating march with ab set    Bridge 10 reps;5 seconds;Limitations    Bridge Limitations 2 sets of 10    Straight Leg Raise 10 reps      Lumbar Exercises: Sidelying   Clam 10 reps;Both;Limitations    Clam Limitations 2 sets each                    PT Short Term Goals - 01/05/20 0939      PT SHORT TERM GOAL #1   Title Patient will be independent with initial HEP and self-management strategies to improve functional outcomes    Time 2    Period Weeks    Status New    Target Date 01/22/20             PT Long Term Goals - 01/05/20 0939      PT LONG TERM GOAL #1   Title Patient will improve FOTO score by 10% to indicate improvement in functional outcomes    Time 4     Period Weeks    Status New    Target Date 02/05/20      PT LONG TERM GOAL #2   Title Patient will have equal to or > 4/5 MMT throughout BLE to improve ability to perform functional mobility, stair ambulation and ADLs.    Time 4    Period Weeks    Status New    Target Date 02/05/20      PT LONG TERM GOAL #3   Title Patient will report at least 65% overall improvement in subjective complaint to indicate improvement in ability to perform ADLs.    Time 4    Period Weeks    Status New    Target Date 02/05/20                 Plan - 01/28/20 1018    Clinical Impression Statement Pt returns today requesting to be discharged as she is doing so much better.  Began standing exercises with noted difficulty with Lt hip abd/ext and form with all activities.  Pt then decided she needs 1-2 more additional visits to work on this before discharge.  4 new standing exercises printed and given for HEP.  Pt requires cues for hold times and completing therex more slowly and controlled.   Lt hip much weaker with standing hip abd than Rt.  No pain reported at end of session.    Personal Factors and Comorbidities Age    Examination-Activity Limitations Lift;Locomotion Level;Transfers    Examination-Participation Restrictions Laundry;Yard Work;Community Activity    Stability/Clinical Decision Making Stable/Uncomplicated    Rehab Potential Good    PT Frequency 1x / week    PT Duration 4 weeks    PT Treatment/Interventions ADLs/Self Care Home Management;Aquatic Therapy;Biofeedback;Cryotherapy;Electrical Stimulation;Iontophoresis 4mg /ml Dexamethasone;Moist Heat;Traction;Balance training;Manual techniques;Therapeutic exercise;Therapeutic activities;Functional mobility training;Orthotic Fit/Training;Stair training;Gait training;Patient/family education;DME Instruction;Contrast Bath;Neuromuscular re-education;Ultrasound;Parrafin;Fluidtherapy;Compression bandaging;Scar mobilization;Passive range of motion;Spinal  Manipulations;Joint Manipulations;Dry needling;Energy conservation;Splinting;Taping;Vasopneumatic Device    PT Next Visit Plan Progress functional hip and core strengthening as tolerated with focus on building weekly HEP. Progress standing functional strengthening for glutes and core. Possible DC in next 1-2 visits per patient request.    PT Home Exercise Plan 01/05/20: sit to stand, heel raises 01/21/20: bridge, ab brace, ab march, hip iso abd/add, DKTC   9/23:  standing hip abduction, extension, squats and lunges    Consulted and Agree with Plan of Care Patient  Patient will benefit from skilled therapeutic intervention in order to improve the following deficits and impairments:  Abnormal gait, Pain, Decreased strength, Decreased activity tolerance, Increased fascial restricitons, Improper body mechanics, Difficulty walking  Visit Diagnosis: Radiculopathy, lumbar region  Muscle weakness (generalized)     Problem List Patient Active Problem List   Diagnosis Date Noted  . Breast neoplasm, Tis (DCIS), 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  . Hemorrhoids 11/22/2016  . Hx of adenomatous colonic polyps 07/20/2016  . Rectal bleeding 07/20/2016  . Constipation 07/20/2016  . GERD (gastroesophageal reflux disease) 07/20/2016  . Heme + stool 10/30/2010   Teena Irani, PTA/CLT [phone]  Teena Irani 01/28/2020, 10:22 AM  [place] [address] [place], [place], [address] Phone: [phone]   Fax:  [phone]  Name: [name] MRN: [id number] Date of Birth: 1935/02/24

## 2020-02-01 ENCOUNTER — Other Ambulatory Visit (HOSPITAL_COMMUNITY): Payer: Self-pay | Admitting: Hematology

## 2020-02-01 DIAGNOSIS — D0511 Intraductal carcinoma in situ of right breast: Secondary | ICD-10-CM

## 2020-02-04 ENCOUNTER — Ambulatory Visit (HOSPITAL_COMMUNITY): Payer: PPO | Admitting: Physical Therapy

## 2020-02-04 ENCOUNTER — Other Ambulatory Visit: Payer: Self-pay

## 2020-02-04 DIAGNOSIS — M6281 Muscle weakness (generalized): Secondary | ICD-10-CM

## 2020-02-04 DIAGNOSIS — M5416 Radiculopathy, lumbar region: Secondary | ICD-10-CM | POA: Diagnosis not present

## 2020-02-04 NOTE — Therapy (Addendum)
[facility] [address] [place], [place], [address] Phone: [phone]   Fax:  [phone]  Physical Therapy Treatment  Patient Details  Name: [name] MRN: [id number] Date of Birth: 02-05-1935 Referring Provider (PT): Magnus Sinning MD   PHYSICAL THERAPY DISCHARGE SUMMARY  Visits from Start of Care: 4  Current functional level related to goals / functional outcomes: See below    Remaining deficits: See below    Education / Equipment: See assessment  Plan: Patient agrees to discharge.  Patient goals were met. Patient is being discharged due to meeting the stated rehab goals.  ?????       Encounter Date: 02/04/2020   PT End of Session - 02/04/20 0956    Visit Number 4    Number of Visits 4    Date for PT Re-Evaluation 02/05/20    Authorization Type [employer] (no auth, no VL)    PT Start Time 0920    PT Stop Time 0955    PT Time Calculation (min) 35 min    Activity Tolerance Patient tolerated treatment well    Behavior During Therapy [facility] for tasks assessed/performed           Past Medical History:  Diagnosis Date  . Bladder infection, chronic   . Cancer ([place]) 02/2018   right breast cancer  . Complication of anesthesia   . GERD (gastroesophageal reflux disease)   . Hemorrhoids   . Hypercholesterolemia   . Polymyalgia rheumatica ([place])   . PONV (postoperative nausea and vomiting)   . S/P colonoscopy Jun 14, 2004   friable internal and external hemorrhoids, left-sided diverticula  . S/P endoscopy August 13, 2006   pale 1-3 mm nodules consistent with benign squamous papilloma, tiny hiatal hernia  . Serrated adenoma of colon     Past Surgical History:  Procedure Laterality Date  . ABDOMINAL HYSTERECTOMY    . BREAST LUMPECTOMY WITH RADIOACTIVE SEED LOCALIZATION Right 03/12/2018   Procedure: RIGHT BREAST LUMPECTOMY WITH RADIOACTIVE SEED LOCALIZATION;  Surgeon: Fanny Skates, MD;  Location: [facility];  Service: General;  Laterality: Right;  . CHOLECYSTECTOMY    . COLONOSCOPY  11/20/2010   Dr. Gala Romney- serrated adenomaL side diverticulosis, hemorrhoids  . COLONOSCOPY  06/14/04   friable internal and external hemorrhoids, left-sided diverticula  . COLONOSCOPY N/A 08/23/2016   Dr. Gala Romney: Diverticulosis, medium-sized grade 2 internal hemorrhoids.  . ESOPHAGOGASTRODUODENOSCOPY  08/13/06   pale 1-3 mm nodules consistent with benign squamous papilloma, tiny hiatal hernia  . ESOPHAGOGASTRODUODENOSCOPY N/A 08/23/2016   Dr. Gala Romney: Normal  . FLEXIBLE SIGMOIDOSCOPY  11/15/2011   Procedure: FLEXIBLE SIGMOIDOSCOPY;  Surgeon: Daneil Dolin, MD;  [facility];  Service: Endoscopy;  Laterality: N/A;  12:30PM  . WRIST FRACTURE SURGERY Right     There were no vitals filed for this visit.   Subjective Assessment - 02/04/20 0931    Subjective pt states she is ready to be discharged today.  States she has good and bad days but overall 75% better.    Currently in Pain? No/denies              [facility] PT Assessment - 02/04/20 0934      Assessment   Medical Diagnosis lumbar pain with LT radiculopathy     Referring Provider (PT) Magnus Sinning MD     Onset Date/Surgical Date --   1 year    Next MD Visit --   None scheduled  Prior Therapy No      Precautions   Precautions None      Restrictions   Weight Bearing Restrictions No      Home Environment   Living Environment Private residence    Living Arrangements Alone      Prior Function   Level of Independence Independent      Cognition   Overall Cognitive Status Within Functional Limits for tasks assessed      Observation/Other Assessments   Focus on Therapeutic Outcomes (FOTO)  limited 6%   was 51% limited     AROM   Overall AROM Comments Lumbar AROM WFL       Strength   Right Hip Flexion 5/5   was 4+/4   Right Hip Extension 4+/5   was 4/5   Right Hip ABduction 3-/5   was 3+/5   Left Hip Flexion 5/5   was   4/5   Left Hip Extension 4+/5   was 3+/5   Left Hip ABduction 4+/5   was 4/5     Transfers   Five time sit to stand comments  7.5 sec with no UE    was 8.4 seconds without UE from standard chair     Ambulation/Gait   Ambulation/Gait Assistance 7: Independent    Ambulation Distance (Feet) 500 Feet    Assistive device None    Gait Comments 2MWT                                    PT Short Term Goals - 02/04/20 0958      PT SHORT TERM GOAL #1   Title Patient will be independent with initial HEP and self-management strategies to improve functional outcomes    Time 2    Period Weeks    Status Achieved    Target Date 01/22/20             PT Long Term Goals - 02/04/20 0958      PT LONG TERM GOAL #1   Title Patient will improve FOTO score by 10% to indicate improvement in functional outcomes    Time 4    Period Weeks    Status Achieved      PT LONG TERM GOAL #2   Title Patient will have equal to or > 4/5 MMT throughout BLE to improve ability to perform functional mobility, stair ambulation and ADLs.    Time 4    Period Weeks    Status Partially Met   Rt hip abductors remain weak     PT LONG TERM GOAL #3   Title Patient will report at least 65% overall improvement in subjective complaint to indicate improvement in ability to perform ADLs.    Time 4    Period Weeks    Status Achieved                 Plan - 02/04/20 0959    Clinical Impression Statement completed retesting for all evaluation measures with noted improvement in all areas.  Pt Met her STG's and 2/3 LTG's.  LTG not met was result of continued weakness in Rt hip abductors.  Encouraged pateint to conitnue with current actvitiy level and completion of HEP daily.  FOTO score drastically imrproved with only 6% deficit and 75% subjective improvement overall.  Pt is independent with HEP, reviewed and has all instructions.    Personal Factors and Comorbidities Age  Examination-Activity  Limitations Lift;Locomotion Level;Transfers    Examination-Participation Restrictions Laundry;Yard Work;Community Activity    Stability/Clinical Decision Making Stable/Uncomplicated    Rehab Potential Good    PT Frequency 1x / week    PT Duration 4 weeks    PT Treatment/Interventions ADLs/Self Care Home Management;Aquatic Therapy;Biofeedback;Cryotherapy;Electrical Stimulation;Iontophoresis 80m/ml Dexamethasone;Moist Heat;Traction;Balance training;Manual techniques;Therapeutic exercise;Therapeutic activities;Functional mobility training;Orthotic Fit/Training;Stair training;Gait training;Patient/family education;DME Instruction;Contrast Bath;Neuromuscular re-education;Ultrasound;Parrafin;Fluidtherapy;Compression bandaging;Scar mobilization;Passive range of motion;Spinal Manipulations;Joint Manipulations;Dry needling;Energy conservation;Splinting;Taping;Vasopneumatic Device    PT Next Visit Plan Discharge to home exercise program.    PT Home Exercise Plan 01/05/20: sit to stand, heel raises 01/21/20: bridge, ab brace, ab march, hip iso abd/add, DKTC   9/23:  standing hip abduction, extension, squats and lunges    Consulted and Agree with Plan of Care Patient           Patient will benefit from skilled therapeutic intervention in order to improve the following deficits and impairments:  Abnormal gait, Pain, Decreased strength, Decreased activity tolerance, Increased fascial restricitons, Improper body mechanics, Difficulty walking  Visit Diagnosis: Muscle weakness (generalized)  Radiculopathy, lumbar region     Problem List Patient Active Problem List   Diagnosis Date Noted  . Breast neoplasm, Tis (DCIS), 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  . Hemorrhoids 11/22/2016  . Hx of adenomatous colonic polyps 07/20/2016  . Rectal bleeding 07/20/2016  . Constipation 07/20/2016  . GERD (gastroesophageal reflux disease) 07/20/2016  . Heme + stool 10/30/2010   ATeena Irani PTA/CLT [phone] FTeena Irani 02/04/2020, 10:02 AM  5:53 PM, 02/04/20 CJosue HectorPT DPT  [facility] [phone] [place] [address]Phone: [phone]  Fax:  [phone] Name: BCYPRESS HINKSONMRN: [id number] of Birth: 108/21/1936

## 2020-02-11 ENCOUNTER — Ambulatory Visit (HOSPITAL_COMMUNITY): Payer: PPO | Admitting: Physical Therapy

## 2020-02-15 ENCOUNTER — Other Ambulatory Visit (HOSPITAL_COMMUNITY): Payer: Self-pay

## 2020-02-15 DIAGNOSIS — D0511 Intraductal carcinoma in situ of right breast: Secondary | ICD-10-CM

## 2020-02-16 ENCOUNTER — Ambulatory Visit (HOSPITAL_COMMUNITY)
Admission: RE | Admit: 2020-02-16 | Discharge: 2020-02-16 | Disposition: A | Discharge status: Home / Self Care | Payer: PPO | Source: Ambulatory Visit | Attending: Nurse Practitioner | Admitting: Nurse Practitioner

## 2020-02-16 ENCOUNTER — Inpatient Hospital Stay (HOSPITAL_COMMUNITY): Payer: PPO | Attending: Hematology

## 2020-02-16 ENCOUNTER — Other Ambulatory Visit: Payer: Self-pay

## 2020-02-16 DIAGNOSIS — D0511 Intraductal carcinoma in situ of right breast: Secondary | ICD-10-CM | POA: Diagnosis not present

## 2020-02-16 DIAGNOSIS — C50211 Malignant neoplasm of upper-inner quadrant of right female breast: Secondary | ICD-10-CM | POA: Insufficient documentation

## 2020-02-16 DIAGNOSIS — Z8249 Family history of ischemic heart disease and other diseases of the circulatory system: Secondary | ICD-10-CM | POA: Insufficient documentation

## 2020-02-16 DIAGNOSIS — Z170 Estrogen receptor positive status [ER+]: Secondary | ICD-10-CM | POA: Diagnosis not present

## 2020-02-16 DIAGNOSIS — R350 Frequency of micturition: Secondary | ICD-10-CM | POA: Diagnosis not present

## 2020-02-16 DIAGNOSIS — Z8349 Family history of other endocrine, nutritional and metabolic diseases: Secondary | ICD-10-CM | POA: Diagnosis not present

## 2020-02-16 DIAGNOSIS — Z808 Family history of malignant neoplasm of other organs or systems: Secondary | ICD-10-CM | POA: Insufficient documentation

## 2020-02-16 DIAGNOSIS — Z818 Family history of other mental and behavioral disorders: Secondary | ICD-10-CM | POA: Insufficient documentation

## 2020-02-16 DIAGNOSIS — R42 Dizziness and giddiness: Secondary | ICD-10-CM | POA: Diagnosis not present

## 2020-02-16 DIAGNOSIS — D72829 Elevated white blood cell count, unspecified: Secondary | ICD-10-CM | POA: Insufficient documentation

## 2020-02-16 DIAGNOSIS — M810 Age-related osteoporosis without current pathological fracture: Secondary | ICD-10-CM | POA: Diagnosis not present

## 2020-02-16 DIAGNOSIS — R928 Other abnormal and inconclusive findings on diagnostic imaging of breast: Secondary | ICD-10-CM | POA: Diagnosis not present

## 2020-02-16 DIAGNOSIS — Z79899 Other long term (current) drug therapy: Secondary | ICD-10-CM | POA: Insufficient documentation

## 2020-02-16 DIAGNOSIS — G479 Sleep disorder, unspecified: Secondary | ICD-10-CM | POA: Diagnosis not present

## 2020-02-16 DIAGNOSIS — R5383 Other fatigue: Secondary | ICD-10-CM | POA: Insufficient documentation

## 2020-02-16 DIAGNOSIS — Z23 Encounter for immunization: Secondary | ICD-10-CM | POA: Diagnosis not present

## 2020-02-16 DIAGNOSIS — Z853 Personal history of malignant neoplasm of breast: Secondary | ICD-10-CM | POA: Diagnosis not present

## 2020-02-16 LAB — LACTATE DEHYDROGENASE: LDH: 463 U/L — ABNORMAL HIGH (ref 98–192)

## 2020-02-16 LAB — CBC WITH DIFFERENTIAL/PLATELET
Band Neutrophils: 2 %
Basophils Absolute: 1.7 10*3/uL — ABNORMAL HIGH (ref 0.0–0.1)
Basophils Relative: 5 %
Blasts: 5 %
Eosinophils Absolute: 0.7 10*3/uL — ABNORMAL HIGH (ref 0.0–0.5)
Eosinophils Relative: 2 %
HCT: 37.3 % (ref 36.0–46.0)
Hemoglobin: 12.2 g/dL (ref 12.0–15.0)
Lymphocytes Relative: 6 %
Lymphs Abs: 2 10*3/uL (ref 0.7–4.0)
MCH: 31 pg (ref 26.0–34.0)
MCHC: 32.7 g/dL (ref 30.0–36.0)
MCV: 94.9 fL (ref 80.0–100.0)
Metamyelocytes Relative: 6 %
Monocytes Absolute: 2 10*3/uL — ABNORMAL HIGH (ref 0.1–1.0)
Monocytes Relative: 6 %
Myelocytes: 4 %
Neutro Abs: 19.6 10*3/uL — ABNORMAL HIGH (ref 1.7–7.7)
Neutrophils Relative %: 57 %
Platelets: 317 10*3/uL (ref 150–400)
Promyelocytes Relative: 7 %
RBC: 3.93 MIL/uL (ref 3.87–5.11)
RDW: 14.8 % (ref 11.5–15.5)
WBC: 33.2 10*3/uL — ABNORMAL HIGH (ref 4.0–10.5)
nRBC: 0.2 % (ref 0.0–0.2)

## 2020-02-16 LAB — VITAMIN B12: Vitamin B-12: 2685 pg/mL — ABNORMAL HIGH (ref 180–914)

## 2020-02-16 LAB — COMPREHENSIVE METABOLIC PANEL
ALT: 22 U/L (ref 0–44)
AST: 30 U/L (ref 15–41)
Albumin: 3.9 g/dL (ref 3.5–5.0)
Alkaline Phosphatase: 32 U/L — ABNORMAL LOW (ref 38–126)
Anion gap: 10 (ref 5–15)
BUN: 17 mg/dL (ref 8–23)
CO2: 26 mmol/L (ref 22–32)
Calcium: 9.2 mg/dL (ref 8.9–10.3)
Chloride: 103 mmol/L (ref 98–111)
Creatinine, Ser: 0.81 mg/dL (ref 0.44–1.00)
GFR, Estimated: >60 mL/min (ref >60)
Glucose, Bld: 114 mg/dL — ABNORMAL HIGH (ref 70–99)
Potassium: 4.2 mmol/L (ref 3.5–5.1)
Sodium: 139 mmol/L (ref 135–145)
Total Bilirubin: 0.8 mg/dL (ref 0.3–1.2)
Total Protein: 6 g/dL — ABNORMAL LOW (ref 6.5–8.1)

## 2020-02-16 LAB — VITAMIN D 25 HYDROXY (VIT D DEFICIENCY, FRACTURES): Vit D, 25-Hydroxy: 50.44 ng/mL (ref 30–100)

## 2020-02-16 LAB — FOLATE: Folate: 47.7 ng/mL (ref >5.9)

## 2020-02-17 LAB — PATHOLOGIST SMEAR REVIEW

## 2020-02-22 ENCOUNTER — Other Ambulatory Visit: Payer: Self-pay

## 2020-02-22 ENCOUNTER — Inpatient Hospital Stay (HOSPITAL_COMMUNITY): Payer: PPO | Admitting: Hematology

## 2020-02-22 VITALS — BP 136/54 | HR 71 | Resp 16 | Wt 154.4 lb

## 2020-02-22 DIAGNOSIS — D0511 Intraductal carcinoma in situ of right breast: Secondary | ICD-10-CM | POA: Diagnosis not present

## 2020-02-22 DIAGNOSIS — C50211 Malignant neoplasm of upper-inner quadrant of right female breast: Secondary | ICD-10-CM | POA: Diagnosis not present

## 2020-02-22 DIAGNOSIS — D72825 Bandemia: Secondary | ICD-10-CM

## 2020-02-22 DIAGNOSIS — D72829 Elevated white blood cell count, unspecified: Secondary | ICD-10-CM | POA: Insufficient documentation

## 2020-02-22 NOTE — Progress Notes (Signed)
[facility] [address] [place] [address]   Patient Care Team: Celene Squibb, MD as PCP - General (Internal Medicine) Gala Romney Cristopher Estimable, MD (Gastroenterology)  SUMMARY OF ONCOLOGIC HISTORY: Oncology History   No history exists.    CHIEF COMPLIANT: Follow-up for right breast cancer   INTERVAL HISTORY: [name] is a 85 y.o. female here today for follow up of her right breast cancer. Her last visit was on 10/09/2018.   Today she reports feeling well. She denies having any fatigue, recent infections or easy bruising.  She denies presence of cancer or leukemia in her family.   REVIEW OF SYSTEMS:   Review of Systems  Constitutional: Negative for appetite change and fatigue.  Genitourinary: Positive for frequency.   Neurological: Positive for dizziness (occasional).  Hematological: Does not bruise/bleed easily.  Psychiatric/Behavioral: Positive for sleep disturbance.  All other systems reviewed and are negative.   I have reviewed the past medical history, past surgical history, social history and family history with the patient and they are unchanged from previous note.   ALLERGIES:   is allergic to fish allergy and shrimp [shellfish allergy].   MEDICATIONS:  Current Outpatient Medications  Medication Sig Dispense Refill  . Calcium Carb-Cholecalciferol (CALCIUM 600 + D PO) Take 1 tablet by mouth daily.     . cephALEXin (KEFLEX) 250 MG capsule Take 250 mg by mouth daily.    . Cholecalciferol (VITAMIN D3) 250 MCG (10000 UT) TABS Take by mouth.    . Multiple Vitamin (MULTIVITAMIN WITH MINERALS) TABS tablet Take 1 tablet by mouth daily.    . Multiple Vitamins-Minerals (ZINC PO) Take by mouth.    . polyethylene glycol (MIRALAX / GLYCOLAX) packet Take 17 g by mouth daily as needed.    . tamoxifen (NOLVADEX) 20 MG tablet TAKE 1 TABLET BY MOUTH DAILY. 90 tablet 3  . Wheat Dextrin (BENEFIBER) POWD Take by mouth 2 (two) times daily. Takes 1 tbsp twice a  day      No current facility-administered medications for this visit.     PHYSICAL EXAMINATION: Performance status (ECOG): 1 - Symptomatic but completely ambulatory  Vitals:   02/22/20 1538  BP: (!) 136/54  Pulse: 71  Resp: 16  SpO2: 97%   Wt Readings from Last 3 Encounters:  02/22/20 154 lb 6.4 oz (70 kg)  11/24/19 148 lb (67.1 kg)  10/13/19 151 lb (68.5 kg)   Physical Exam Vitals reviewed.  Constitutional:      Appearance: Normal appearance.  Cardiovascular:     Rate and Rhythm: Normal rate and regular rhythm.     Pulses: Normal pulses.     Heart sounds: Normal heart sounds.  Pulmonary:     Effort: Pulmonary effort is normal.     Breath sounds: Normal breath sounds.  Chest:     Breasts:        Right: Normal.        Left: Normal.    Abdominal:     Palpations: Abdomen is soft. There is no hepatomegaly, splenomegaly or mass.     Tenderness: There is no abdominal tenderness.     Hernia: No hernia is present.  Neurological:     General: No focal deficit present.     Mental Status: She is alert and oriented to person, place, and time.  Psychiatric:        Mood and Affect: Mood normal.        Behavior: Behavior normal.  Breast Exam Chaperone: Milinda Antis, MD     LABORATORY DATA:  I have reviewed the data as listed CMP Latest Ref Rng & Units 02/16/2020 08/06/2019 04/06/2019  Glucose 70 - 99 mg/dL 114(H) 109(H) 109(H)  BUN 8 - 23 mg/dL 17 20 18   Creatinine 0.44 - 1.00 mg/dL 0.81 0.77 0.76  Sodium 135 - 145 mmol/L 139 139 141  Potassium 3.5 - 5.1 mmol/L 4.2 4.5 4.7  Chloride 98 - 111 mmol/L 103 103 107  CO2 22 - 32 mmol/L 26 26 25   Calcium 8.9 - 10.3 mg/dL 9.2 9.2 8.8(L)  Total Protein 6.5 - 8.1 g/dL 6.0(L) 6.8 6.5  Total Bilirubin 0.3 - 1.2 mg/dL 0.8 1.0 0.7  Alkaline Phos 38 - 126 U/L 32(L) 35(L) 37(L)  AST 15 - 41 U/L 30 22 24   ALT 0 - 44 U/L 22 18 22    No results found for: MGQ676 Lab Results  Component Value Date   WBC 33.2 (H) 02/16/2020     HGB 12.2 02/16/2020   HCT 37.3 02/16/2020   MCV 94.9 02/16/2020   PLT 317 02/16/2020   NEUTROABS 19.6 (H) 02/16/2020   Lab Results  Component Value Date   LDH 463 (H) 02/16/2020   LDH 188 08/06/2019   Lab Results  Component Value Date   VD25OH 50.44 02/16/2020   VD25OH 39.63 08/06/2019   VD25OH 41.52 04/06/2019    ASSESSMENT:  1.  Right breast DCIS: - Screening mammogram on 02/03/2018 was abnormal. -Ultrasound of the right breast on 02/11/2018 shows mass measuring 0.5 x 0.4 x 0.5 cm, circumscribed hypoechoic in the 3:30 o'clock location of the right breast.  Right axilla ultrasound was negative for adenopathy. - Right breast core biopsy on 02/18/2018 shows ductal carcinoma with papillary configuration.  In the comment section, it notes that this may represent DCIS involving a papillary lesion or it may represent an intracystic papillary ductal carcinoma.  ER/PR was 95% positive. - Right breast lumpectomy on 03/12/2018, pathology showing intermediate grade DCIS, 0.6 cm, with areas showing papillary configuration.  Posterior margin was 0.2 cm.  No evidence of invasion. - Radiation therapy was refused. -Tamoxifen for 5 years started on 04/09/2018. -Mammogram on 02/16/2020 was BI-RADS Category 2.  2.  Osteoporosis: -DEXA scan on 04/11/2018 shows T score of -2.9. -She has received some shots with Dr. Nevada Crane but could not tolerate them.   PLAN:  1.  Right breast DCIS: -Breast exam today showed stable score in the right breast upper inner quadrant with no palpable masses.  No palpable adenopathy. -She is tolerating tamoxifen reasonably well. -We also reviewed mammogram results.  2.  Osteoporosis: -Continue calcium and vitamin D.  Vitamin D level was 50.4.  3.  Leukocytosis: -White count found to be elevated at 33.2.  Hemoglobin and platelets were normal. -Leukocytosis is markedly left shifted.  About 5% blasts were seen.  We discussed the differential diagnosis.  -She does not  report any weakness or fevers or infections.  Mild easy bruising on the upper extremities. -Recommend flow cytometry for further work-up of leukemia. -We will also send BCR/ABL by FISH.  We will also repeat CBC, LDH, uric acid and fibrinogen levels. -RTC 1 week to discuss results.  Breast Cancer therapy associated bone loss: I have recommended calcium, Vitamin D and weight bearing exercises.   No orders of the defined types were placed in this encounter.  The patient has a good understanding of the overall plan. she agrees with it. she will call with  any problems that may develop before the next visit here.    Derek Jack, MD [facility] [phone]   I, Milinda Antis, am acting as a scribe for Dr. Sanda Linger.  I, Derek Jack MD, have reviewed the above documentation for accuracy and completeness, and I agree with the above.

## 2020-02-22 NOTE — Patient Instructions (Addendum)
[place] at [facility] Discharge Instructions  You were seen today by Dr. Delton Coombes. He went over your recent results. You will have labs drawn tomorrow for further analysis. Dr. Delton Coombes will see you back in 1 week for follow up.   Thank you for choosing [place] at [facility] to provide your oncology and hematology care.  To afford each patient quality time with our provider, please arrive at least 15 minutes before your scheduled appointment time.   If you have a lab appointment with the [facility] please come in thru the Main Entrance and check in at the main information desk  You need to re-schedule your appointment should you arrive 10 or more minutes late.  We strive to give you quality time with our providers, and arriving late affects you and other patients whose appointments are after yours.  Also, if you no show three or more times for appointments you may be dismissed from the clinic at the providers discretion.     Again, thank you for choosing [facility].  Our hope is that these requests will decrease the amount of time that you wait before being seen by our physicians.       _____________________________________________________________  Should you have questions after your visit to [facility], please contact our office at [phone] between the hours of 8:00 a.m. and 4:30 p.m.  Voicemails left after 4:00 p.m. will not be returned until the following business day.  For prescription refill requests, have your pharmacy contact our office and allow 72 hours.    Cancer Center Support Programs:   > Cancer Support Group  2nd Tuesday of the month 1pm-2pm, Journey Room

## 2020-02-23 ENCOUNTER — Inpatient Hospital Stay (HOSPITAL_COMMUNITY): Payer: PPO

## 2020-02-23 ENCOUNTER — Ambulatory Visit (HOSPITAL_COMMUNITY): Payer: PPO | Admitting: Nurse Practitioner

## 2020-02-23 DIAGNOSIS — D0511 Intraductal carcinoma in situ of right breast: Secondary | ICD-10-CM

## 2020-02-23 DIAGNOSIS — D493 Neoplasm of unspecified behavior of breast: Secondary | ICD-10-CM | POA: Diagnosis not present

## 2020-02-23 DIAGNOSIS — C50211 Malignant neoplasm of upper-inner quadrant of right female breast: Secondary | ICD-10-CM | POA: Diagnosis not present

## 2020-02-23 DIAGNOSIS — D72825 Bandemia: Secondary | ICD-10-CM

## 2020-02-23 LAB — CBC WITH DIFFERENTIAL/PLATELET
Band Neutrophils: 1 %
Basophils Absolute: 3.1 10*3/uL — ABNORMAL HIGH (ref 0.0–0.1)
Basophils Relative: 7 %
Blasts: 4 %
Eosinophils Absolute: 1.3 10*3/uL — ABNORMAL HIGH (ref 0.0–0.5)
Eosinophils Relative: 3 %
HCT: 39.5 % (ref 36.0–46.0)
Hemoglobin: 12.6 g/dL (ref 12.0–15.0)
Lymphocytes Relative: 8 %
Lymphs Abs: 3.5 10*3/uL (ref 0.7–4.0)
MCH: 30.8 pg (ref 26.0–34.0)
MCHC: 31.9 g/dL (ref 30.0–36.0)
MCV: 96.6 fL (ref 80.0–100.0)
Metamyelocytes Relative: 5 %
Monocytes Absolute: 0 10*3/uL — ABNORMAL LOW (ref 0.1–1.0)
Monocytes Relative: 0 %
Myelocytes: 7 %
Neutro Abs: 28 10*3/uL — ABNORMAL HIGH (ref 1.7–7.7)
Neutrophils Relative %: 63 %
Platelets: 335 10*3/uL (ref 150–400)
Promyelocytes Relative: 2 %
RBC: 4.09 MIL/uL (ref 3.87–5.11)
RDW: 15.2 % (ref 11.5–15.5)
WBC: 43.8 10*3/uL — ABNORMAL HIGH (ref 4.0–10.5)
nRBC: 0.2 % (ref 0.0–0.2)

## 2020-02-23 LAB — FIBRINOGEN: Fibrinogen: 255 mg/dL (ref 210–475)

## 2020-02-23 LAB — URIC ACID: Uric Acid, Serum: 8.2 mg/dL — ABNORMAL HIGH (ref 2.5–7.1)

## 2020-02-23 LAB — LACTATE DEHYDROGENASE: LDH: 567 U/L — ABNORMAL HIGH (ref 98–192)

## 2020-02-24 LAB — SURGICAL PATHOLOGY

## 2020-02-25 ENCOUNTER — Other Ambulatory Visit: Payer: Self-pay

## 2020-02-25 ENCOUNTER — Encounter: Payer: Self-pay | Admitting: Orthopaedic Surgery

## 2020-02-25 ENCOUNTER — Ambulatory Visit: Payer: PPO | Admitting: Orthopaedic Surgery

## 2020-02-25 VITALS — BP 121/65 | HR 71 | Ht 67.5 in | Wt 149.0 lb

## 2020-02-25 DIAGNOSIS — M2550 Pain in unspecified joint: Secondary | ICD-10-CM | POA: Diagnosis not present

## 2020-02-25 DIAGNOSIS — N390 Urinary tract infection, site not specified: Secondary | ICD-10-CM | POA: Diagnosis not present

## 2020-02-25 DIAGNOSIS — M545 Low back pain, unspecified: Secondary | ICD-10-CM

## 2020-02-25 DIAGNOSIS — Z0001 Encounter for general adult medical examination with abnormal findings: Secondary | ICD-10-CM | POA: Diagnosis not present

## 2020-02-25 DIAGNOSIS — Z0000 Encounter for general adult medical examination without abnormal findings: Secondary | ICD-10-CM | POA: Diagnosis not present

## 2020-02-25 DIAGNOSIS — M810 Age-related osteoporosis without current pathological fracture: Secondary | ICD-10-CM

## 2020-02-25 DIAGNOSIS — M79605 Pain in left leg: Secondary | ICD-10-CM

## 2020-02-25 DIAGNOSIS — M19049 Primary osteoarthritis, unspecified hand: Secondary | ICD-10-CM | POA: Diagnosis not present

## 2020-02-25 DIAGNOSIS — M25512 Pain in left shoulder: Secondary | ICD-10-CM | POA: Diagnosis not present

## 2020-02-25 DIAGNOSIS — E7849 Other hyperlipidemia: Secondary | ICD-10-CM | POA: Diagnosis not present

## 2020-02-25 DIAGNOSIS — Z712 Person consulting for explanation of examination or test findings: Secondary | ICD-10-CM | POA: Diagnosis not present

## 2020-02-25 DIAGNOSIS — C50311 Malignant neoplasm of lower-inner quadrant of right female breast: Secondary | ICD-10-CM | POA: Diagnosis not present

## 2020-02-25 DIAGNOSIS — M791 Myalgia, unspecified site: Secondary | ICD-10-CM | POA: Diagnosis not present

## 2020-02-25 DIAGNOSIS — H6981 Other specified disorders of Eustachian tube, right ear: Secondary | ICD-10-CM | POA: Diagnosis not present

## 2020-02-25 DIAGNOSIS — Z853 Personal history of malignant neoplasm of breast: Secondary | ICD-10-CM | POA: Diagnosis not present

## 2020-02-25 NOTE — Progress Notes (Signed)
I am much better  She has had second epidural.  Her back is much improved. She is walking well. She has no problems.  Encounter Diagnoses  Name Primary?  . Lumbar pain with radiation down left leg Yes  . Age-related osteoporosis without current pathological fracture    I will see as needed.  Call if any problem.  Precautions discussed.   Electronically Signed Sanjuana Kava, MD 10/21/20218:57 AM

## 2020-02-29 DIAGNOSIS — R7301 Impaired fasting glucose: Secondary | ICD-10-CM | POA: Diagnosis not present

## 2020-02-29 DIAGNOSIS — M810 Age-related osteoporosis without current pathological fracture: Secondary | ICD-10-CM | POA: Diagnosis not present

## 2020-02-29 DIAGNOSIS — M791 Myalgia, unspecified site: Secondary | ICD-10-CM | POA: Diagnosis not present

## 2020-02-29 DIAGNOSIS — M2550 Pain in unspecified joint: Secondary | ICD-10-CM | POA: Diagnosis not present

## 2020-02-29 DIAGNOSIS — N390 Urinary tract infection, site not specified: Secondary | ICD-10-CM | POA: Diagnosis not present

## 2020-02-29 DIAGNOSIS — Z853 Personal history of malignant neoplasm of breast: Secondary | ICD-10-CM | POA: Diagnosis not present

## 2020-02-29 DIAGNOSIS — E782 Mixed hyperlipidemia: Secondary | ICD-10-CM | POA: Diagnosis not present

## 2020-03-01 ENCOUNTER — Inpatient Hospital Stay (HOSPITAL_COMMUNITY): Payer: PPO | Admitting: Hematology

## 2020-03-01 ENCOUNTER — Other Ambulatory Visit: Payer: Self-pay

## 2020-03-01 VITALS — BP 135/53 | HR 75 | Temp 97.0°F | Resp 18 | Wt 154.1 lb

## 2020-03-01 DIAGNOSIS — C50211 Malignant neoplasm of upper-inner quadrant of right female breast: Secondary | ICD-10-CM | POA: Diagnosis not present

## 2020-03-01 DIAGNOSIS — D0511 Intraductal carcinoma in situ of right breast: Secondary | ICD-10-CM

## 2020-03-01 MED ORDER — INFLUENZA VAC A&B SA ADJ QUAD 0.5 ML IM PRSY
0.5000 mL | PREFILLED_SYRINGE | Freq: Once | INTRAMUSCULAR | Status: AC
  Administered 2020-03-01: 0.5 mL via INTRAMUSCULAR
  Filled 2020-03-01: qty 0.5

## 2020-03-01 NOTE — Progress Notes (Signed)
[name] presents today for injection per the provider's orders.  High Dose Flu administration without incident; injection site WNL; see MAR for injection details.  Patient tolerated procedure well and without incident.  No questions or complaints noted at this time.

## 2020-03-01 NOTE — Patient Instructions (Signed)
[place] at [facility] Discharge Instructions  You were seen today by Dr. Delton Coombes. 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 Dr. Delton Coombes will call you on 10/28 for follow up.   Thank you for choosing [place] at [facility] to provide your oncology and hematology care.  To afford each patient quality time with our provider, please arrive at least 15 minutes before your scheduled appointment time.   If you have a lab appointment with the [facility] please come in thru the Main Entrance and check in at the main information desk  You need to re-schedule your appointment should you arrive 10 or more minutes late.  We strive to give you quality time with our providers, and arriving late affects you and other patients whose appointments are after yours.  Also, if you no show three or more times for appointments you may be dismissed from the clinic at the providers discretion.     Again, thank you for choosing [facility].  Our hope is that these requests will decrease the amount of time that you wait before being seen by our physicians.       _____________________________________________________________  Should you have questions after your visit to [facility], please contact our office at [phone] between the hours of 8:00 a.m. and 4:30 p.m.  Voicemails left after 4:00 p.m. will not be returned until the following business day.  For prescription refill requests, have your pharmacy contact our office and allow 72 hours.    Cancer Center Support Programs:   > Cancer Support Group  2nd Tuesday of the month 1pm-2pm, Journey Room

## 2020-03-01 NOTE — Progress Notes (Signed)
[name] [address] [place] [address]   CLINIC:  Medical Oncology/Hematology  PCP:  Celene Squibb, MD [address] [place] [address] [phone]   REASON FOR VISIT:  Follow-up for right breast cancer and leukocytosis.  PRIOR THERAPY: Right breast lumpectomy on 03/12/2018  NGS Results: Not done  CURRENT THERAPY: Tamoxifen daily  BRIEF ONCOLOGIC HISTORY:  Oncology History   No history exists.    CANCER STAGING: Cancer Staging No matching staging information was found for the patient.  INTERVAL HISTORY:  [name], a 85 y.o. female, returns for routine follow-up of her right breast cancer. [name] was last seen on 02/22/2020.  Today she reports feeling well. She is tolerating the tamoxifen well.   REVIEW OF SYSTEMS:  Review of Systems  Constitutional: Positive for appetite change (50%) and fatigue (50%).  Neurological: Positive for dizziness.  Psychiatric/Behavioral: Positive for sleep disturbance.  All other systems reviewed and are negative.   PAST MEDICAL/SURGICAL HISTORY:  Past Medical History:  Diagnosis Date  . Bladder infection, chronic   . Cancer ([facility]) 02/2018   right breast cancer  . Complication of anesthesia   . GERD (gastroesophageal reflux disease)   . Hemorrhoids   . Hypercholesterolemia   . Polymyalgia rheumatica ([facility])   . PONV (postoperative nausea and vomiting)   . S/P colonoscopy Jun 14, 2004   friable internal and external hemorrhoids, left-sided diverticula  . S/P endoscopy August 13, 2006   pale 1-3 mm nodules consistent with benign squamous papilloma, tiny hiatal hernia  . Serrated adenoma of colon    Past Surgical History:  Procedure Laterality Date  . ABDOMINAL HYSTERECTOMY    . BREAST LUMPECTOMY WITH RADIOACTIVE SEED LOCALIZATION Right 03/12/2018   Procedure: RIGHT BREAST LUMPECTOMY WITH RADIOACTIVE SEED LOCALIZATION;  Surgeon: Fanny Skates, MD;  Location: [facility];   Service: General;  Laterality: Right;  . CHOLECYSTECTOMY    . COLONOSCOPY  11/20/2010   Dr. Gala Romney- serrated adenomaL side diverticulosis, hemorrhoids  . COLONOSCOPY  06/14/04   friable internal and external hemorrhoids, left-sided diverticula  . COLONOSCOPY N/A 08/23/2016   Dr. Gala Romney: Diverticulosis, medium-sized grade 2 internal hemorrhoids.  . ESOPHAGOGASTRODUODENOSCOPY  08/13/06   pale 1-3 mm nodules consistent with benign squamous papilloma, tiny hiatal hernia  . ESOPHAGOGASTRODUODENOSCOPY N/A 08/23/2016   Dr. Gala Romney: Normal  . FLEXIBLE SIGMOIDOSCOPY  11/15/2011   Procedure: FLEXIBLE SIGMOIDOSCOPY;  Surgeon: Daneil Dolin, MD;  [facility];  Service: Endoscopy;  Laterality: N/A;  12:30PM  . WRIST FRACTURE SURGERY Right     SOCIAL HISTORY:  Social History   Socioeconomic History  . Marital status: Widowed    Spouse name: Not on file  . Number of children: 2  . Years of education: Not on file  . Highest education level: Not on file  Occupational History  . Not on file  Tobacco Use  . Smoking status: Never Smoker  . Smokeless tobacco: Never Used  Vaping Use  . Vaping Use: Never used  Substance and Sexual Activity  . Alcohol use: Yes    Alcohol/week: 2.0 standard drinks    Types: 2 Glasses of wine per week    Comment: Drinks wine 2-3 days per week ( usually a couple of glasses each time)  . Drug use: No  . Sexual activity: Not on file  Other Topics Concern  . Not on file  Social History Narrative  . Not on file  Social Determinants of Health   Financial Resource Strain:   . Difficulty of Paying Living Expenses: Not on file  Food Insecurity:   . Worried About Charity fundraiser in the Last Year: Not on file  . Ran Out of Food in the Last Year: Not on file  Transportation Needs:   . Lack of Transportation (Medical): Not on file  . Lack of Transportation (Non-Medical): Not on file  Physical Activity:   . Days of Exercise per Week: Not on file  . Minutes of  Exercise per Session: Not on file  Stress:   . Feeling of Stress : Not on file  Social Connections:   . Frequency of Communication with Friends and Family: Not on file  . Frequency of Social Gatherings with Friends and Family: Not on file  . Attends Religious Services: Not on file  . Active Member of Clubs or Organizations: Not on file  . Attends Archivist Meetings: Not on file  . Marital Status: Not on file  Intimate Partner Violence:   . Fear of Current or Ex-Partner: Not on file  . Emotionally Abused: Not on file  . Physically Abused: Not on file  . Sexually Abused: Not on file    FAMILY HISTORY:  Family History  Problem Relation Age of Onset  . Thyroid cancer Mother   . Parkinson's disease Sister   . Thyroid disease Brother   . Aneurysm Sister   . Thyroid cancer Other   . Thyroid cancer Other   . Colon cancer Neg Hx     CURRENT MEDICATIONS:  Current Outpatient Medications  Medication Sig Dispense Refill  . Calcium Carb-Cholecalciferol (CALCIUM 600 + D PO) Take 1 tablet by mouth daily.     . cephALEXin (KEFLEX) 250 MG capsule Take 250 mg by mouth daily.    . Cholecalciferol (VITAMIN D3) 250 MCG (10000 UT) TABS Take by mouth.    . Multiple Vitamin (MULTIVITAMIN WITH MINERALS) TABS tablet Take 1 tablet by mouth daily.    . Multiple Vitamins-Minerals (ZINC PO) Take by mouth.    . polyethylene glycol (MIRALAX / GLYCOLAX) packet Take 17 g by mouth daily as needed.    . tamoxifen (NOLVADEX) 20 MG tablet TAKE 1 TABLET BY MOUTH DAILY. 90 tablet 3  . Wheat Dextrin (BENEFIBER) POWD Take by mouth 2 (two) times daily. Takes 1 tbsp twice a day     . Zinc Sulfate (ZINC 15 PO) Take 1 tablet by mouth daily.     Current Facility-Administered Medications  Medication Dose Route Frequency Provider Last Rate Last Admin  . influenza vaccine adjuvanted (FLUAD) injection 0.5 mL  0.5 mL Intramuscular Once Derek Jack, MD        ALLERGIES:  Allergies  Allergen Reactions    . Fish Allergy Nausea And Vomiting  . Shrimp [Shellfish Allergy] Nausea And Vomiting    PHYSICAL EXAM:  Performance status (ECOG): 1 - Symptomatic but completely ambulatory  Vitals:   03/01/20 1456  BP: (!) 135/53  Pulse: 75  Resp: 18  Temp: (!) 97 F (36.1 C)  SpO2: 97%   Wt Readings from Last 3 Encounters:  03/01/20 154 lb 1.6 oz (69.9 kg)  02/25/20 149 lb (67.6 kg)  02/22/20 154 lb 6.4 oz (70 kg)   Physical Exam Vitals reviewed.  Constitutional:      Appearance: Normal appearance.  Neurological:     General: No focal deficit present.     Mental Status: She is alert and oriented  to person, place, and time.  Psychiatric:        Mood and Affect: Mood normal.        Behavior: Behavior normal.      LABORATORY DATA:  I have reviewed the labs as listed.  CBC Latest Ref Rng & Units 02/23/2020 02/16/2020 08/06/2019  WBC 4.0 - 10.5 K/uL 43.8(H) 33.2(H) 8.7  Hemoglobin 12.0 - 15.0 g/dL 12.6 12.2 14.0  Hematocrit 36 - 46 % 39.5 37.3 44.8  Platelets 150 - 400 K/uL 335 317 254   CMP Latest Ref Rng & Units 02/16/2020 08/06/2019 04/06/2019  Glucose 70 - 99 mg/dL 114(H) 109(H) 109(H)  BUN 8 - 23 mg/dL 17 20 18   Creatinine 0.44 - 1.00 mg/dL 0.81 0.77 0.76  Sodium 135 - 145 mmol/L 139 139 141  Potassium 3.5 - 5.1 mmol/L 4.2 4.5 4.7  Chloride 98 - 111 mmol/L 103 103 107  CO2 22 - 32 mmol/L 26 26 25   Calcium 8.9 - 10.3 mg/dL 9.2 9.2 8.8(L)  Total Protein 6.5 - 8.1 g/dL 6.0(L) 6.8 6.5  Total Bilirubin 0.3 - 1.2 mg/dL 0.8 1.0 0.7  Alkaline Phos 38 - 126 U/L 32(L) 35(L) 37(L)  AST 15 - 41 U/L 30 22 24   ALT 0 - 44 U/L 22 18 22    Lab Results  Component Value Date   LDH 567 (H) 02/23/2020   LDH 463 (H) 02/16/2020   LDH 188 08/06/2019    DIAGNOSTIC IMAGING:  I have independently reviewed the scans and discussed with the patient. MM DIAG BREAST TOMO BILATERAL  Result Date: 02/16/2020 CLINICAL DATA:  85 year old female with history of right breast cancer post lumpectomy 11  2019. EXAM: DIGITAL DIAGNOSTIC BILATERAL MAMMOGRAM WITH TOMO AND CAD COMPARISON:  Previous exams. ACR Breast Density Category b: There are scattered areas of fibroglandular density. FINDINGS: No suspicious masses or calcifications are seen in either breast. Lumpectomy changes again identified within the lower inner right breast. Spot compression magnification tangential view of the right breast lumpectomy site was performed. There is no mammographic evidence of locally recurrent malignancy. Mammographic images were processed with CAD. IMPRESSION: Stable appearance of the lumpectomy site in the right breast. No mammographic evidence of malignancy in either breast. RECOMMENDATION: Diagnostic mammogram is suggested in 1 year. (Code:DM-B-01Y) I have discussed the findings and recommendations with the patient. If applicable, a reminder letter will be sent to the patient regarding the next appointment. BI-RADS CATEGORY  2: Benign. Electronically Signed   By: Everlean Alstrom M.D.   On: 02/16/2020 10:54     ASSESSMENT:  1. Right breast DCIS: -Screening mammogram on 02/03/2018 was abnormal. -Ultrasound of the right breast on 02/11/2018 shows mass measuring 0.5 x 0.4 x 0.5 cm, circumscribed hypoechoic in the 3:30 o'clock location of the right breast. Right axilla ultrasound was negative for adenopathy. -Right breast core biopsy on 02/18/2018 shows ductal carcinoma with papillary configuration. In the comment section, it notes that this may represent DCIS involving a papillary lesion or it may represent an intracystic papillary ductal carcinoma. ER/PR was 95% positive. -Right breast lumpectomy on 03/12/2018, pathology showing intermediate grade DCIS, 0.6 cm, with areas showing papillary configuration. Posterior margin was 0.2 cm. No evidence of invasion. -Radiation therapy was refused. -Tamoxifen for 5 years started on 04/09/2018. -Mammogram on 02/16/2020 was BI-RADS Category 2.  2. Osteoporosis: -DEXA  scan on 04/11/2018 shows T score of -2.9. -She has received some shots with Dr. Nevada Crane but could not tolerate them.   PLAN:  1. Right  breast DCIS: -Recent breast exam 1 week ago was normal. -Continue tamoxifen.  2. Osteoporosis: -Vitamin D was 50. Continue calcium and vitamin D.  3.  Leukocytosis: -CBC from 02/23/2020 shows white count 43.8. Hemoglobin and platelet count was normal. Predominantly left shifted with increased neutrophils, absolute eosinophils, basophils, metamyelocytes, myelocytes and promyelocytes. Blasts were reported as 4%. -Reviewed flow cytometry from 02/23/2020 showing less than 1% CD34 positive cells, but there is a population of cells within the blast gate that express CD123, CD13, CD38 and CD11b. They did not express any 117 or CD34. The cells likely represent abnormal early myeloid precursors given the left shift. -Unfortunately [facility] is still pending. We have called [facility]. It will likely reported tomorrow evening or Thursday morning. -We will set up a phone follow-up on Thursday.   Orders placed this encounter:  No orders of the defined types were placed in this encounter.    Derek Jack, MD [facility] [phone]   I, Milinda Antis, am acting as a scribe for Dr. Sanda Linger.  I, Derek Jack MD, have reviewed the above documentation for accuracy and completeness, and I agree with the above.

## 2020-03-03 ENCOUNTER — Inpatient Hospital Stay (HOSPITAL_COMMUNITY): Payer: PPO | Admitting: Hematology

## 2020-03-04 LAB — FLOW CYTOMETRY

## 2020-03-07 LAB — BCR-ABL1 FISH
Cells Analyzed: 100
Cells Counted: 100

## 2020-03-08 ENCOUNTER — Inpatient Hospital Stay (HOSPITAL_COMMUNITY): Payer: PPO | Attending: Hematology | Admitting: Hematology

## 2020-03-08 ENCOUNTER — Encounter (HOSPITAL_COMMUNITY): Payer: Self-pay | Admitting: Hematology

## 2020-03-08 VITALS — BP 141/81 | HR 77 | Temp 96.6°F | Resp 18 | Wt 154.6 lb

## 2020-03-08 DIAGNOSIS — Z79810 Long term (current) use of selective estrogen receptor modulators (SERMs): Secondary | ICD-10-CM | POA: Insufficient documentation

## 2020-03-08 DIAGNOSIS — M810 Age-related osteoporosis without current pathological fracture: Secondary | ICD-10-CM | POA: Insufficient documentation

## 2020-03-08 DIAGNOSIS — C9110 Chronic lymphocytic leukemia of B-cell type not having achieved remission: Secondary | ICD-10-CM | POA: Insufficient documentation

## 2020-03-08 DIAGNOSIS — R110 Nausea: Secondary | ICD-10-CM | POA: Diagnosis not present

## 2020-03-08 DIAGNOSIS — E7800 Pure hypercholesterolemia, unspecified: Secondary | ICD-10-CM | POA: Diagnosis not present

## 2020-03-08 DIAGNOSIS — C9210 Chronic myeloid leukemia, BCR/ABL-positive, not having achieved remission: Secondary | ICD-10-CM | POA: Diagnosis not present

## 2020-03-08 DIAGNOSIS — D0511 Intraductal carcinoma in situ of right breast: Secondary | ICD-10-CM

## 2020-03-08 DIAGNOSIS — Z170 Estrogen receptor positive status [ER+]: Secondary | ICD-10-CM | POA: Diagnosis not present

## 2020-03-08 DIAGNOSIS — M353 Polymyalgia rheumatica: Secondary | ICD-10-CM | POA: Diagnosis not present

## 2020-03-08 MED ORDER — DASATINIB 50 MG PO TABS: 50.0000 mg | ORAL_TABLET | Freq: Every day | ORAL | 0 refills | Status: DC

## 2020-03-08 NOTE — Progress Notes (Signed)
Confirmed patient is taking tamoxifen as directed denies side effects other than hair is thinning.

## 2020-03-08 NOTE — Patient Instructions (Signed)
[place] at [facility] Discharge Instructions  You were seen today by Dr. Delton Coombes. He went over your recent results; your blood work is positive for chronic myeloid leukemia (CML). You will be started on Sprycel which is done through the pharmacy at [facility]. You will be scheduled for a bone marrow biopsy to get a baseline on your CML. You may get your COVID booster shot this week. Dr. Delton Coombes will see you back after the biopsy for labs and follow up.   Thank you for choosing [place] at [facility] to provide your oncology and hematology care.  To afford each patient quality time with our provider, please arrive at least 15 minutes before your scheduled appointment time.   If you have a lab appointment with the [facility] please come in thru the Main Entrance and check in at the main information desk  You need to re-schedule your appointment should you arrive 10 or more minutes late.  We strive to give you quality time with our providers, and arriving late affects you and other patients whose appointments are after yours.  Also, if you no show three or more times for appointments you may be dismissed from the clinic at the providers discretion.     Again, thank you for choosing [facility].  Our hope is that these requests will decrease the amount of time that you wait before being seen by our physicians.       _____________________________________________________________  Should you have questions after your visit to [facility], please contact our office at [phone] between the hours of 8:00 a.m. and 4:30 p.m.  Voicemails left after 4:00 p.m. will not be returned until the following business day.  For prescription refill requests, have your pharmacy contact our office and allow 72 hours.    Cancer Center Support Programs:   > Cancer Support Group  2nd Tuesday of the month 1pm-2pm, Journey Room

## 2020-03-08 NOTE — Progress Notes (Signed)
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, a 85 y.o. female, returns for routine follow-up for her CML and right breast DCIS. Etana was last seen on 03/01/2020.  Today she is accompanied by her granddaughter. She reports feeling well. She denies having any cardiac issues and denies having GERD. She denies having any new pains or changes since her last visit.  She got her [place] shot on 07/15/2019 and is inquiring about getting her COVID booster shot.Marland Kitchen   REVIEW OF SYSTEMS:  Review of Systems  Constitutional: Positive for appetite change (70%) and fatigue (75%).  Gastrointestinal: Positive for nausea (occasional).  Genitourinary: Positive for difficulty urinating (recurrent UTI's).   Neurological: Positive for dizziness and numbness (hands).  All other systems reviewed and are negative.   PAST MEDICAL/SURGICAL HISTORY:  Past Medical History:  Diagnosis Date  . Bladder infection, chronic   . Cancer ([place]) 02/2018   right breast cancer  . Complication of anesthesia   . GERD (gastroesophageal reflux disease)   . Hemorrhoids   . Hypercholesterolemia   . Polymyalgia rheumatica ([place])   . PONV (postoperative nausea and vomiting)   . S/P colonoscopy Jun 14, 2004   friable internal and external hemorrhoids, left-sided diverticula  . S/P endoscopy August 13, 2006   pale 1-3 mm nodules consistent with benign squamous papilloma, tiny hiatal hernia  . Serrated adenoma of colon    Past Surgical History:  Procedure Laterality Date  . ABDOMINAL HYSTERECTOMY    . BREAST LUMPECTOMY WITH RADIOACTIVE SEED LOCALIZATION Right 03/12/2018   Procedure: RIGHT BREAST LUMPECTOMY WITH  RADIOACTIVE SEED LOCALIZATION;  Surgeon: Fanny Skates, MD;  Location: [facility];  Service: General;  Laterality: Right;  . CHOLECYSTECTOMY    . COLONOSCOPY  11/20/2010   Dr. Gala Romney- serrated adenomaL side diverticulosis, hemorrhoids  . COLONOSCOPY  06/14/04   friable internal and external hemorrhoids, left-sided diverticula  . COLONOSCOPY N/A 08/23/2016   Dr. Gala Romney: Diverticulosis, medium-sized grade 2 internal hemorrhoids.  . ESOPHAGOGASTRODUODENOSCOPY  08/13/06   pale 1-3 mm nodules consistent with benign squamous papilloma, tiny hiatal hernia  . ESOPHAGOGASTRODUODENOSCOPY N/A 08/23/2016   Dr. Gala Romney: Normal  . FLEXIBLE SIGMOIDOSCOPY  11/15/2011   Procedure: FLEXIBLE SIGMOIDOSCOPY;  Surgeon: Daneil Dolin, MD;  [facility];  Service: Endoscopy;  Laterality: N/A;  12:30PM  . WRIST FRACTURE SURGERY Right     SOCIAL HISTORY:  Social History   Socioeconomic History  . Marital status: Widowed    Spouse name: Not on file  . Number of children: 2  . Years of education: Not on file  . Highest education level: Not on file  Occupational History  . Not on file  Tobacco Use  . Smoking status: Never Smoker  . Smokeless tobacco: Never Used  Vaping Use  . Vaping Use: Never used  Substance and Sexual Activity  . Alcohol use: Yes    Alcohol/week: 2.0 standard drinks    Types: 2 Glasses of wine per week    Comment: Drinks wine 2-3 days per week ( usually a couple of glasses each time)  . Drug use: No  . Sexual activity: Not on file  Other Topics Concern  . Not on file  Social History Narrative  . Not on file   Social Determinants of Health   Financial Resource Strain: Low Risk   . Difficulty of Paying Living Expenses: Not hard at all  Food Insecurity: No Food Insecurity  . Worried About Charity fundraiser in the Last Year: Never true  . Ran Out of Food in the Last Year: Never true  Transportation Needs: No Transportation Needs  . Lack of Transportation  (Medical): No  . Lack of Transportation (Non-Medical): No  Physical Activity: Insufficiently Active  . Days of Exercise per Week: 7 days  . Minutes of Exercise per Session: 20 min  Stress: No Stress Concern Present  . Feeling of Stress : Not at all  Social Connections: Moderately Isolated  . Frequency of Communication with Friends and Family: Twice a week  . Frequency of Social Gatherings with Friends and Family: Twice a week  . Attends Religious Services: More than 4 times per year  . Active Member of Clubs or Organizations: No  . Attends Archivist Meetings: Never  . Marital Status: Widowed  Intimate Partner Violence: Not At Risk  . Fear of Current or Ex-Partner: No  . Emotionally Abused: No  . Physically Abused: No  . Sexually Abused: No    FAMILY HISTORY:  Family History  Problem Relation Age of Onset  . Thyroid cancer Mother   . Parkinson's disease Sister   . Thyroid disease Brother   . Aneurysm Sister   . Thyroid cancer Other   . Thyroid cancer Other   . Colon cancer Neg Hx     CURRENT MEDICATIONS:  Current Outpatient Medications  Medication Sig Dispense Refill  . Calcium Carb-Cholecalciferol (CALCIUM 600 + D PO) Take 1 tablet by mouth daily.     . cephALEXin (KEFLEX) 250 MG capsule Take 250 mg by mouth daily.    . Cholecalciferol (VITAMIN D3) 250 MCG (10000 UT) TABS Take by mouth.    . magnesium 30 MG tablet Take 500 mg by mouth daily.    . Multiple Vitamin (MULTIVITAMIN WITH MINERALS) TABS tablet Take 1 tablet by mouth daily.    . Multiple Vitamins-Minerals (ZINC PO) Take by mouth.    . polyethylene glycol (MIRALAX / GLYCOLAX) packet Take 17 g by mouth daily as needed.     . tamoxifen (NOLVADEX) 20 MG tablet TAKE 1 TABLET BY MOUTH DAILY. 90 tablet 3  . Wheat Dextrin (BENEFIBER) POWD Take by mouth 2 (two) times daily. Takes 1 tbsp twice a day     . Zinc Sulfate (ZINC 15 PO) Take 1 tablet by mouth daily.     No current facility-administered medications  for this visit.    ALLERGIES:  Allergies  Allergen Reactions  . Fish Allergy Nausea And Vomiting  . Shrimp [Shellfish Allergy] Nausea And Vomiting    PHYSICAL EXAM:  Performance status (ECOG): 1 - Symptomatic but completely ambulatory  Vitals:   03/08/20 1614  BP: (!) 141/81  Pulse: 77  Resp: 18  Temp: (!) 96.6 F (35.9 C)  SpO2: 99%   Wt Readings from Last 3 Encounters:  03/08/20 154 lb 9.6 oz (70.1 kg)  03/01/20 154 lb 1.6 oz (69.9 kg)  02/25/20 149 lb (67.6 kg)   Physical Exam Vitals reviewed.  Constitutional:      Appearance: Normal appearance.  Neurological:     General: No focal deficit present.     Mental Status: She is alert and  oriented to person, place, and time.  Psychiatric:        Mood and Affect: Mood normal.        Behavior: Behavior normal.     LABORATORY DATA:  I have reviewed the labs as listed.  CBC Latest Ref Rng & Units 02/23/2020 02/16/2020 08/06/2019  WBC 4.0 - 10.5 K/uL 43.8(H) 33.2(H) 8.7  Hemoglobin 12.0 - 15.0 g/dL 12.6 12.2 14.0  Hematocrit 36 - 46 % 39.5 37.3 44.8  Platelets 150 - 400 K/uL 335 317 254   CMP Latest Ref Rng & Units 02/16/2020 08/06/2019 04/06/2019  Glucose 70 - 99 mg/dL 114(H) 109(H) 109(H)  BUN 8 - 23 mg/dL 17 20 18   Creatinine 0.44 - 1.00 mg/dL 0.81 0.77 0.76  Sodium 135 - 145 mmol/L 139 139 141  Potassium 3.5 - 5.1 mmol/L 4.2 4.5 4.7  Chloride 98 - 111 mmol/L 103 103 107  CO2 22 - 32 mmol/L 26 26 25   Calcium 8.9 - 10.3 mg/dL 9.2 9.2 8.8(L)  Total Protein 6.5 - 8.1 g/dL 6.0(L) 6.8 6.5  Total Bilirubin 0.3 - 1.2 mg/dL 0.8 1.0 0.7  Alkaline Phos 38 - 126 U/L 32(L) 35(L) 37(L)  AST 15 - 41 U/L 30 22 24   ALT 0 - 44 U/L 22 18 22       Component Value Date/Time   RBC 4.09 02/23/2020 0853   MCV 96.6 02/23/2020 0853   MCV 93 09/13/2013 1842   MCH 30.8 02/23/2020 0853   MCHC 31.9 02/23/2020 0853   RDW 15.2 02/23/2020 0853   RDW 14.1 09/13/2013 1842   LYMPHSABS 3.5 02/23/2020 0853   LYMPHSABS 0.8 (L) 09/13/2013  1842   MONOABS 0.0 (L) 02/23/2020 0853   MONOABS 0.4 09/13/2013 1842   EOSABS 1.3 (H) 02/23/2020 0853   EOSABS 0.0 09/13/2013 1842   BASOSABS 3.1 (H) 02/23/2020 0853   BASOSABS 0.0 09/13/2013 1842    DIAGNOSTIC IMAGING:  I have independently reviewed the scans and discussed with the patient. MM DIAG BREAST TOMO BILATERAL  Result Date: 02/16/2020 CLINICAL DATA:  85 year old female with history of right breast cancer post lumpectomy 11 2019. EXAM: DIGITAL DIAGNOSTIC BILATERAL MAMMOGRAM WITH TOMO AND CAD COMPARISON:  Previous exams. ACR Breast Density Category b: There are scattered areas of fibroglandular density. FINDINGS: No suspicious masses or calcifications are seen in either breast. Lumpectomy changes again identified within the lower inner right breast. Spot compression magnification tangential view of the right breast lumpectomy site was performed. There is no mammographic evidence of locally recurrent malignancy. Mammographic images were processed with CAD. IMPRESSION: Stable appearance of the lumpectomy site in the right breast. No mammographic evidence of malignancy in either breast. RECOMMENDATION: Diagnostic mammogram is suggested in 1 year. (Code:DM-B-01Y) I have discussed the findings and recommendations with the patient. If applicable, a reminder letter will be sent to the patient regarding the next appointment. BI-RADS CATEGORY  2: Benign. Electronically Signed   By: Everlean Alstrom M.D.   On: 02/16/2020 10:54     ASSESSMENT:  1.  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  CBC on 02/23/2020 with WBC 43.8, hemoglobin and platelet count normal. -Flow cytometry on 02/23/2020 shows less than 1% CD34 positive cells, but there is population of cells within the blast gate that express CD123, CD13, CD38 and CD11b.  They did not express any CD117 or CD 34. -BCR/ABL FISH on 02/23/2020 shows 77% nuclei positive for gene fusion. -She does not have any B symptoms.  2.  Right breast DCIS: -Screening mammogram on 02/03/2018 was abnormal. -Ultrasound of the right breast on 02/11/2018 shows mass measuring 0.5 x 0.4 x 0.5 cm, circumscribed hypoechoic in the 3:30 o'clock location of the right breast. Right axilla ultrasound was negative for adenopathy. -Right breast core biopsy on 02/18/2018 shows ductal carcinoma with papillary configuration. In the comment section, it notes that this may represent DCIS involving a papillary lesion or it may represent an intracystic papillary ductal carcinoma. ER/PR was 95% positive. -Right breast lumpectomy on 03/12/2018, pathology showing intermediate grade DCIS, 0.6 cm, with areas showing papillary configuration. Posterior margin was 0.2 cm. No evidence of invasion. -Radiation therapy was refused. -Tamoxifen for 5 years started on 04/09/2018. -Mammogram on 02/16/2020 was BI-RADS Category 2.  3.  Osteoporosis: -DEXA scan on 04/11/2018 with T score of -2.9. -She is following up with Dr. Nevada Crane for further management.   PLAN:  1.  CML in chronic phase: -We discussed the diagnosis, prognosis and treatment plan for CML in detail. -Recommend bone marrow aspirate and biopsy for morphologic review and cytogenetic evaluation. -Recommend quantitative RT-PCR for BCR/ABL.  We will also check uric acid, hepatitis B panel, LDH and routine labs. -Recommended second-generation TKI.  We talked about side effects of dasatinib in detail. -We will start at 50 mg dose of dasatinib daily and titrate up as needed.  2.  Right breast DCIS: -Continue tamoxifen.  3.  Osteoporosis: -Vitamin D levels were normal.  Continue calcium and vitamin D.    Orders placed this encounter:  No orders of the defined types were placed in this encounter.  Total time spent is 40 minutes with more than 50% of the time spent face-to-face discussing new diagnosis, treatment plan, counseling and coordination of care.  Derek Jack, MD [place] [phone]   I, Milinda Antis, am acting as a scribe for Dr. Sanda Linger.  I, Derek Jack MD, have reviewed the above documentation for accuracy and completeness, and I agree with the above.

## 2020-03-09 ENCOUNTER — Telehealth: Payer: Self-pay | Admitting: Pharmacist

## 2020-03-09 ENCOUNTER — Telehealth (HOSPITAL_COMMUNITY): Payer: Self-pay | Admitting: Pharmacy Technician

## 2020-03-09 NOTE — Telephone Encounter (Signed)
Oral Oncology Patient Advocate Encounter   Received notification from [employer] that prior authorization for Sprycel is required.   PA submitted on CoverMyMeds Key [id number] Status is pending   Oral Oncology Clinic will continue to follow.   [place] Patient [place] Phone [phone] Fax [phone] 03/09/2020 12:09 PM

## 2020-03-09 NOTE — Telephone Encounter (Signed)
Oral Oncology Pharmacist Encounter  Received new prescription for dasatinib (Sprycel) for the treatment of BCR-ABL1+ CML, planned duration until disease progression or unacceptable drug toxicity.  Labs from 02/23/2020 assessed, all wnl. Prescription dose and frequency assessed. MD plans to start at 50 mg once daily and increase to 100 mg daily as tolerated.  Current medication list in Epic reviewed, a DDI with calcium carbonate/vitamin D supplement identified: calcium carbonate may decrease the serum concentration of dasatinib, recommendations for management include separating calcium carbonate and dasatinib by 2 hours.   Prescription has been e-scribed to the [facility] for benefits analysis and approval.  Oral Oncology Clinic will continue to follow for insurance authorization, copayment issues, initial counseling and start date.  Eddie Candle, PharmD PGY2 Hematology/Oncology Pharmacy Resident Oral Chemotherapy Navigation Clinic 03/09/2020 8:58 AM

## 2020-03-10 ENCOUNTER — Encounter (HOSPITAL_COMMUNITY): Payer: Self-pay

## 2020-03-10 ENCOUNTER — Other Ambulatory Visit (HOSPITAL_COMMUNITY): Payer: Self-pay

## 2020-03-10 ENCOUNTER — Inpatient Hospital Stay (HOSPITAL_COMMUNITY): Payer: PPO | Attending: Physical Medicine and Rehabilitation | Admitting: Hematology

## 2020-03-10 DIAGNOSIS — Z170 Estrogen receptor positive status [ER+]: Secondary | ICD-10-CM | POA: Insufficient documentation

## 2020-03-10 DIAGNOSIS — M353 Polymyalgia rheumatica: Secondary | ICD-10-CM | POA: Insufficient documentation

## 2020-03-10 DIAGNOSIS — C9210 Chronic myeloid leukemia, BCR/ABL-positive, not having achieved remission: Secondary | ICD-10-CM

## 2020-03-10 DIAGNOSIS — K219 Gastro-esophageal reflux disease without esophagitis: Secondary | ICD-10-CM | POA: Diagnosis not present

## 2020-03-10 DIAGNOSIS — E790 Hyperuricemia without signs of inflammatory arthritis and tophaceous disease: Secondary | ICD-10-CM | POA: Insufficient documentation

## 2020-03-10 DIAGNOSIS — Z79810 Long term (current) use of selective estrogen receptor modulators (SERMs): Secondary | ICD-10-CM | POA: Insufficient documentation

## 2020-03-10 DIAGNOSIS — D0511 Intraductal carcinoma in situ of right breast: Secondary | ICD-10-CM | POA: Insufficient documentation

## 2020-03-10 DIAGNOSIS — Z79899 Other long term (current) drug therapy: Secondary | ICD-10-CM | POA: Diagnosis not present

## 2020-03-10 DIAGNOSIS — Z809 Family history of malignant neoplasm, unspecified: Secondary | ICD-10-CM | POA: Diagnosis not present

## 2020-03-10 DIAGNOSIS — Z86010 Personal history of colonic polyps: Secondary | ICD-10-CM | POA: Insufficient documentation

## 2020-03-10 DIAGNOSIS — E7800 Pure hypercholesterolemia, unspecified: Secondary | ICD-10-CM | POA: Diagnosis not present

## 2020-03-10 LAB — CBC WITH DIFFERENTIAL/PLATELET
Band Neutrophils: 19 %
Basophils Absolute: 2 10*3/uL — ABNORMAL HIGH (ref 0.0–0.1)
Basophils Relative: 3 %
Blasts: 5 %
Eosinophils Absolute: 0 10*3/uL (ref 0.0–0.5)
Eosinophils Relative: 0 %
HCT: 39.3 % (ref 36.0–46.0)
Hemoglobin: 12.3 g/dL (ref 12.0–15.0)
Lymphocytes Relative: 16 %
Lymphs Abs: 10.5 10*3/uL — ABNORMAL HIGH (ref 0.7–4.0)
MCH: 30.8 pg (ref 26.0–34.0)
MCHC: 31.3 g/dL (ref 30.0–36.0)
MCV: 98.3 fL (ref 80.0–100.0)
Metamyelocytes Relative: 3 %
Monocytes Absolute: 0 10*3/uL — ABNORMAL LOW (ref 0.1–1.0)
Monocytes Relative: 0 %
Myelocytes: 2 %
Neutro Abs: 38.6 10*3/uL — ABNORMAL HIGH (ref 1.7–7.7)
Neutrophils Relative %: 40 %
Platelets: 343 10*3/uL (ref 150–400)
Promyelocytes Relative: 12 %
RBC: 4 MIL/uL (ref 3.87–5.11)
RDW: 15.4 % (ref 11.5–15.5)
WBC: 65.5 10*3/uL (ref 4.0–10.5)
nRBC: 0.3 % — ABNORMAL HIGH (ref 0.0–0.2)

## 2020-03-10 LAB — URIC ACID: Uric Acid, Serum: 7.8 mg/dL — ABNORMAL HIGH (ref 2.5–7.1)

## 2020-03-10 LAB — HEPATITIS B CORE ANTIBODY, TOTAL: Hep B Core Total Ab: NONREACTIVE

## 2020-03-10 LAB — HEPATITIS B SURFACE ANTIGEN: Hepatitis B Surface Ag: NONREACTIVE

## 2020-03-10 LAB — HEPATITIS B SURFACE ANTIBODY,QUALITATIVE: Hep B S Ab: NONREACTIVE

## 2020-03-10 MED ORDER — ALLOPURINOL 300 MG PO TABS: 300.0000 mg | ORAL_TABLET | Freq: Every day | ORAL | 1 refills | Status: DC

## 2020-03-10 NOTE — Progress Notes (Signed)
INDICATION: Chronic myeloid leukemia   Bone Marrow Biopsy and Aspiration Procedure Note   The patient was identified by name and date of birth, prior to start of the procedure and a timeout was performed.   An informed consent was obtained after discussing potential risks including bleeding, infection and pain.  The right posterior iliac crest was palpated, cleaned with ChloraPrep, and drapes applied.  1% lidocaine is infiltrated into the skin, subcutaneous tissue and periosteum.  Bone marrow was aspirated and smears made.  With the help of Jamshidi needle a core biopsy was obtained.  Pressure was applied to the biopsy site and bandage was placed over the biopsy site. Patient was made to lie on the back for 15 mins prior to discharge.  The procedure was tolerated well. COMPLICATIONS: None BLOOD LOSS: none Patient was discharged home in stable condition to return in 2 weeks to review results.  Patient was provided with post bone marrow biopsy instructions and instructed to call if there was any bleeding or worsening pain.  Specimens sent for flow cytometry, cytogenetics and additional studies.  Signed Ana Liaw, MD   

## 2020-03-10 NOTE — Progress Notes (Signed)
Patient here today for bone marrow biopsy. Procedure explained and consent signed by all parties at 0755. Patient placed in prone position with both arms above head 0757. Time out conducted at [place] all parties agreed. Procedure started at 0803. Patient tolerated procedure well with minimal pain and discomfort. Specimens collected and labeled appropriately. Procedure completed at [place]. Dressing applied and patient reposition on back, sitting up, resting at 0813. Specimens taken to lab for processing. Patient stable and discharged home 0845.

## 2020-03-10 NOTE — Progress Notes (Signed)
[name], [place] [address]   CLINIC:  Medical Oncology/Hematology  PCP:  Celene Squibb, MD [place] [address] [phone]   REASON FOR VISIT:  Follow-up for CML   CURRENT THERAPY: To start dasatinib 50 mg daily  INTERVAL HISTORY:  [name] 85 y.o. female seen for follow-up of CML in chronic phase.  She was evaluated for further work-up of elevated white count.  Flow cytometry was negative.  She also has a history of right breast DCIS for which she takes tamoxifen and is tolerating well.  She is very active and is independent of ADLs and IADLs.  She does work around the house also.    REVIEW OF SYSTEMS:  Review of Systems  All other systems reviewed and are negative.    PAST MEDICAL/SURGICAL HISTORY:  Past Medical History:  Diagnosis Date  . Bladder infection, chronic   . Cancer ([place]) 02/2018   right breast cancer  . Complication of anesthesia   . GERD (gastroesophageal reflux disease)   . Hemorrhoids   . Hypercholesterolemia   . Polymyalgia rheumatica ([place])   . PONV (postoperative nausea and vomiting)   . S/P colonoscopy Jun 14, 2004   friable internal and external hemorrhoids, left-sided diverticula  . S/P endoscopy August 13, 2006   pale 1-3 mm nodules consistent with benign squamous papilloma, tiny hiatal hernia  . Serrated adenoma of colon    Past Surgical History:  Procedure Laterality Date  . ABDOMINAL HYSTERECTOMY    . BREAST LUMPECTOMY WITH RADIOACTIVE SEED LOCALIZATION Right 03/12/2018   Procedure: RIGHT BREAST LUMPECTOMY WITH RADIOACTIVE SEED LOCALIZATION;  Surgeon: Fanny Skates, MD;  Location: [facility];  Service: General;  Laterality: Right;  . CHOLECYSTECTOMY    . COLONOSCOPY  11/20/2010   Dr. Gala Romney- serrated adenomaL side diverticulosis, hemorrhoids  . COLONOSCOPY  06/14/04   friable internal and external hemorrhoids, left-sided diverticula  . COLONOSCOPY N/A 08/23/2016    Dr. Gala Romney: Diverticulosis, medium-sized grade 2 internal hemorrhoids.  . ESOPHAGOGASTRODUODENOSCOPY  08/13/06   pale 1-3 mm nodules consistent with benign squamous papilloma, tiny hiatal hernia  . ESOPHAGOGASTRODUODENOSCOPY N/A 08/23/2016   Dr. Gala Romney: Normal  . FLEXIBLE SIGMOIDOSCOPY  11/15/2011   Procedure: FLEXIBLE SIGMOIDOSCOPY;  Surgeon: Daneil Dolin, MD;  [facility];  Service: Endoscopy;  Laterality: N/A;  12:30PM  . WRIST FRACTURE SURGERY Right      SOCIAL HISTORY:  Social History   Socioeconomic History  . Marital status: Widowed    Spouse name: Not on file  . Number of children: 2  . Years of education: Not on file  . Highest education level: Not on file  Occupational History  . Not on file  Tobacco Use  . Smoking status: Never Smoker  . Smokeless tobacco: Never Used  Vaping Use  . Vaping Use: Never used  Substance and Sexual Activity  . Alcohol use: Yes    Alcohol/week: 2.0 standard drinks    Types: 2 Glasses of wine per week    Comment: Drinks wine 2-3 days per week ( usually a couple of glasses each time)  . Drug use: No  . Sexual activity: Not on file  Other Topics Concern  . Not on file  Social History Narrative  . Not on file   Social Determinants of Health   Financial Resource Strain: Low Risk   . Difficulty of Paying Living Expenses: Not hard at all  Food  Insecurity: No Food Insecurity  . Worried About Charity fundraiser in the Last Year: Never true  . Ran Out of Food in the Last Year: Never true  Transportation Needs: No Transportation Needs  . Lack of Transportation (Medical): No  . Lack of Transportation (Non-Medical): No  Physical Activity: Insufficiently Active  . Days of Exercise per Week: 7 days  . Minutes of Exercise per Session: 20 min  Stress: No Stress Concern Present  . Feeling of Stress : Not at all  Social Connections: Moderately Isolated  . Frequency of Communication with Friends and Family: Twice a week  . Frequency  of Social Gatherings with Friends and Family: Twice a week  . Attends Religious Services: More than 4 times per year  . Active Member of Clubs or Organizations: No  . Attends Archivist Meetings: Never  . Marital Status: Widowed  Intimate Partner Violence: Not At Risk  . Fear of Current or Ex-Partner: No  . Emotionally Abused: No  . Physically Abused: No  . Sexually Abused: No    FAMILY HISTORY:  Family History  Problem Relation Age of Onset  . Thyroid cancer Mother   . Parkinson's disease Sister   . Thyroid disease Brother   . Aneurysm Sister   . Thyroid cancer Other   . Thyroid cancer Other   . Colon cancer Neg Hx     CURRENT MEDICATIONS:  Outpatient Encounter Medications as of 03/10/2020  Medication Sig  . allopurinol (ZYLOPRIM) 300 MG tablet Take 1 tablet (300 mg total) by mouth daily.  . Calcium Carb-Cholecalciferol (CALCIUM 600 + D PO) Take 1 tablet by mouth daily.   . cephALEXin (KEFLEX) 250 MG capsule Take 250 mg by mouth daily.  . Cholecalciferol (VITAMIN D3) 250 MCG (10000 UT) TABS Take by mouth.  . dasatinib (SPRYCEL) 50 MG tablet Take 1 tablet (50 mg total) by mouth daily.  . magnesium 30 MG tablet Take 500 mg by mouth daily.  . Multiple Vitamin (MULTIVITAMIN WITH MINERALS) TABS tablet Take 1 tablet by mouth daily.  . Multiple Vitamins-Minerals (ZINC PO) Take by mouth.  . polyethylene glycol (MIRALAX / GLYCOLAX) packet Take 17 g by mouth daily as needed.   . tamoxifen (NOLVADEX) 20 MG tablet TAKE 1 TABLET BY MOUTH DAILY.  [place] Wheat Dextrin (BENEFIBER) POWD Take by mouth 2 (two) times daily. Takes 1 tbsp twice a day   . Zinc Sulfate (ZINC 15 PO) Take 1 tablet by mouth daily.   No facility-administered encounter medications on file as of 03/10/2020.    ALLERGIES:  Allergies  Allergen Reactions  . Fish Allergy Nausea And Vomiting  . Shrimp [Shellfish Allergy] Nausea And Vomiting     PHYSICAL EXAM:  ECOG Performance status: 1  Vitals:   03/10/20  0733 03/10/20 0839  BP: (!) 133/49 (!) 129/49  Pulse: 79 72  Resp: 16 16  SpO2: 98% 98%   There were no vitals filed for this visit. Physical Exam Vitals reviewed.  Constitutional:      Appearance: Normal appearance.  Neurological:     Mental Status: She is alert and oriented to person, place, and time.  Psychiatric:        Mood and Affect: Mood normal.        Behavior: Behavior normal.      LABORATORY DATA:  I have reviewed the labs as listed.  CBC    Component Value Date/Time   WBC 65.5 (HH) 03/10/2020 0752   RBC 4.00 03/10/2020  0752   HGB 12.3 03/10/2020 0752   HGB 13.8 09/13/2013 1842   HCT 39.3 03/10/2020 0752   HCT 41.3 09/13/2013 1842   PLT 343 03/10/2020 0752   PLT 231 09/13/2013 1842   MCV 98.3 03/10/2020 0752   MCV 93 09/13/2013 1842   MCH 30.8 03/10/2020 0752   MCHC 31.3 03/10/2020 0752   RDW 15.4 03/10/2020 0752   RDW 14.1 09/13/2013 1842   LYMPHSABS 10.5 (H) 03/10/2020 0752   LYMPHSABS 0.8 (L) 09/13/2013 1842   MONOABS 0.0 (L) 03/10/2020 0752   MONOABS 0.4 09/13/2013 1842   EOSABS 0.0 03/10/2020 0752   EOSABS 0.0 09/13/2013 1842   BASOSABS 2.0 (H) 03/10/2020 0752   BASOSABS 0.0 09/13/2013 1842   CMP Latest Ref Rng & Units 02/16/2020 08/06/2019 04/06/2019  Glucose 70 - 99 mg/dL 114(H) 109(H) 109(H)  BUN 8 - 23 mg/dL 17 20 18   Creatinine 0.44 - 1.00 mg/dL 0.81 0.77 0.76  Sodium 135 - 145 mmol/L 139 139 141  Potassium 3.5 - 5.1 mmol/L 4.2 4.5 4.7  Chloride 98 - 111 mmol/L 103 103 107  CO2 22 - 32 mmol/L 26 26 25   Calcium 8.9 - 10.3 mg/dL 9.2 9.2 8.8(L)  Total Protein 6.5 - 8.1 g/dL 6.0(L) 6.8 6.5  Total Bilirubin 0.3 - 1.2 mg/dL 0.8 1.0 0.7  Alkaline Phos 38 - 126 U/L 32(L) 35(L) 37(L)  AST 15 - 41 U/L 30 22 24   ALT 0 - 44 U/L 22 18 22     DIAGNOSTIC IMAGING:  I have independently reviewed the scans and discussed with the patient.  ASSESSMENT & PLAN:  1.  CML in chronic phase: -Based on her BCR/ABL by FISH positivity, I have recommended  further work-up with bone marrow aspiration and biopsy for morphological review and cytogenetic evaluation. -We will also do blood work for BCR/ABL by quantitative PCR.  We will also check hepatitis B panel. -We have sent a prescription for dasatinib 50 mg daily. -She will start dasatinib as soon as she receives the bottle.  2.  Hyperuricemia: -Uric acid is elevated at 7.8. -We will start her on allopurinol 300 mg daily.     Orders placed this encounter:  No orders of the defined types were placed in this encounter.     Derek Jack, MD [place] [phone]

## 2020-03-10 NOTE — Progress Notes (Signed)
Patient aware of new prescription for Allopurinol 300mg  Daily as prescribed by Dr. Delton Coombes. Patient verbalized understanding.

## 2020-03-10 NOTE — Progress Notes (Signed)
CRITICAL VALUE ALERT  Critical Value:  WBC 65.5  Date & Time Notied:  03/10/2020 at 1014  Provider Notified: Dr. Delton Coombes  Orders Received/Actions taken: n/a - BMBx done this A.M.

## 2020-03-11 ENCOUNTER — Telehealth: Payer: Self-pay | Admitting: Pharmacist

## 2020-03-11 LAB — SURGICAL PATHOLOGY

## 2020-03-11 MED FILL — SPRYCEL 50 MG TABLET: 50 | 30 days supply | Qty: 30 | Fill #0

## 2020-03-11 NOTE — Telephone Encounter (Signed)
Oral Oncology Patient Advocate Encounter  Prior Authorization for Sprycel has been approved.    PA# [id number] Effective dates: 03/10/20 through 03/10/21  Patients co-pay is $2367.47.  Oral Oncology Clinic will continue to follow.   [place] Patient [place] Phone [phone] Fax [phone] 03/11/2020 1:17 PM

## 2020-03-14 ENCOUNTER — Telehealth (HOSPITAL_COMMUNITY): Payer: Self-pay | Admitting: Pharmacy Technician

## 2020-03-14 NOTE — Telephone Encounter (Signed)
Oral Oncology Patient Advocate Encounter  I spoke to Mrs. Gladish on Friday, 03/11/20, to help her sign up for a free trial of Sprycel through [employer].  We spoke with a [employer] rep through a conference call and was able to obtain the pharmacy billing information for a 30 day trial of Sprycel.  Billing information is as follows and has been shared with [facility]:  [place]: [id number] PCN: [id number] ID: [id number] GROUP: [id number]   Sprycel was filled on 03/11/20 and patient picked up the medication on 03/12/20.  [place] Patient [place] Phone [phone] Fax [phone] 03/14/2020 11:35 AM

## 2020-03-15 ENCOUNTER — Encounter (HOSPITAL_COMMUNITY): Payer: PPO | Admitting: Hematology

## 2020-03-17 ENCOUNTER — Encounter (HOSPITAL_COMMUNITY): Payer: Self-pay | Admitting: Hematology

## 2020-03-17 ENCOUNTER — Encounter (HOSPITAL_COMMUNITY): Payer: Self-pay | Admitting: *Deleted

## 2020-03-18 LAB — BCR-ABL1, CML/ALL, PCR, QUANT
E1A2 Transcript: 0.0296 %
Interpretation (BCRAL):: POSITIVE

## 2020-03-22 NOTE — Telephone Encounter (Signed)
Oral Chemotherapy Pharmacist Encounter  Patient Education I spoke with patient on 03/11/20 for overview of new oral chemotherapy medication: dasatinib (Sprycel) for the treatment of BCR-ABL1+ CML, planned duration until disease progression or unacceptable drug toxicity.   Counseled patient on administration, dosing, side effects, monitoring, drug-food interactions, safe handling, storage, and disposal. Patient will take 1 tablet (50 mg total) by mouth daily.  Side effects include but not limited to: decreased wbc/plt/hgb, HA, diarrhea, and edema.    Reviewed with patient importance of keeping a medication schedule and plan for any missed doses.  After discussion with patient no patient barriers to medication adherence identified.   Ms. Weinel voiced understanding and appreciation. All questions answered. Medication handout provided.  Provided patient with [facility] phone number. Patient knows to call the office with questions or concerns. Oral Chemotherapy Navigation Clinic will continue to follow.  Darl Pikes, PharmD, BCPS, BCOP, CPP Hematology/Oncology Clinical Pharmacist Practitioner [facility] [phone]

## 2020-03-23 ENCOUNTER — Inpatient Hospital Stay (HOSPITAL_COMMUNITY): Payer: PPO

## 2020-03-23 ENCOUNTER — Other Ambulatory Visit: Payer: Self-pay

## 2020-03-23 ENCOUNTER — Inpatient Hospital Stay (HOSPITAL_COMMUNITY): Payer: PPO | Admitting: Hematology

## 2020-03-23 VITALS — BP 142/46 | HR 79 | Temp 97.5°F | Resp 18 | Wt 152.8 lb

## 2020-03-23 DIAGNOSIS — C9210 Chronic myeloid leukemia, BCR/ABL-positive, not having achieved remission: Secondary | ICD-10-CM

## 2020-03-23 DIAGNOSIS — C9110 Chronic lymphocytic leukemia of B-cell type not having achieved remission: Secondary | ICD-10-CM | POA: Diagnosis not present

## 2020-03-23 LAB — CBC WITH DIFFERENTIAL/PLATELET
Abs Immature Granulocytes: 1.12 10*3/uL — ABNORMAL HIGH (ref 0.00–0.07)
Basophils Absolute: 0.5 10*3/uL — ABNORMAL HIGH (ref 0.0–0.1)
Basophils Relative: 4 %
Eosinophils Absolute: 0.4 10*3/uL (ref 0.0–0.5)
Eosinophils Relative: 3 %
HCT: 33.1 % — ABNORMAL LOW (ref 36.0–46.0)
Hemoglobin: 10.6 g/dL — ABNORMAL LOW (ref 12.0–15.0)
Immature Granulocytes: 10 %
Lymphocytes Relative: 14 %
Lymphs Abs: 1.7 10*3/uL (ref 0.7–4.0)
MCH: 31.1 pg (ref 26.0–34.0)
MCHC: 32 g/dL (ref 30.0–36.0)
MCV: 97.1 fL (ref 80.0–100.0)
Monocytes Absolute: 0.4 10*3/uL (ref 0.1–1.0)
Monocytes Relative: 3 %
Neutro Abs: 7.7 10*3/uL (ref 1.7–7.7)
Neutrophils Relative %: 66 %
Platelets: 240 10*3/uL (ref 150–400)
RBC: 3.41 MIL/uL — ABNORMAL LOW (ref 3.87–5.11)
RDW: 15.1 % (ref 11.5–15.5)
WBC: 11.7 10*3/uL — ABNORMAL HIGH (ref 4.0–10.5)
nRBC: 0.5 % — ABNORMAL HIGH (ref 0.0–0.2)

## 2020-03-23 LAB — COMPREHENSIVE METABOLIC PANEL
ALT: 24 U/L (ref 0–44)
AST: 24 U/L (ref 15–41)
Albumin: 4 g/dL (ref 3.5–5.0)
Alkaline Phosphatase: 36 U/L — ABNORMAL LOW (ref 38–126)
Anion gap: 9 (ref 5–15)
BUN: 18 mg/dL (ref 8–23)
CO2: 24 mmol/L (ref 22–32)
Calcium: 9.2 mg/dL (ref 8.9–10.3)
Chloride: 103 mmol/L (ref 98–111)
Creatinine, Ser: 0.78 mg/dL (ref 0.44–1.00)
GFR, Estimated: >60 mL/min (ref >60)
Glucose, Bld: 101 mg/dL — ABNORMAL HIGH (ref 70–99)
Potassium: 4.1 mmol/L (ref 3.5–5.1)
Sodium: 136 mmol/L (ref 135–145)
Total Bilirubin: 1.1 mg/dL (ref 0.3–1.2)
Total Protein: 6.6 g/dL (ref 6.5–8.1)

## 2020-03-23 LAB — PHOSPHORUS: Phosphorus: 3.7 mg/dL (ref 2.5–4.6)

## 2020-03-23 LAB — MAGNESIUM: Magnesium: 2.2 mg/dL (ref 1.7–2.4)

## 2020-03-23 LAB — LACTATE DEHYDROGENASE: LDH: 272 U/L — ABNORMAL HIGH (ref 98–192)

## 2020-03-23 LAB — URIC ACID: Uric Acid, Serum: 4 mg/dL (ref 2.5–7.1)

## 2020-03-23 NOTE — Patient Instructions (Addendum)
[place] at [facility] Discharge Instructions  You were seen today by Dr. Delton Coombes. He went over your recent results. You will be prescribed Compazine to take every morning as needed for nausea. You may restart doing your physical exercises. Dr. Delton Coombes will see you back in 3 weeks for labs and follow up.   Thank you for choosing [facility] to provide your oncology and hematology care.  To afford each patient quality time with our provider, please arrive at least 15 minutes before your scheduled appointment time.   If you have a lab appointment with the [facility] please come in thru the Main Entrance and check in at the main information desk  You need to re-schedule your appointment should you arrive 10 or more minutes late.  We strive to give you quality time with our providers, and arriving late affects you and other patients whose appointments are after yours.  Also, if you no show three or more times for appointments you may be dismissed from the clinic at the providers discretion.     Again, thank you for choosing [facility].  Our hope is that these requests will decrease the amount of time that you wait before being seen by our physicians.       _____________________________________________________________  Should you have questions after your visit to [facility], please contact our office at [phone] between the hours of 8:00 a.m. and 4:30 p.m.  Voicemails left after 4:00 p.m. will not be returned until the following business day.  For prescription refill requests, have your pharmacy contact our office and allow 72 hours.    Cancer Center Support Programs:   > Cancer Support Group  2nd Tuesday of the month 1pm-2pm, Journey Room

## 2020-03-23 NOTE — Progress Notes (Signed)
[place], [place] [address]   CLINIC:  Medical Oncology/Hematology  PCP:  Celene Squibb, MD [address] [name] [address]  [phone]  REASON FOR VISIT:  Follow-up for CML  PRIOR THERAPY: None  CURRENT THERAPY: Dasatinib 50 mg QD  INTERVAL HISTORY:  [name], a 85 y.o. female, returns for routine follow-up for her CML. Rhylan was last seen on 03/10/2020.  Today she is accompanied by her granddaughter and she reports feeling well. She started taking Sprycel on 11/6 and reports that she felt nauseous and vomited once several hours after taking it with food; she denies having similar episodes since. She now takes Sprycel at 9 PM and stays up 1 hours before going to sleep. She also reports feeling more tired since starting Sprycel. Her appetite started improving yesterday. She notes that she is drinking 16 ounces of water when taking allopurinol and will get urinary incontinence when she stands up.  She recently received her COVID booster. She will be gone for the week of Thanksgiving and return on 12/7.   REVIEW OF SYSTEMS:  Review of Systems  Constitutional: Positive for appetite change (50%) and fatigue (75%).  Gastrointestinal: Positive for nausea (x1 episode after first dose of Sprycel) and vomiting (x1 episode after first dose of Sprycel).  Genitourinary: Positive for bladder incontinence.   All other systems reviewed and are negative.   PAST MEDICAL/SURGICAL HISTORY:  Past Medical History:  Diagnosis Date  . Bladder infection, chronic   . Cancer ([place]) 02/2018   right breast cancer  . Complication of anesthesia   . GERD (gastroesophageal reflux disease)   . Hemorrhoids   . Hypercholesterolemia   . Polymyalgia rheumatica ([place])   . PONV (postoperative nausea and vomiting)   . S/P colonoscopy Jun 14, 2004   friable internal and external hemorrhoids, left-sided diverticula  . S/P endoscopy August 13, 2006   pale  1-3 mm nodules consistent with benign squamous papilloma, tiny hiatal hernia  . Serrated adenoma of colon    Past Surgical History:  Procedure Laterality Date  . ABDOMINAL HYSTERECTOMY    . BREAST LUMPECTOMY WITH RADIOACTIVE SEED LOCALIZATION Right 03/12/2018   Procedure: RIGHT BREAST LUMPECTOMY WITH RADIOACTIVE SEED LOCALIZATION;  Surgeon: Fanny Skates, MD;  Location: [facility];  Service: General;  Laterality: Right;  . CHOLECYSTECTOMY    . COLONOSCOPY  11/20/2010   Dr. Gala Romney- serrated adenomaL side diverticulosis, hemorrhoids  . COLONOSCOPY  06/14/04   friable internal and external hemorrhoids, left-sided diverticula  . COLONOSCOPY N/A 08/23/2016   Dr. Gala Romney: Diverticulosis, medium-sized grade 2 internal hemorrhoids.  . ESOPHAGOGASTRODUODENOSCOPY  08/13/06   pale 1-3 mm nodules consistent with benign squamous papilloma, tiny hiatal hernia  . ESOPHAGOGASTRODUODENOSCOPY N/A 08/23/2016   Dr. Gala Romney: Normal  . FLEXIBLE SIGMOIDOSCOPY  11/15/2011   Procedure: FLEXIBLE SIGMOIDOSCOPY;  Surgeon: Daneil Dolin, MD;  [facility];  Service: Endoscopy;  Laterality: N/A;  12:30PM  . WRIST FRACTURE SURGERY Right     SOCIAL HISTORY:  Social History   Socioeconomic History  . Marital status: Widowed    Spouse name: Not on file  . Number of children: 2  . Years of education: Not on file  . Highest education level: Not on file  Occupational History  . Not on file  Tobacco Use  . Smoking status: Never Smoker  . Smokeless tobacco: Never Used  Vaping Use  . Vaping Use: Never  used  Substance and Sexual Activity  . Alcohol use: Yes    Alcohol/week: 2.0 standard drinks    Types: 2 Glasses of wine per week    Comment: Drinks wine 2-3 days per week ( usually a couple of glasses each time)  . Drug use: No  . Sexual activity: Not on file  Other Topics Concern  . Not on file  Social History Narrative  . Not on file   Social Determinants of Health   Financial Resource  Strain: Low Risk   . Difficulty of Paying Living Expenses: Not hard at all  Food Insecurity: No Food Insecurity  . Worried About Charity fundraiser in the Last Year: Never true  . Ran Out of Food in the Last Year: Never true  Transportation Needs: No Transportation Needs  . Lack of Transportation (Medical): No  . Lack of Transportation (Non-Medical): No  Physical Activity: Insufficiently Active  . Days of Exercise per Week: 7 days  . Minutes of Exercise per Session: 20 min  Stress: No Stress Concern Present  . Feeling of Stress : Not at all  Social Connections: Moderately Isolated  . Frequency of Communication with Friends and Family: Twice a week  . Frequency of Social Gatherings with Friends and Family: Twice a week  . Attends Religious Services: More than 4 times per year  . Active Member of Clubs or Organizations: No  . Attends Archivist Meetings: Never  . Marital Status: Widowed  Intimate Partner Violence: Not At Risk  . Fear of Current or Ex-Partner: No  . Emotionally Abused: No  . Physically Abused: No  . Sexually Abused: No    FAMILY HISTORY:  Family History  Problem Relation Age of Onset  . Thyroid cancer Mother   . Parkinson's disease Sister   . Thyroid disease Brother   . Aneurysm Sister   . Thyroid cancer Other   . Thyroid cancer Other   . Colon cancer Neg Hx     CURRENT MEDICATIONS:  Current Outpatient Medications  Medication Sig Dispense Refill  . allopurinol (ZYLOPRIM) 300 MG tablet Take 1 tablet (300 mg total) by mouth daily. 30 tablet 1  . Calcium Carb-Cholecalciferol (CALCIUM 600 + D PO) Take 1 tablet by mouth daily.     . cephALEXin (KEFLEX) 250 MG capsule Take 250 mg by mouth daily.    . Cholecalciferol (VITAMIN D3) 250 MCG (10000 UT) TABS Take by mouth.    . dasatinib (SPRYCEL) 50 MG tablet Take 1 tablet (50 mg total) by mouth daily. 30 tablet 0  . magnesium 30 MG tablet Take 500 mg by mouth daily.    . Multiple Vitamin (MULTIVITAMIN  WITH MINERALS) TABS tablet Take 1 tablet by mouth daily.    . Multiple Vitamins-Minerals (ZINC PO) Take by mouth.    . polyethylene glycol (MIRALAX / GLYCOLAX) packet Take 17 g by mouth daily as needed.     . tamoxifen (NOLVADEX) 20 MG tablet TAKE 1 TABLET BY MOUTH DAILY. 90 tablet 3  . Wheat Dextrin (BENEFIBER) POWD Take by mouth 2 (two) times daily. Takes 1 tbsp twice a day     . Zinc Sulfate (ZINC 15 PO) Take 1 tablet by mouth daily.     No current facility-administered medications for this visit.    ALLERGIES:  Allergies  Allergen Reactions  . Fish Allergy Nausea And Vomiting  . Shrimp [Shellfish Allergy] Nausea And Vomiting    PHYSICAL EXAM:  Performance status (ECOG): 1 -  Symptomatic but completely ambulatory  Vitals:   03/23/20 1054  BP: (!) 142/46  Pulse: 79  Resp: 18  Temp: (!) 97.5 F (36.4 C)  SpO2: 100%   Wt Readings from Last 3 Encounters:  03/23/20 152 lb 12.5 oz (69.3 kg)  03/08/20 154 lb 9.6 oz (70.1 kg)  03/01/20 154 lb 1.6 oz (69.9 kg)   Physical Exam Vitals reviewed.  Constitutional:      Appearance: Normal appearance.  Cardiovascular:     Rate and Rhythm: Normal rate and regular rhythm.     Pulses: Normal pulses.     Heart sounds: Normal heart sounds.  Pulmonary:     Effort: Pulmonary effort is normal.     Breath sounds: Normal breath sounds.  Neurological:     General: No focal deficit present.     Mental Status: She is alert and oriented to person, place, and time.  Psychiatric:        Mood and Affect: Mood normal.        Behavior: Behavior normal.     LABORATORY DATA:  I have reviewed the labs as listed.  CBC Latest Ref Rng & Units 03/10/2020 02/23/2020 02/16/2020  WBC 4.0 - 10.5 K/uL 65.5(HH) 43.8(H) 33.2(H)  Hemoglobin 12.0 - 15.0 g/dL 12.3 12.6 12.2  Hematocrit 36 - 46 % 39.3 39.5 37.3  Platelets 150 - 400 K/uL 343 335 317   CMP Latest Ref Rng & Units 02/16/2020 08/06/2019 04/06/2019  Glucose 70 - 99 mg/dL 114(H) 109(H) 109(H)    BUN 8 - 23 mg/dL _0 Creatinine 0.44 - 1.00 mg/dL 0.81 0.77 0.76  Sodium 135 - 145 mmol/L 139 139 141  Potassium 3.5 - 5.1 mmol/L 4.2 4.5 4.7  Chloride 98 - 111 mmol/L 103 103 107  CO2 22 - 32 mmol/L _1 Calcium 8.9 - 10.3 mg/dL 9.2 9.2 8.8(L)  Total Protein 6.5 - 8.1 g/dL 6.0(L) 6.8 6.5  Total Bilirubin 0.3 - 1.2 mg/dL 0.8 1.0 0.7  Alkaline Phos 38 - 126 U/L 32(L) 35(L) 37(L)  AST 15 - 41 U/L _2 ALT 0 - 44 U/L _3 Component Value Date/Time   RBC 4.00 03/10/2020 0752   MCV 98.3 03/10/2020 0752   MCV 93 09/13/2013 1842   MCH 30.8 03/10/2020 0752   MCHC 31.3 03/10/2020 0752   RDW 15.4 03/10/2020 0752   RDW 14.1 09/13/2013 1842   LYMPHSABS 10.5 (H) 03/10/2020 0752   LYMPHSABS 0.8 (L) 09/13/2013 1842   MONOABS 0.0 (L) 03/10/2020 0752   MONOABS 0.4 09/13/2013 1842   EOSABS 0.0 03/10/2020 0752   EOSABS 0.0 09/13/2013 1842   BASOSABS 2.0 (H) 03/10/2020 0752   BASOSABS 0.0 09/13/2013 1842    DIAGNOSTIC IMAGING:  I have independently reviewed the scans and discussed with the patient. No results found.   ASSESSMENT:  1.  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  BCR/ABL by FISH on 02/23/2020 was positive for fusion protein. 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 -Chromosome analysis confirms Philadelphia chromosome. -BCR/ABL by quantitative PCR with B2 A2 transcript 132% -Dasatinib 50 mg daily started on 03/13/2020.  2.  Right breast DCIS: -Screening mammogram on 02/03/2018 was abnormal. -Ultrasound of the right breast on 02/11/2018 shows mass measuring 0.5 x 0.4 x 0.5 cm, circumscribed hypoechoic in the 3:30 o'clock location of the right breast. Right axilla ultrasound was negative for adenopathy. -Right breast  core biopsy on 02/18/2018 shows ductal carcinoma with papillary configuration. In the comment section, it notes that this may represent DCIS involving a papillary lesion or it may  represent an intracystic papillary ductal carcinoma. ER/PR was 95% positive. -Right breast lumpectomy on 03/12/2018, pathology showing intermediate grade DCIS, 0.6 cm, with areas showing papillary configuration. Posterior margin was 0.2 cm. No evidence of invasion. -Radiation therapy was refused. -Tamoxifen for 5 years started on 04/09/2018. -Mammogram on 02/16/2020 was BI-RADS Category 2.  3.  Osteoporosis: -DEXA scan on 04/11/2018 with T score of -2.9. -She is following up with Dr. Nevada Crane for further management.   PLAN:  1.  CML in chronic phase: -She had some nausea with dasatinib. -We will send her Compazine 10 mg every 6 hours as needed. -He also felt tired after starting Sprycel.  I have told her to be active, which will improve the tiredness. -Reviewed labs today.  White count is 11.7.  Hemoglobin is 10.6 with platelet count of 240.  LDH is 272. -She had tremendous response with white count almost normalized within 10 days after starting Sprycel.  Her hemoglobin decreased to 10.6 from 12.6 prior to start of Sprycel. -She will come back around 04/12/2020 as she has a trip planned to [place]. -If she develops any cytopenias at next visit, consider decreasing dasatinib to 20 mg daily.  2.  Hyperuricemia: -Uric acid is 4.  Continue allopurinol 300 mg daily. -Other tumor lysis labs including phosphate, magnesium and potassium are normal.  Orders placed this encounter:  Orders Placed This Encounter  Procedures  . CBC with Differential/Platelet  . Comprehensive metabolic panel  . Lactate dehydrogenase  . Magnesium  . Phosphorus  . Uric acid     Derek Jack, MD [place] [phone]   I, Milinda Antis, am acting as a scribe for Dr. Sanda Linger.  I, Derek Jack MD, have reviewed the above documentation for accuracy and completeness, and I agree with the above.

## 2020-03-24 ENCOUNTER — Other Ambulatory Visit (HOSPITAL_COMMUNITY): Payer: Self-pay | Admitting: Hematology

## 2020-03-24 ENCOUNTER — Other Ambulatory Visit (HOSPITAL_COMMUNITY): Payer: Self-pay

## 2020-03-24 MED ORDER — PROCHLORPERAZINE MALEATE 10 MG PO TABS: 10.0000 mg | ORAL_TABLET | Freq: Four times a day (QID) | ORAL | 3 refills | Status: DC | PRN

## 2020-03-24 MED ORDER — PROCHLORPERAZINE MALEATE 10 MG PO TABS: 10.0000 mg | ORAL_TABLET | Freq: Four times a day (QID) | ORAL | 0 refills | Status: DC | PRN

## 2020-03-24 NOTE — Progress Notes (Unsigned)
compa

## 2020-03-28 ENCOUNTER — Other Ambulatory Visit (HOSPITAL_COMMUNITY): Payer: Self-pay

## 2020-03-28 ENCOUNTER — Telehealth (HOSPITAL_COMMUNITY): Payer: Self-pay | Admitting: Pharmacy Technician

## 2020-03-28 DIAGNOSIS — C9210 Chronic myeloid leukemia, BCR/ABL-positive, not having achieved remission: Secondary | ICD-10-CM

## 2020-03-28 MED ORDER — DASATINIB 50 MG PO TABS: 50.0000 mg | ORAL_TABLET | Freq: Every day | ORAL | 0 refills | Status: DC

## 2020-03-28 NOTE — Telephone Encounter (Signed)
Oral Oncology Patient Advocate Encounter  Patient stopped by [facility] to complete application for [place] in an effort to reduce patient's out of pocket expense for Sprycel to $0.    Application completed and faxed to [phone].   [employer] Access Support phone number for follow up is [phone].   This encounter will be updated until final determination.  [place] Patient [place] Phone [phone] Fax [phone] 03/28/2020 1:59 PM

## 2020-03-30 ENCOUNTER — Telehealth (HOSPITAL_COMMUNITY): Payer: Self-pay | Admitting: Pharmacy Technician

## 2020-03-30 NOTE — Telephone Encounter (Signed)
Oral Oncology Patient Advocate Encounter   I called Mrs Cavendish after speaking with a rep from [place] Patient [employer].  Rep stated that PSI had funding available and that the patient needed to apply for assistance.  With Mrs Callies's permission I submitted an online application for Sprycel copay assistance through [employer] [place] ([employer]).    The process has taken other patients about 8 weeks to be approved.  If we are not able to secure grant funding in a timely manner then I will reach out to [employer] to let them know that patient will need assistance.  I spoke with the patient to inform them that PSI would reach out to them by phone, email, or mail to request more information like financial and medical documents.  PSI will not release any information to me or the office about the amount of his grant or the approval dates.  They will release billing information only.    [place] [facility] Phone [phone] Fax [phone] 03/30/2020 12:07 PM

## 2020-04-04 ENCOUNTER — Other Ambulatory Visit (HOSPITAL_COMMUNITY): Payer: Self-pay | Admitting: Hematology

## 2020-04-04 DIAGNOSIS — C9210 Chronic myeloid leukemia, BCR/ABL-positive, not having achieved remission: Secondary | ICD-10-CM

## 2020-04-05 NOTE — Telephone Encounter (Signed)
Patient has been referred to [place] [facility] per phone call with access support specialist.  I will follow up with BMSPAF on Thursday to check prescription status.    Patient will run out on 12/6.  She is out of town and will return 12/7.  [place] Patient [place] Phone [phone] Fax [phone] 04/05/2020 11:00 AM

## 2020-04-11 ENCOUNTER — Other Ambulatory Visit (HOSPITAL_COMMUNITY): Payer: PPO

## 2020-04-12 ENCOUNTER — Ambulatory Visit (HOSPITAL_COMMUNITY): Payer: PPO | Admitting: Hematology

## 2020-04-13 ENCOUNTER — Telehealth (HOSPITAL_COMMUNITY): Payer: Self-pay | Admitting: *Deleted

## 2020-04-13 ENCOUNTER — Other Ambulatory Visit: Payer: Self-pay

## 2020-04-13 ENCOUNTER — Inpatient Hospital Stay (HOSPITAL_COMMUNITY): Payer: PPO | Attending: Hematology and Oncology

## 2020-04-13 DIAGNOSIS — D72819 Decreased white blood cell count, unspecified: Secondary | ICD-10-CM | POA: Diagnosis not present

## 2020-04-13 DIAGNOSIS — Z79899 Other long term (current) drug therapy: Secondary | ICD-10-CM | POA: Insufficient documentation

## 2020-04-13 DIAGNOSIS — E790 Hyperuricemia without signs of inflammatory arthritis and tophaceous disease: Secondary | ICD-10-CM | POA: Insufficient documentation

## 2020-04-13 DIAGNOSIS — M810 Age-related osteoporosis without current pathological fracture: Secondary | ICD-10-CM | POA: Insufficient documentation

## 2020-04-13 DIAGNOSIS — R5383 Other fatigue: Secondary | ICD-10-CM | POA: Diagnosis not present

## 2020-04-13 DIAGNOSIS — C9210 Chronic myeloid leukemia, BCR/ABL-positive, not having achieved remission: Secondary | ICD-10-CM | POA: Diagnosis not present

## 2020-04-13 DIAGNOSIS — M353 Polymyalgia rheumatica: Secondary | ICD-10-CM | POA: Insufficient documentation

## 2020-04-13 DIAGNOSIS — D0511 Intraductal carcinoma in situ of right breast: Secondary | ICD-10-CM | POA: Insufficient documentation

## 2020-04-13 DIAGNOSIS — E7800 Pure hypercholesterolemia, unspecified: Secondary | ICD-10-CM | POA: Insufficient documentation

## 2020-04-13 DIAGNOSIS — K219 Gastro-esophageal reflux disease without esophagitis: Secondary | ICD-10-CM | POA: Diagnosis not present

## 2020-04-13 DIAGNOSIS — Z79810 Long term (current) use of selective estrogen receptor modulators (SERMs): Secondary | ICD-10-CM | POA: Insufficient documentation

## 2020-04-13 DIAGNOSIS — R110 Nausea: Secondary | ICD-10-CM | POA: Insufficient documentation

## 2020-04-13 LAB — CBC WITH DIFFERENTIAL/PLATELET
Abs Immature Granulocytes: 0.01 10*3/uL (ref 0.00–0.07)
Basophils Absolute: 0 10*3/uL (ref 0.0–0.1)
Basophils Relative: 2 %
Eosinophils Absolute: 0.1 10*3/uL (ref 0.0–0.5)
Eosinophils Relative: 5 %
HCT: 32.2 % — ABNORMAL LOW (ref 36.0–46.0)
Hemoglobin: 10.2 g/dL — ABNORMAL LOW (ref 12.0–15.0)
Immature Granulocytes: 1 %
Lymphocytes Relative: 48 %
Lymphs Abs: 0.6 10*3/uL — ABNORMAL LOW (ref 0.7–4.0)
MCH: 31.5 pg (ref 26.0–34.0)
MCHC: 31.7 g/dL (ref 30.0–36.0)
MCV: 99.4 fL (ref 80.0–100.0)
Monocytes Absolute: 0.2 10*3/uL (ref 0.1–1.0)
Monocytes Relative: 12 %
Neutro Abs: 0.4 10*3/uL — CL (ref 1.7–7.7)
Neutrophils Relative %: 32 %
Platelets: 152 10*3/uL (ref 150–400)
RBC: 3.24 MIL/uL — ABNORMAL LOW (ref 3.87–5.11)
RDW: 16 % — ABNORMAL HIGH (ref 11.5–15.5)
WBC: 1.3 10*3/uL — CL (ref 4.0–10.5)
nRBC: 0 % (ref 0.0–0.2)

## 2020-04-13 LAB — COMPREHENSIVE METABOLIC PANEL
ALT: 17 U/L (ref 0–44)
AST: 22 U/L (ref 15–41)
Albumin: 3.8 g/dL (ref 3.5–5.0)
Alkaline Phosphatase: 31 U/L — ABNORMAL LOW (ref 38–126)
Anion gap: 6 (ref 5–15)
BUN: 11 mg/dL (ref 8–23)
CO2: 25 mmol/L (ref 22–32)
Calcium: 8.2 mg/dL — ABNORMAL LOW (ref 8.9–10.3)
Chloride: 106 mmol/L (ref 98–111)
Creatinine, Ser: 0.65 mg/dL (ref 0.44–1.00)
GFR, Estimated: >60 mL/min (ref >60)
Glucose, Bld: 101 mg/dL — ABNORMAL HIGH (ref 70–99)
Potassium: 3.5 mmol/L (ref 3.5–5.1)
Sodium: 137 mmol/L (ref 135–145)
Total Bilirubin: 0.9 mg/dL (ref 0.3–1.2)
Total Protein: 6.3 g/dL — ABNORMAL LOW (ref 6.5–8.1)

## 2020-04-13 LAB — MAGNESIUM: Magnesium: 2 mg/dL (ref 1.7–2.4)

## 2020-04-13 LAB — URIC ACID: Uric Acid, Serum: 3.2 mg/dL (ref 2.5–7.1)

## 2020-04-13 LAB — LACTATE DEHYDROGENASE: LDH: 152 U/L (ref 98–192)

## 2020-04-13 LAB — PHOSPHORUS: Phosphorus: 3.3 mg/dL (ref 2.5–4.6)

## 2020-04-13 NOTE — Telephone Encounter (Signed)
CRITICAL VALUE ALERT Critical value received:  WBC 1.3  Date of notification:  04/13/2020 at [place] value read back:  Yes.    Nurse who received alert:  A. Ouida Sills, RN  MD response:  Per Dr. Tomie China last office note, he would consider decreasing Sprycel to 20 mg daily should pt develop any cytopenias.  I spoke with pt to advise her to decrease dose to 20 mg daily.  She informs me that she has been without her Sprycel since 04/10/2020 and has not had a dose since that day. She is awaiting co-pay assistance and new Rx to be delivered, and is working with Dennison Nancy on this.  She was notified of her WBC count and instructed to hold Sprycel until f/u office visit with Dr. Burr Medico in our clinic on 04/19/20.  Also advised pt on neutropenic precautions.  Pt verbalizes understanding of all of the above.

## 2020-04-14 NOTE — Telephone Encounter (Signed)
Spoke to [employer] rep on 12/6 to check status of application.  BMS rep stated that the rx was not in their queue so I faxed the rx to a managers fax to be directed to the correct dept.  (faxed to [phone]).  Rep stated for me to call back around lunch to verify rx was received.  I called back after lunch and rep contacted manager and he had received the rx and forwarded it to the correct dept.    Rep stated to call back in a few days if I had not received any communication from them.  [place] Patient [place] Phone [phone] Fax [phone] 04/14/2020 9:40 AM

## 2020-04-14 NOTE — Telephone Encounter (Signed)
Oral Chemotherapy Pharmacist Encounter   Received a call this morning from Ms. Leopard. She was very considered about her WBC and wanted to know what to do about her Sprycel. Reviewed the plan documented below with Ms. Icard, she is to hold her Sprycel until f/u office visit on 04/19/20. She stated her understanding of the plan  Darl Pikes, PharmD, BCPS, BCOP, CPP Hematology/Oncology Clinical Pharmacist [facility] [phone]  04/14/2020 9:27 AM

## 2020-04-18 ENCOUNTER — Ambulatory Visit (HOSPITAL_COMMUNITY): Payer: PPO | Admitting: Hematology

## 2020-04-18 NOTE — Progress Notes (Signed)
[facility]   [phone] [phone]   Clinic Follow up Note   Patient Care Team: Celene Squibb, MD as PCP - General (Internal Medicine) Gala Romney Cristopher Estimable, MD (Gastroenterology)  Date of Service:  04/19/2020  CHIEF COMPLAINT: F/u of CML   CURRENT THERAPY:  Dasatinib 50 mg QD starting 03/13/20 Tamoxifen 50m daily since 04/09/2018 for breast cancer   INTERVAL HISTORY:  BCOLLETTA SPILLERSis here for a follow up of CML. She is under the care of Dr KDelton Coombesand is seeing me today in interim.  She did complete 1 months dasatinib, tolerated well overall with mild nausea.  However she ran out of medicine 2 weeks ago, she has contacted the pharmacy, and a refill will be mailed out to her tonight.  Due to her significan leukopenia last week, she was advised to stop dasatinib any way.  Mild fatigue, otherwise doing well, she denies any significant pain, leg edema, shortness of breath or other new symptoms.  Her weight has been stable.  All other systems were reviewed with the patient and are negative.  MEDICAL HISTORY:  Past Medical History:  Diagnosis Date  . Bladder infection, chronic   . [place]   right breast cancer  . Complication of anesthesia   . GERD (gastroesophageal reflux disease)   . Hemorrhoids   . Hypercholesterolemia   . Polymyalgia rheumatica ([facility]   . PONV (postoperative nausea and vomiting)   . S/P colonoscopy Jun 14, 2004   friable internal and external hemorrhoids, left-sided diverticula  . S/P endoscopy August 13, 2006   pale 1-3 mm nodules consistent with benign squamous papilloma, tiny hiatal hernia  . Serrated adenoma of colon     SURGICAL HISTORY: Past Surgical History:  Procedure Laterality Date  . ABDOMINAL HYSTERECTOMY    . BREAST LUMPECTOMY WITH RADIOACTIVE SEED LOCALIZATION Right 03/12/2018   Procedure: RIGHT BREAST LUMPECTOMY WITH RADIOACTIVE SEED LOCALIZATION;  Surgeon: IFanny Skates MD;  [facility] [facility]  Service: General;  Laterality: Right;  . CHOLECYSTECTOMY    . COLONOSCOPY  11/20/2010   Dr. RGala Romney serrated adenomaL side diverticulosis, hemorrhoids  . COLONOSCOPY  06/14/04   friable internal and external hemorrhoids, left-sided diverticula  . COLONOSCOPY N/A 08/23/2016   Dr. RGala Romney Diverticulosis, medium-sized grade 2 internal hemorrhoids.  . ESOPHAGOGASTRODUODENOSCOPY  08/13/06   pale 1-3 mm nodules consistent with benign squamous papilloma, tiny hiatal hernia  . ESOPHAGOGASTRODUODENOSCOPY N/A 08/23/2016   Dr. RGala Romney Normal  . FLEXIBLE SIGMOIDOSCOPY  11/15/2011   Procedure: FLEXIBLE SIGMOIDOSCOPY;  Surgeon: RDaneil Dolin MD;  [facility];  Service: Endoscopy;  Laterality: N/A;  12:30PM  . WRIST FRACTURE SURGERY Right     I have reviewed the social history and family history with the patient and they are unchanged from previous note.  ALLERGIES:  is allergic to fish allergy and shrimp [shellfish allergy].  MEDICATIONS:  Current Outpatient Medications  Medication Sig Dispense Refill  . allopurinol (ZYLOPRIM) 300 MG tablet Take 1 tablet (300 mg total) by mouth daily. 30 tablet 1  . Calcium Carb-Cholecalciferol (CALCIUM 600 + D PO) Take 1 tablet by mouth daily.     . cephALEXin (KEFLEX) 250 MG capsule Take 250 mg by mouth daily.    . Cholecalciferol (VITAMIN D3) 250 MCG (10000 UT) TABS Take by mouth.    . magnesium 30 MG tablet Take 500 mg by mouth daily.    . Multiple Vitamin (MULTIVITAMIN WITH MINERALS) TABS tablet  Take 1 tablet by mouth daily.    . Multiple Vitamins-Minerals (ZINC PO) Take by mouth.    . polyethylene glycol (MIRALAX / GLYCOLAX) packet Take 17 g by mouth daily as needed.     . prochlorperazine (COMPAZINE) 10 MG tablet Take 1 tablet (10 mg total) by mouth every 6 (six) hours as needed for nausea or vomiting. 30 tablet 0  . prochlorperazine (COMPAZINE) 10 MG tablet Take 1 tablet (10 mg total) by mouth every 6 (six) hours as needed for nausea or  vomiting. 60 tablet 3  . SPRYCEL 50 MG tablet TAKE 1 TABLET (50 MG TOTAL) BY MOUTH DAILY. 30 tablet 0  . tamoxifen (NOLVADEX) 20 MG tablet TAKE 1 TABLET BY MOUTH DAILY. 90 tablet 3  . Wheat Dextrin (BENEFIBER) POWD Take by mouth 2 (two) times daily. Takes 1 tbsp twice a day    . Zinc Sulfate (ZINC 15 PO) Take 1 tablet by mouth daily.     No current facility-administered medications for this visit.    PHYSICAL EXAMINATION: ECOG PERFORMANCE STATUS: 1 - Symptomatic but completely ambulatory  Vitals:   04/19/20 1331  BP: (!) 129/57  Pulse: 76  Resp: 17  Temp: 98 F (36.7 C)  SpO2: 100%   Filed Weights   04/19/20 1331  Weight: 152 lb 7 oz (69.1 kg)    GENERAL:alert, no distress and comfortable SKIN: skin color, texture, turgor are normal, no rashes or significant lesions EYES: normal, Conjunctiva are pink and non-injected, sclera clear NECK: supple, thyroid normal size, non-tender, without nodularity LYMPH:  no palpable lymphadenopathy in the cervical, axillary  LUNGS: clear to auscultation and percussion with normal breathing effort HEART: regular rate & rhythm and no murmurs and no lower extremity edema ABDOMEN:abdomen soft, non-tender and normal bowel sounds Musculoskeletal:no cyanosis of digits and no clubbing  NEURO: alert & oriented x 3 with fluent speech, no focal motor/sensory deficits  LABORATORY DATA:  I have reviewed the data as listed CBC Latest Ref Rng & Units 04/19/2020 04/13/2020 03/23/2020  WBC 4.0 - 10.5 K/uL 2.3(L) 1.3(LL) 11.7(H)  Hemoglobin 12.0 - 15.0 g/dL 10.7(L) 10.2(L) 10.6(L)  Hematocrit 36.0 - 46.0 % 33.6(L) 32.2(L) 33.1(L)  Platelets 150 - 400 K/uL 192 152 240     CMP Latest Ref Rng & Units 04/13/2020 03/23/2020 02/16/2020  Glucose 70 - 99 mg/dL 101(H) 101(H) 114(H)  BUN 8 - 23 mg/dL 11 18 17   Creatinine 0.44 - 1.00 mg/dL 0.65 0.78 0.81  Sodium 135 - 145 mmol/L 137 136 139  Potassium 3.5 - 5.1 mmol/L 3.5 4.1 4.2  Chloride 98 - 111 mmol/L 106  103 103  CO2 22 - 32 mmol/L 25 24 26   Calcium 8.9 - 10.3 mg/dL 8.2(L) 9.2 9.2  Total Protein 6.5 - 8.1 g/dL 6.3(L) 6.6 6.0(L)  Total Bilirubin 0.3 - 1.2 mg/dL 0.9 1.1 0.8  Alkaline Phos 38 - 126 U/L 31(L) 36(L) 32(L)  AST 15 - 41 U/L 22 24 30   ALT 0 - 44 U/L 17 24 22       RADIOGRAPHIC STUDIES: I have personally reviewed the radiological images as listed and agreed with the findings in the report. No results found.   ASSESSMENT & PLAN:  TATUM CORL is a 85 y.o. female with    1. 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 BCR/ABL by FISH on 02/23/2020 was positive for fusion protein. -Bone marrow biopsy on 03/10/2020 showed hypercellular marrow with CML in chronic phase. Chromosome analysis confirms [place] chromosome. -  BCR/ABL by quantitative PCR with B2 A2 transcript 132% -Currently being treated with Dasatinib 50 mg daily starting on 03/13/2020, but run out 2 weeks ago. Tolerating well with mild nausea and fatigue.  -She did have significant leukopenia with treatment, with ANC 0.4 last week.  She has been off dasatinib for 2 weeks.  Repeated CBC today showed improved neutropenia, 1 today.  We will restart her at 15m daily, and repeat CBC in 2 weeks, if she develops neutropenia again, will consider dose reduction.   -She knows to watch for signs of infection, and to take precautions for neutropenia.  -f/u in 4 weeks    2.Right breast DCIS: -Diagnosed in 02/2018. Right breast core biopsy on 02/18/2018 shows ductal carcinoma with papillary configuration. ER/PR was 95% positive. -Treated by Right breast lumpectomy on 03/12/2018, pathology showed intermediate grade DCIS, 0.6 cm, with areas showing papillary configuration. Posterior margin was 0.2 cm. No evidence of invasion. -Radiation therapy was refused. -She started Tamoxifen for 5 years on 04/09/2018.  She is tolerating well, will continue.  3. Osteoporosis: -DEXA scan on 04/11/2018 with T score of  -2.9. -She is following up with Dr. HNevada Cranefor further management.  4. Hyperuricemia: -Uric acid is 4. Continue allopurinol 300 mg daily. -Other tumor lysis labs including phosphate, magnesium and potassium are normal.   PLAN:  -She will receive refill of dasatinib tomorrow, and restart at 50 mg daily -Lab in 2 weeks, to see if needs dose adjustment for severe neutropenia again -Follow-up with Dr. KDelton Coombesin 4 weeks with lab    No problem-specific Assessment & Plan notes found for this encounter.   Orders Placed This Encounter  Procedures  . CBC with Differential (Cancer Center Only)    Standing Status:   Future    Number of Occurrences:   1    Standing Expiration Date:   04/19/2021  . CBC with Differential    Standing Status:   Future    Number of Occurrences:   1    Standing Expiration Date:   04/19/2021   All questions were answered. The patient knows to call the clinic with any problems, questions or concerns. No barriers to learning was detected. The total time spent in the appointment was 30 minutes.     YTruitt Merle MD 04/19/2020   I, AJoslyn Devon am acting as scribe for YTruitt Merle MD.   I have reviewed the above documentation for accuracy and completeness, and I agree with the above.

## 2020-04-19 ENCOUNTER — Encounter (HOSPITAL_COMMUNITY): Payer: Self-pay | Admitting: Hematology

## 2020-04-19 ENCOUNTER — Inpatient Hospital Stay (HOSPITAL_COMMUNITY): Payer: PPO | Admitting: Hematology

## 2020-04-19 ENCOUNTER — Inpatient Hospital Stay (HOSPITAL_COMMUNITY): Payer: PPO

## 2020-04-19 ENCOUNTER — Other Ambulatory Visit: Payer: Self-pay

## 2020-04-19 ENCOUNTER — Other Ambulatory Visit (HOSPITAL_COMMUNITY): Payer: Self-pay

## 2020-04-19 VITALS — BP 129/57 | HR 76 | Temp 98.0°F | Resp 17 | Wt 152.4 lb

## 2020-04-19 DIAGNOSIS — C9210 Chronic myeloid leukemia, BCR/ABL-positive, not having achieved remission: Secondary | ICD-10-CM

## 2020-04-19 LAB — CBC WITH DIFFERENTIAL/PLATELET
Abs Immature Granulocytes: 0.01 10*3/uL (ref 0.00–0.07)
Basophils Absolute: 0 10*3/uL (ref 0.0–0.1)
Basophils Relative: 1 %
Eosinophils Absolute: 0 10*3/uL (ref 0.0–0.5)
Eosinophils Relative: 2 %
HCT: 33.6 % — ABNORMAL LOW (ref 36.0–46.0)
Hemoglobin: 10.7 g/dL — ABNORMAL LOW (ref 12.0–15.0)
Immature Granulocytes: 0 %
Lymphocytes Relative: 40 %
Lymphs Abs: 0.9 10*3/uL (ref 0.7–4.0)
MCH: 31.7 pg (ref 26.0–34.0)
MCHC: 31.8 g/dL (ref 30.0–36.0)
MCV: 99.4 fL (ref 80.0–100.0)
Monocytes Absolute: 0.2 10*3/uL (ref 0.1–1.0)
Monocytes Relative: 7 %
Neutro Abs: 1.1 10*3/uL — ABNORMAL LOW (ref 1.7–7.7)
Neutrophils Relative %: 50 %
Platelets: 192 10*3/uL (ref 150–400)
RBC: 3.38 MIL/uL — ABNORMAL LOW (ref 3.87–5.11)
RDW: 15.9 % — ABNORMAL HIGH (ref 11.5–15.5)
WBC: 2.3 10*3/uL — ABNORMAL LOW (ref 4.0–10.5)
nRBC: 0 % (ref 0.0–0.2)

## 2020-04-19 NOTE — Patient Instructions (Signed)
[place] at [facility] Discharge Instructions  You were seen and examined today by Dr. Burr Medico. Dr. Burr Medico discussed checking your lab work today. You can resume your Sprycel when you get it. We will then recheck your lab work and see you back in 2 weeks.   Thank you for choosing [place] at [facility] to provide your oncology and hematology care.  To afford each patient quality time with our provider, please arrive at least 15 minutes before your scheduled appointment time.   If you have a lab appointment with the [place] please come in thru the Main Entrance and check in at the main information desk.  You need to re-schedule your appointment should you arrive 10 or more minutes late.  We strive to give you quality time with our providers, and arriving late affects you and other patients whose appointments are after yours.  Also, if you no show three or more times for appointments you may be dismissed from the clinic at the providers discretion.     Again, thank you for choosing [facility].  Our hope is that these requests will decrease the amount of time that you wait before being seen by our physicians.       _____________________________________________________________  Should you have questions after your visit to [facility], please contact our office at [phone] and follow the prompts.  Our office hours are 8:00 a.m. and 4:30 p.m. Monday - Friday.  Please note that voicemails left after 4:00 p.m. may not be returned until the following business day.  We are closed weekends and major holidays.  You do have access to a nurse 24-7, just call the main number to the clinic [phone] and do not press any options, hold on the line and a nurse will answer the phone.    For prescription refill requests, have your pharmacy contact our office and allow 72 hours.    Due to Covid, you will need to wear a mask upon  entering the hospital. If you do not have a mask, a mask will be given to you at the Main Entrance upon arrival. For doctor visits, patients may have 1 support person age 38 or older with them. For treatment visits, patients can not have anyone with them due to social distancing guidelines and our immunocompromised population.

## 2020-04-19 NOTE — Telephone Encounter (Signed)
Oral Oncology Patient Advocate Encounter  Received notification from [place] Patient [facility] that patient has been successfully enrolled into their program to receive Sprycel from the manufacturer at $0 out of pocket until 05/06/20.    I called and spoke with patient.  She knows we will have to re-apply.   Specialty Pharmacy that will dispense medication is [facility].  Patient knows to call the office with questions or concerns.   Oral Oncology Clinic will continue to follow.  [place] Patient [place] Phone [phone] Fax [phone] 04/19/2020 9:38 AM

## 2020-04-20 NOTE — Telephone Encounter (Signed)
Patient should receive medication today, 04/20/20.  [place] Patient [place] Phone [phone] Fax [phone] 04/20/2020 12:19 PM

## 2020-04-25 ENCOUNTER — Telehealth (HOSPITAL_COMMUNITY): Payer: Self-pay | Admitting: Pharmacy Technician

## 2020-04-25 NOTE — Telephone Encounter (Signed)
Oral Oncology Patient Advocate Encounter  Received communication from [place] that the patient's eligibility in the patient assistance program for Sprycel was due for re-enrollment.    Coordinated with patient to sign application on 44/96/75.  MD portion completed 04/25/20.  The renewal application has been completed and faxed in an effort to keep the patient's out of pocket expense for Sprycel at $0.     Application completed and faxed to [phone].    [place] Patient [employer] phone number for follow up is [phone].    This encounter will be updated until final determination.  [place] Patient [place] Phone [phone] Fax [phone] 04/25/2020 12:25 PM

## 2020-05-03 ENCOUNTER — Inpatient Hospital Stay (HOSPITAL_COMMUNITY): Payer: PPO

## 2020-05-03 ENCOUNTER — Other Ambulatory Visit: Payer: Self-pay

## 2020-05-03 DIAGNOSIS — Z712 Person consulting for explanation of examination or test findings: Secondary | ICD-10-CM | POA: Diagnosis not present

## 2020-05-03 DIAGNOSIS — M2550 Pain in unspecified joint: Secondary | ICD-10-CM | POA: Diagnosis not present

## 2020-05-03 DIAGNOSIS — R300 Dysuria: Secondary | ICD-10-CM | POA: Diagnosis not present

## 2020-05-03 DIAGNOSIS — E7849 Other hyperlipidemia: Secondary | ICD-10-CM | POA: Diagnosis not present

## 2020-05-03 DIAGNOSIS — H6981 Other specified disorders of Eustachian tube, right ear: Secondary | ICD-10-CM | POA: Diagnosis not present

## 2020-05-03 DIAGNOSIS — N390 Urinary tract infection, site not specified: Secondary | ICD-10-CM | POA: Diagnosis not present

## 2020-05-03 DIAGNOSIS — M19049 Primary osteoarthritis, unspecified hand: Secondary | ICD-10-CM | POA: Diagnosis not present

## 2020-05-03 DIAGNOSIS — Z853 Personal history of malignant neoplasm of breast: Secondary | ICD-10-CM | POA: Diagnosis not present

## 2020-05-03 DIAGNOSIS — Z0000 Encounter for general adult medical examination without abnormal findings: Secondary | ICD-10-CM | POA: Diagnosis not present

## 2020-05-03 DIAGNOSIS — Z0001 Encounter for general adult medical examination with abnormal findings: Secondary | ICD-10-CM | POA: Diagnosis not present

## 2020-05-03 DIAGNOSIS — C9210 Chronic myeloid leukemia, BCR/ABL-positive, not having achieved remission: Secondary | ICD-10-CM

## 2020-05-03 DIAGNOSIS — M791 Myalgia, unspecified site: Secondary | ICD-10-CM | POA: Diagnosis not present

## 2020-05-03 DIAGNOSIS — R3915 Urgency of urination: Secondary | ICD-10-CM | POA: Diagnosis not present

## 2020-05-03 DIAGNOSIS — C50311 Malignant neoplasm of lower-inner quadrant of right female breast: Secondary | ICD-10-CM | POA: Diagnosis not present

## 2020-05-03 DIAGNOSIS — D729 Disorder of white blood cells, unspecified: Secondary | ICD-10-CM | POA: Diagnosis not present

## 2020-05-03 LAB — CBC WITH DIFFERENTIAL/PLATELET
Abs Immature Granulocytes: 0 10*3/uL (ref 0.00–0.07)
Basophils Absolute: 0 10*3/uL (ref 0.0–0.1)
Basophils Relative: 2 %
Eosinophils Absolute: 0 10*3/uL (ref 0.0–0.5)
Eosinophils Relative: 1 %
HCT: 33.3 % — ABNORMAL LOW (ref 36.0–46.0)
Hemoglobin: 10.7 g/dL — ABNORMAL LOW (ref 12.0–15.0)
Immature Granulocytes: 0 %
Lymphocytes Relative: 38 %
Lymphs Abs: 0.8 10*3/uL (ref 0.7–4.0)
MCH: 31.8 pg (ref 26.0–34.0)
MCHC: 32.1 g/dL (ref 30.0–36.0)
MCV: 98.8 fL (ref 80.0–100.0)
Monocytes Absolute: 0.2 10*3/uL (ref 0.1–1.0)
Monocytes Relative: 11 %
Neutro Abs: 1 10*3/uL — ABNORMAL LOW (ref 1.7–7.7)
Neutrophils Relative %: 48 %
Platelets: 187 10*3/uL (ref 150–400)
RBC: 3.37 MIL/uL — ABNORMAL LOW (ref 3.87–5.11)
RDW: 14.7 % (ref 11.5–15.5)
WBC: 2 10*3/uL — ABNORMAL LOW (ref 4.0–10.5)
nRBC: 0 % (ref 0.0–0.2)

## 2020-05-05 NOTE — Telephone Encounter (Signed)
Oral Oncology Patient Advocate Encounter  Received notification from [facility] [place] [facility] that patient has been successfully enrolled into their program to receive Sprycel from the manufacturer at $0 out of pocket from 05/07/20-05/06/21.    I called and spoke with patient.  She knows we will have to re-apply.   Patient knows to call the office with questions or concerns.   Oral Oncology Clinic will continue to follow.  Daine Floras CPHT Specialty Pharmacy Patient Advocate [facility] Phone [phone] Fax [phone] 05/05/2020 8:20 AM

## 2020-05-06 DIAGNOSIS — M2550 Pain in unspecified joint: Secondary | ICD-10-CM | POA: Diagnosis not present

## 2020-05-06 DIAGNOSIS — N390 Urinary tract infection, site not specified: Secondary | ICD-10-CM | POA: Diagnosis not present

## 2020-05-06 DIAGNOSIS — R7301 Impaired fasting glucose: Secondary | ICD-10-CM | POA: Diagnosis not present

## 2020-05-06 DIAGNOSIS — M810 Age-related osteoporosis without current pathological fracture: Secondary | ICD-10-CM | POA: Diagnosis not present

## 2020-05-06 DIAGNOSIS — Z853 Personal history of malignant neoplasm of breast: Secondary | ICD-10-CM | POA: Diagnosis not present

## 2020-05-06 DIAGNOSIS — E782 Mixed hyperlipidemia: Secondary | ICD-10-CM | POA: Diagnosis not present

## 2020-05-06 DIAGNOSIS — M791 Myalgia, unspecified site: Secondary | ICD-10-CM | POA: Diagnosis not present

## 2020-05-11 ENCOUNTER — Other Ambulatory Visit (HOSPITAL_COMMUNITY): Payer: Self-pay | Admitting: Hematology

## 2020-05-11 DIAGNOSIS — C9210 Chronic myeloid leukemia, BCR/ABL-positive, not having achieved remission: Secondary | ICD-10-CM

## 2020-05-17 ENCOUNTER — Other Ambulatory Visit: Payer: Self-pay

## 2020-05-17 ENCOUNTER — Inpatient Hospital Stay (HOSPITAL_COMMUNITY): Payer: PPO | Attending: Hematology

## 2020-05-17 DIAGNOSIS — Z923 Personal history of irradiation: Secondary | ICD-10-CM | POA: Insufficient documentation

## 2020-05-17 DIAGNOSIS — N3020 Other chronic cystitis without hematuria: Secondary | ICD-10-CM | POA: Diagnosis not present

## 2020-05-17 DIAGNOSIS — M353 Polymyalgia rheumatica: Secondary | ICD-10-CM | POA: Diagnosis not present

## 2020-05-17 DIAGNOSIS — H538 Other visual disturbances: Secondary | ICD-10-CM | POA: Diagnosis not present

## 2020-05-17 DIAGNOSIS — D0511 Intraductal carcinoma in situ of right breast: Secondary | ICD-10-CM | POA: Diagnosis not present

## 2020-05-17 DIAGNOSIS — Z79810 Long term (current) use of selective estrogen receptor modulators (SERMs): Secondary | ICD-10-CM | POA: Insufficient documentation

## 2020-05-17 DIAGNOSIS — Z170 Estrogen receptor positive status [ER+]: Secondary | ICD-10-CM | POA: Insufficient documentation

## 2020-05-17 DIAGNOSIS — K219 Gastro-esophageal reflux disease without esophagitis: Secondary | ICD-10-CM | POA: Insufficient documentation

## 2020-05-17 DIAGNOSIS — R6889 Other general symptoms and signs: Secondary | ICD-10-CM | POA: Insufficient documentation

## 2020-05-17 DIAGNOSIS — E7800 Pure hypercholesterolemia, unspecified: Secondary | ICD-10-CM | POA: Insufficient documentation

## 2020-05-17 DIAGNOSIS — Z79899 Other long term (current) drug therapy: Secondary | ICD-10-CM | POA: Insufficient documentation

## 2020-05-17 DIAGNOSIS — C9110 Chronic lymphocytic leukemia of B-cell type not having achieved remission: Secondary | ICD-10-CM | POA: Insufficient documentation

## 2020-05-17 DIAGNOSIS — Z87440 Personal history of urinary (tract) infections: Secondary | ICD-10-CM | POA: Insufficient documentation

## 2020-05-17 DIAGNOSIS — Z86010 Personal history of colonic polyps: Secondary | ICD-10-CM | POA: Insufficient documentation

## 2020-05-17 DIAGNOSIS — R5383 Other fatigue: Secondary | ICD-10-CM | POA: Insufficient documentation

## 2020-05-17 DIAGNOSIS — E790 Hyperuricemia without signs of inflammatory arthritis and tophaceous disease: Secondary | ICD-10-CM | POA: Diagnosis not present

## 2020-05-17 DIAGNOSIS — M810 Age-related osteoporosis without current pathological fracture: Secondary | ICD-10-CM | POA: Diagnosis not present

## 2020-05-17 DIAGNOSIS — C9210 Chronic myeloid leukemia, BCR/ABL-positive, not having achieved remission: Secondary | ICD-10-CM

## 2020-05-17 LAB — CBC WITH DIFFERENTIAL/PLATELET
Abs Immature Granulocytes: 0 10*3/uL (ref 0.00–0.07)
Basophils Absolute: 0 10*3/uL (ref 0.0–0.1)
Basophils Relative: 1 %
Eosinophils Absolute: 0.1 10*3/uL (ref 0.0–0.5)
Eosinophils Relative: 3 %
HCT: 33.2 % — ABNORMAL LOW (ref 36.0–46.0)
Hemoglobin: 10.6 g/dL — ABNORMAL LOW (ref 12.0–15.0)
Immature Granulocytes: 0 %
Lymphocytes Relative: 52 %
Lymphs Abs: 1 10*3/uL (ref 0.7–4.0)
MCH: 31.7 pg (ref 26.0–34.0)
MCHC: 31.9 g/dL (ref 30.0–36.0)
MCV: 99.4 fL (ref 80.0–100.0)
Monocytes Absolute: 0.2 10*3/uL (ref 0.1–1.0)
Monocytes Relative: 10 %
Neutro Abs: 0.7 10*3/uL — ABNORMAL LOW (ref 1.7–7.7)
Neutrophils Relative %: 34 %
Platelets: 131 10*3/uL — ABNORMAL LOW (ref 150–400)
RBC: 3.34 MIL/uL — ABNORMAL LOW (ref 3.87–5.11)
RDW: 14.6 % (ref 11.5–15.5)
WBC: 2 10*3/uL — ABNORMAL LOW (ref 4.0–10.5)
nRBC: 0 % (ref 0.0–0.2)

## 2020-05-17 LAB — COMPREHENSIVE METABOLIC PANEL
ALT: 26 U/L (ref 0–44)
AST: 29 U/L (ref 15–41)
Albumin: 3.9 g/dL (ref 3.5–5.0)
Alkaline Phosphatase: 31 U/L — ABNORMAL LOW (ref 38–126)
Anion gap: 6 (ref 5–15)
BUN: 14 mg/dL (ref 8–23)
CO2: 25 mmol/L (ref 22–32)
Calcium: 8.5 mg/dL — ABNORMAL LOW (ref 8.9–10.3)
Chloride: 107 mmol/L (ref 98–111)
Creatinine, Ser: 0.69 mg/dL (ref 0.44–1.00)
GFR, Estimated: >60 mL/min (ref >60)
Glucose, Bld: 101 mg/dL — ABNORMAL HIGH (ref 70–99)
Potassium: 3.8 mmol/L (ref 3.5–5.1)
Sodium: 138 mmol/L (ref 135–145)
Total Bilirubin: 1 mg/dL (ref 0.3–1.2)
Total Protein: 6.3 g/dL — ABNORMAL LOW (ref 6.5–8.1)

## 2020-05-17 LAB — PHOSPHORUS: Phosphorus: 3 mg/dL (ref 2.5–4.6)

## 2020-05-17 LAB — LACTATE DEHYDROGENASE: LDH: 163 U/L (ref 98–192)

## 2020-05-17 LAB — URIC ACID: Uric Acid, Serum: 5.4 mg/dL (ref 2.5–7.1)

## 2020-05-17 LAB — MAGNESIUM: Magnesium: 2.1 mg/dL (ref 1.7–2.4)

## 2020-05-18 NOTE — Progress Notes (Signed)
[address], [place] [address]   CLINIC:  Medical Oncology/Hematology  PCP:  Celene Squibb, MD [address] [name] [address]  [phone]  REASON FOR VISIT:  Follow-up for CML  PRIOR THERAPY: None  CURRENT THERAPY: Dasatinib 50 mg QD  INTERVAL HISTORY:  [name], a 86 y.o. female, returns for routine follow-up for her CML. Tamatha was last seen by Dr. Truitt Merle on 04/19/2020.  Today she reports that she recently had UTI and was prescribed Cipro for 7 days by Dr. Nevada Crane.  Overall her fatigue is stable.  Appetite is 100%.  Has some dry eyes.  She has blurred vision because of that.  REVIEW OF SYSTEMS:  Review of Systems  Constitutional: Positive for fatigue.  Eyes: Positive for eye problems.  All other systems reviewed and are negative.   PAST MEDICAL/SURGICAL HISTORY:  Past Medical History:  Diagnosis Date  . Bladder infection, chronic   . Cancer ([facility]) 02/2018   right breast cancer  . Complication of anesthesia   . GERD (gastroesophageal reflux disease)   . Hemorrhoids   . Hypercholesterolemia   . Polymyalgia rheumatica ([facility])   . PONV (postoperative nausea and vomiting)   . S/P colonoscopy Jun 14, 2004   friable internal and external hemorrhoids, left-sided diverticula  . S/P endoscopy August 13, 2006   pale 1-3 mm nodules consistent with benign squamous papilloma, tiny hiatal hernia  . Serrated adenoma of colon    Past Surgical History:  Procedure Laterality Date  . ABDOMINAL HYSTERECTOMY    . BREAST LUMPECTOMY WITH RADIOACTIVE SEED LOCALIZATION Right 03/12/2018   Procedure: RIGHT BREAST LUMPECTOMY WITH RADIOACTIVE SEED LOCALIZATION;  Surgeon: Fanny Skates, MD;  Location: [facility];  Service: General;  Laterality: Right;  . CHOLECYSTECTOMY    . COLONOSCOPY  11/20/2010   Dr. Gala Romney- serrated adenomaL side diverticulosis, hemorrhoids  . COLONOSCOPY  06/14/04   friable internal and external  hemorrhoids, left-sided diverticula  . COLONOSCOPY N/A 08/23/2016   Dr. Gala Romney: Diverticulosis, medium-sized grade 2 internal hemorrhoids.  . ESOPHAGOGASTRODUODENOSCOPY  08/13/06   pale 1-3 mm nodules consistent with benign squamous papilloma, tiny hiatal hernia  . ESOPHAGOGASTRODUODENOSCOPY N/A 08/23/2016   Dr. Gala Romney: Normal  . FLEXIBLE SIGMOIDOSCOPY  11/15/2011   Procedure: FLEXIBLE SIGMOIDOSCOPY;  Surgeon: Daneil Dolin, MD;  [facility];  Service: Endoscopy;  Laterality: N/A;  12:30PM  . WRIST FRACTURE SURGERY Right     SOCIAL HISTORY:  Social History   Socioeconomic History  . Marital status: Widowed    Spouse name: Not on file  . Number of children: 2  . Years of education: Not on file  . Highest education level: Not on file  Occupational History  . Not on file  Tobacco Use  . Smoking status: Never Smoker  . Smokeless tobacco: Never Used  Vaping Use  . Vaping Use: Never used  Substance and Sexual Activity  . Alcohol use: Yes    Alcohol/week: 2.0 standard drinks    Types: 2 Glasses of wine per week    Comment: Drinks wine 2-3 days per week ( usually a couple of glasses each time)  . Drug use: No  . Sexual activity: Not on file  Other Topics Concern  . Not on file  Social History Narrative  . Not on file   Social Determinants of Health   Financial Resource Strain: Low Risk   . Difficulty of Paying  Living Expenses: Not hard at all  Food Insecurity: No Food Insecurity  . Worried About Charity fundraiser in the Last Year: Never true  . Ran Out of Food in the Last Year: Never true  Transportation Needs: No Transportation Needs  . Lack of Transportation (Medical): No  . Lack of Transportation (Non-Medical): No  Physical Activity: Insufficiently Active  . Days of Exercise per Week: 7 days  . Minutes of Exercise per Session: 20 min  Stress: No Stress Concern Present  . Feeling of Stress : Not at all  Social Connections: Moderately Isolated  . Frequency of  Communication with Friends and Family: Twice a week  . Frequency of Social Gatherings with Friends and Family: Twice a week  . Attends Religious Services: More than 4 times per year  . Active Member of Clubs or Organizations: No  . Attends Archivist Meetings: Never  . Marital Status: Widowed  Intimate Partner Violence: Not At Risk  . Fear of Current or Ex-Partner: No  . Emotionally Abused: No  . Physically Abused: No  . Sexually Abused: No    FAMILY HISTORY:  Family History  Problem Relation Age of Onset  . Thyroid cancer Mother   . Parkinson's disease Sister   . Thyroid disease Brother   . Aneurysm Sister   . Thyroid cancer Other   . Thyroid cancer Other   . Colon cancer Neg Hx     CURRENT MEDICATIONS:  Current Outpatient Medications  Medication Sig Dispense Refill  . allopurinol (ZYLOPRIM) 300 MG tablet Take 1 tablet (300 mg total) by mouth daily. 30 tablet 1  . Calcium Carb-Cholecalciferol (CALCIUM 600 + D PO) Take 1 tablet by mouth daily.     . cephALEXin (KEFLEX) 250 MG capsule Take 250 mg by mouth daily.    . Cholecalciferol (VITAMIN D3) 250 MCG (10000 UT) TABS Take by mouth.    . magnesium 30 MG tablet Take 500 mg by mouth daily.    . Multiple Vitamin (MULTIVITAMIN WITH MINERALS) TABS tablet Take 1 tablet by mouth daily.    . Multiple Vitamins-Minerals (ZINC PO) Take by mouth.    . polyethylene glycol (MIRALAX / GLYCOLAX) packet Take 17 g by mouth daily as needed.     . prochlorperazine (COMPAZINE) 10 MG tablet Take 1 tablet (10 mg total) by mouth every 6 (six) hours as needed for nausea or vomiting. 30 tablet 0  . prochlorperazine (COMPAZINE) 10 MG tablet Take 1 tablet (10 mg total) by mouth every 6 (six) hours as needed for nausea or vomiting. 60 tablet 3  . SPRYCEL 50 MG tablet TAKE 1 TABLET (50 MG TOTAL) BY MOUTH DAILY. 30 tablet 0  . tamoxifen (NOLVADEX) 20 MG tablet TAKE 1 TABLET BY MOUTH DAILY. 90 tablet 3  . Wheat Dextrin (BENEFIBER) POWD Take by  mouth 2 (two) times daily. Takes 1 tbsp twice a day    . Zinc Sulfate (ZINC 15 PO) Take 1 tablet by mouth daily.     No current facility-administered medications for this visit.    ALLERGIES:  Allergies  Allergen Reactions  . Fish Allergy Nausea And Vomiting  . Shrimp [Shellfish Allergy] Nausea And Vomiting    PHYSICAL EXAM:  Performance status (ECOG): 1 - Symptomatic but completely ambulatory  There were no vitals filed for this visit. Wt Readings from Last 3 Encounters:  04/19/20 152 lb 7 oz (69.1 kg)  03/23/20 152 lb 12.5 oz (69.3 kg)  03/08/20 154 lb 9.6 oz (  70.1 kg)   Physical Exam Vitals reviewed.  Constitutional:      Appearance: Normal appearance.  Cardiovascular:     Rate and Rhythm: Normal rate and regular rhythm.     Pulses: Normal pulses.     Heart sounds: Normal heart sounds.  Pulmonary:     Effort: Pulmonary effort is normal.     Breath sounds: Normal breath sounds.  Abdominal:     General: There is no distension.     Palpations: Abdomen is soft. There is no mass.  Musculoskeletal:        General: No swelling.  Skin:    General: Skin is warm.  Neurological:     General: No focal deficit present.     Mental Status: She is alert and oriented to person, place, and time.  Psychiatric:        Mood and Affect: Mood normal.        Behavior: Behavior normal.     LABORATORY DATA:  I have reviewed the labs as listed.  CBC Latest Ref Rng & Units 05/17/2020 05/03/2020 04/19/2020  WBC 4.0 - 10.5 K/uL 2.0(L) 2.0(L) 2.3(L)  Hemoglobin 12.0 - 15.0 g/dL 10.6(L) 10.7(L) 10.7(L)  Hematocrit 36.0 - 46.0 % 33.2(L) 33.3(L) 33.6(L)  Platelets 150 - 400 K/uL 131(L) 187 192   CMP Latest Ref Rng & Units 05/17/2020 04/13/2020 03/23/2020  Glucose 70 - 99 mg/dL 101(H) 101(H) 101(H)  BUN 8 - 23 mg/dL 14 11 18   Creatinine 0.44 - 1.00 mg/dL 0.69 0.65 0.78  Sodium 135 - 145 mmol/L 138 137 136  Potassium 3.5 - 5.1 mmol/L 3.8 3.5 4.1  Chloride 98 - 111 mmol/L 107 106 103  CO2  22 - 32 mmol/L 25 25 24   Calcium 8.9 - 10.3 mg/dL 8.5(L) 8.2(L) 9.2  Total Protein 6.5 - 8.1 g/dL 6.3(L) 6.3(L) 6.6  Total Bilirubin 0.3 - 1.2 mg/dL 1.0 0.9 1.1  Alkaline Phos 38 - 126 U/L 31(L) 31(L) 36(L)  AST 15 - 41 U/L 29 22 24   ALT 0 - 44 U/L 26 17 24       Component Value Date/Time   RBC 3.34 (L) 05/17/2020 1002   MCV 99.4 05/17/2020 1002   MCV 93 09/13/2013 1842   MCH 31.7 05/17/2020 1002   MCHC 31.9 05/17/2020 1002   RDW 14.6 05/17/2020 1002   RDW 14.1 09/13/2013 1842   LYMPHSABS 1.0 05/17/2020 1002   LYMPHSABS 0.8 (L) 09/13/2013 1842   MONOABS 0.2 05/17/2020 1002   MONOABS 0.4 09/13/2013 1842   EOSABS 0.1 05/17/2020 1002   EOSABS 0.0 09/13/2013 1842   BASOSABS 0.0 05/17/2020 1002   BASOSABS 0.0 09/13/2013 1842   Lab Results  Component Value Date   LDH 163 05/17/2020   LDH 152 04/13/2020   LDH 272 (H) 03/23/2020    DIAGNOSTIC IMAGING:  I have independently reviewed the scans and discussed with the patient. No results found.   ASSESSMENT:  1. 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  BCR/ABL by FISH on 02/23/2020 was positive for fusion protein. 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 -Chromosome analysis confirms Philadelphia chromosome. -BCR/ABL by quantitative PCR with B2 A2 transcript 132% -Dasatinib 50 mg daily started on 03/13/2020. - Sprycel dose reduced to 20 mg daily on 05/19/2020 secondary to leukopenia/neutropenia.  2.Right breast DCIS: -Screening mammogram on 02/03/2018 was abnormal. -Ultrasound of the right breast on 02/11/2018 shows mass measuring 0.5 x 0.4 x 0.5 cm, circumscribed hypoechoic in the 3:30  o'clock location of the right breast. Right axilla ultrasound was negative for adenopathy. -Right breast core biopsy on 02/18/2018 shows ductal carcinoma with papillary configuration. In the comment section, it notes that this may represent DCIS involving a papillary lesion or it may  represent an intracystic papillary ductal carcinoma. ER/PR was 95% positive. -Right breast lumpectomy on 03/12/2018, pathology showing intermediate grade DCIS, 0.6 cm, with areas showing papillary configuration. Posterior margin was 0.2 cm. No evidence of invasion. -Radiation therapy was refused. -Tamoxifen for 5 years started on 04/09/2018. -Mammogram on 02/16/2020 was BI-RADS Category 2.  3. Osteoporosis: -DEXA scan on 04/11/2018 with T score of -2.9. -She is following up with Dr. Nevada Crane for further management.   PLAN:  1. CML in chronic phase: -She is taking Sprycel 50 mg daily.  She complains of feeling very tired. - Reviewed labs from 05/17/2020.  White count is 2.0 with ANC of 0.7.  Platelet count is 131.  LDH was normal.  She does not feel nauseous at this time. - Because of cytopenias, I have recommended cutting back on the dose of Sprycel to 20 mg daily.  We will send a new prescription. - Until she gets 20 mg Sprycel, she will take 50 mg every other day. - RTC 5 weeks with repeat BCR/ABL by quantitative PCR in 4 weeks.  2. Hyperuricemia: -Uric acid, magnesium, phosphate and potassium are normal. -May discontinue allopurinol at this time.  Orders placed this encounter:  No orders of the defined types were placed in this encounter.    Derek Jack, MD [place] [phone]   I, Milinda Antis, am acting as a scribe for Dr. Sanda Linger.  I, Derek Jack MD, have reviewed the above documentation for accuracy and completeness, and I agree with the above.

## 2020-05-19 ENCOUNTER — Inpatient Hospital Stay (HOSPITAL_COMMUNITY): Payer: PPO | Admitting: Hematology

## 2020-05-19 ENCOUNTER — Other Ambulatory Visit (HOSPITAL_COMMUNITY): Payer: Self-pay

## 2020-05-19 ENCOUNTER — Other Ambulatory Visit: Payer: Self-pay

## 2020-05-19 ENCOUNTER — Other Ambulatory Visit (HOSPITAL_COMMUNITY): Payer: Self-pay | Admitting: Hematology

## 2020-05-19 VITALS — BP 129/87 | HR 72 | Temp 98.2°F | Resp 17 | Wt 151.4 lb

## 2020-05-19 DIAGNOSIS — C9210 Chronic myeloid leukemia, BCR/ABL-positive, not having achieved remission: Secondary | ICD-10-CM

## 2020-05-19 DIAGNOSIS — C9110 Chronic lymphocytic leukemia of B-cell type not having achieved remission: Secondary | ICD-10-CM | POA: Diagnosis not present

## 2020-05-19 MED ORDER — DASATINIB 20 MG PO TABS: 20.0000 mg | ORAL_TABLET | Freq: Every day | ORAL | 3 refills | Status: DC

## 2020-05-19 NOTE — Telephone Encounter (Signed)
20mg  Sprycel sent to [facility] pharmacy per Dr. Delton Coombes

## 2020-05-19 NOTE — Patient Instructions (Signed)
[facility] at [facility] Discharge Instructions  You were seen today by Dr. Katragadda. Follow up as scheduled.   Thank you for choosing [facility] at [facility] to provide your oncology and hematology care.  To afford each patient quality time with our provider, please arrive at least 15 minutes before your scheduled appointment time.   If you have a lab appointment with the Cancer Center please come in thru the Main Entrance and check in at the main information desk.  You need to re-schedule your appointment should you arrive 10 or more minutes late.  We strive to give you quality time with our providers, and arriving late affects you and other patients whose appointments are after yours.  Also, if you no show three or more times for appointments you may be dismissed from the clinic at the providers discretion.     Again, thank you for choosing [facility].  Our hope is that these requests will decrease the amount of time that you wait before being seen by our physicians.       _____________________________________________________________  Should you have questions after your visit to [facility], please contact our office at [phone] and follow the prompts.  Our office hours are 8:00 a.m. and 4:30 p.m. Monday - Friday.  Please note that voicemails left after 4:00 p.m. may not be returned until the following business day.  We are closed weekends and major holidays.  You do have access to a nurse 24-7, just call the main number to the clinic [phone] and do not press any options, hold on the line and a nurse will answer the phone.    For prescription refill requests, have your pharmacy contact our office and allow 72 hours.    Due to Covid, you will need to wear a mask upon entering the hospital. If you do not have a mask, a mask will be given to you at the Main Entrance upon arrival. For doctor visits, patients  may have 1 support person age 18 or older with them. For treatment visits, patients can not have anyone with them due to social distancing guidelines and our immunocompromised population.      

## 2020-05-20 ENCOUNTER — Other Ambulatory Visit (HOSPITAL_COMMUNITY): Payer: Self-pay | Admitting: Internal Medicine

## 2020-05-20 DIAGNOSIS — Z13820 Encounter for screening for osteoporosis: Secondary | ICD-10-CM

## 2020-05-26 ENCOUNTER — Inpatient Hospital Stay (HOSPITAL_COMMUNITY): Admission: RE | Admit: 2020-05-26 | Payer: PPO | Source: Ambulatory Visit

## 2020-05-26 ENCOUNTER — Ambulatory Visit (HOSPITAL_COMMUNITY)
Admission: RE | Admit: 2020-05-26 | Discharge: 2020-05-26 | Disposition: A | Discharge status: Home / Self Care | Payer: PPO | Source: Ambulatory Visit | Attending: Internal Medicine | Admitting: Internal Medicine

## 2020-05-26 ENCOUNTER — Other Ambulatory Visit: Payer: Self-pay

## 2020-05-26 DIAGNOSIS — Z853 Personal history of malignant neoplasm of breast: Secondary | ICD-10-CM | POA: Diagnosis not present

## 2020-05-26 DIAGNOSIS — Z780 Asymptomatic menopausal state: Secondary | ICD-10-CM | POA: Insufficient documentation

## 2020-05-26 DIAGNOSIS — R29890 Loss of height: Secondary | ICD-10-CM | POA: Diagnosis not present

## 2020-05-26 DIAGNOSIS — Z13820 Encounter for screening for osteoporosis: Secondary | ICD-10-CM | POA: Diagnosis not present

## 2020-05-26 DIAGNOSIS — M8589 Other specified disorders of bone density and structure, multiple sites: Secondary | ICD-10-CM | POA: Diagnosis not present

## 2020-06-16 ENCOUNTER — Other Ambulatory Visit: Payer: Self-pay

## 2020-06-16 ENCOUNTER — Inpatient Hospital Stay (HOSPITAL_COMMUNITY): Payer: PPO | Attending: Hematology

## 2020-06-16 DIAGNOSIS — Z800 Family history of malignant neoplasm of digestive organs: Secondary | ICD-10-CM | POA: Diagnosis not present

## 2020-06-16 DIAGNOSIS — Z79810 Long term (current) use of selective estrogen receptor modulators (SERMs): Secondary | ICD-10-CM | POA: Insufficient documentation

## 2020-06-16 DIAGNOSIS — Z86010 Personal history of colonic polyps: Secondary | ICD-10-CM | POA: Insufficient documentation

## 2020-06-16 DIAGNOSIS — E7800 Pure hypercholesterolemia, unspecified: Secondary | ICD-10-CM | POA: Diagnosis not present

## 2020-06-16 DIAGNOSIS — C9110 Chronic lymphocytic leukemia of B-cell type not having achieved remission: Secondary | ICD-10-CM | POA: Diagnosis not present

## 2020-06-16 DIAGNOSIS — M353 Polymyalgia rheumatica: Secondary | ICD-10-CM | POA: Diagnosis not present

## 2020-06-16 DIAGNOSIS — Z87440 Personal history of urinary (tract) infections: Secondary | ICD-10-CM | POA: Diagnosis not present

## 2020-06-16 DIAGNOSIS — Z170 Estrogen receptor positive status [ER+]: Secondary | ICD-10-CM | POA: Diagnosis not present

## 2020-06-16 DIAGNOSIS — Z79899 Other long term (current) drug therapy: Secondary | ICD-10-CM | POA: Diagnosis not present

## 2020-06-16 DIAGNOSIS — D0511 Intraductal carcinoma in situ of right breast: Secondary | ICD-10-CM | POA: Insufficient documentation

## 2020-06-16 DIAGNOSIS — C9210 Chronic myeloid leukemia, BCR/ABL-positive, not having achieved remission: Secondary | ICD-10-CM

## 2020-06-16 DIAGNOSIS — K219 Gastro-esophageal reflux disease without esophagitis: Secondary | ICD-10-CM | POA: Diagnosis not present

## 2020-06-16 LAB — COMPREHENSIVE METABOLIC PANEL
ALT: 26 U/L (ref 0–44)
AST: 30 U/L (ref 15–41)
Albumin: 3.9 g/dL (ref 3.5–5.0)
Alkaline Phosphatase: 32 U/L — ABNORMAL LOW (ref 38–126)
Anion gap: 8 (ref 5–15)
BUN: 15 mg/dL (ref 8–23)
CO2: 26 mmol/L (ref 22–32)
Calcium: 8.9 mg/dL (ref 8.9–10.3)
Chloride: 102 mmol/L (ref 98–111)
Creatinine, Ser: 0.63 mg/dL (ref 0.44–1.00)
GFR, Estimated: >60 mL/min (ref >60)
Glucose, Bld: 133 mg/dL — ABNORMAL HIGH (ref 70–99)
Potassium: 3.8 mmol/L (ref 3.5–5.1)
Sodium: 136 mmol/L (ref 135–145)
Total Bilirubin: 0.9 mg/dL (ref 0.3–1.2)
Total Protein: 6.4 g/dL — ABNORMAL LOW (ref 6.5–8.1)

## 2020-06-16 LAB — CBC WITH DIFFERENTIAL/PLATELET
Abs Immature Granulocytes: 0.01 10*3/uL (ref 0.00–0.07)
Basophils Absolute: 0 10*3/uL (ref 0.0–0.1)
Basophils Relative: 1 %
Eosinophils Absolute: 0.1 10*3/uL (ref 0.0–0.5)
Eosinophils Relative: 2 %
HCT: 34.4 % — ABNORMAL LOW (ref 36.0–46.0)
Hemoglobin: 11.2 g/dL — ABNORMAL LOW (ref 12.0–15.0)
Immature Granulocytes: 0 %
Lymphocytes Relative: 57 %
Lymphs Abs: 1.7 10*3/uL (ref 0.7–4.0)
MCH: 31.9 pg (ref 26.0–34.0)
MCHC: 32.6 g/dL (ref 30.0–36.0)
MCV: 98 fL (ref 80.0–100.0)
Monocytes Absolute: 0.2 10*3/uL (ref 0.1–1.0)
Monocytes Relative: 8 %
Neutro Abs: 0.9 10*3/uL — ABNORMAL LOW (ref 1.7–7.7)
Neutrophils Relative %: 32 %
Platelets: 177 10*3/uL (ref 150–400)
RBC: 3.51 MIL/uL — ABNORMAL LOW (ref 3.87–5.11)
RDW: 13.4 % (ref 11.5–15.5)
WBC: 2.9 10*3/uL — ABNORMAL LOW (ref 4.0–10.5)
nRBC: 0 % (ref 0.0–0.2)

## 2020-06-16 LAB — LACTATE DEHYDROGENASE: LDH: 153 U/L (ref 98–192)

## 2020-06-23 ENCOUNTER — Inpatient Hospital Stay (HOSPITAL_COMMUNITY): Payer: PPO | Admitting: Hematology

## 2020-06-23 ENCOUNTER — Other Ambulatory Visit: Payer: Self-pay

## 2020-06-23 VITALS — BP 144/51 | HR 73 | Temp 96.9°F | Resp 18 | Wt 150.6 lb

## 2020-06-23 DIAGNOSIS — C9210 Chronic myeloid leukemia, BCR/ABL-positive, not having achieved remission: Secondary | ICD-10-CM

## 2020-06-23 DIAGNOSIS — C9110 Chronic lymphocytic leukemia of B-cell type not having achieved remission: Secondary | ICD-10-CM | POA: Diagnosis not present

## 2020-06-23 LAB — BCR-ABL1, CML/ALL, PCR, QUANT: b2a2 transcript: 0.3588 %

## 2020-06-23 NOTE — Progress Notes (Signed)
[place], [place] [address]   CLINIC:  Medical Oncology/Hematology  PCP:  Celene Squibb, MD [address] [name] [place] [address]  [phone]  REASON FOR VISIT:  Follow-up for CML  PRIOR THERAPY: None  CURRENT THERAPY: Dasatinib 20 mg daily  INTERVAL HISTORY:  [name], a 86 y.o. female, returns for routine follow-up for her CML. [name] was last seen on 05/19/2020.  Today she reports feeling fair. Her energy levels have improved since she started dasatinib 20 mg on 01/20. She denies having any abdominal pain, N/V or leg swelling. She is taking tamoxifen daily and tolerating it well. She is wondering if she can start drinking Boost.   REVIEW OF SYSTEMS:  Review of Systems  Constitutional: Positive for appetite change (25%) and fatigue (75%; improved).  Cardiovascular: Negative for leg swelling.  Gastrointestinal: Negative for abdominal pain, nausea and vomiting.  Neurological: Positive for dizziness (off balance).  All other systems reviewed and are negative.   PAST MEDICAL/SURGICAL HISTORY:  Past Medical History:  Diagnosis Date  . Bladder infection, chronic   . Cancer ([place]) 02/2018   right breast cancer  . Complication of anesthesia   . GERD (gastroesophageal reflux disease)   . Hemorrhoids   . Hypercholesterolemia   . Polymyalgia rheumatica ([place])   . PONV (postoperative nausea and vomiting)   . S/P colonoscopy Jun 14, 2004   friable internal and external hemorrhoids, left-sided diverticula  . S/P endoscopy August 13, 2006   pale 1-3 mm nodules consistent with benign squamous papilloma, tiny hiatal hernia  . Serrated adenoma of colon    Past Surgical History:  Procedure Laterality Date  . ABDOMINAL HYSTERECTOMY    . BREAST LUMPECTOMY WITH RADIOACTIVE SEED LOCALIZATION Right 03/12/2018   Procedure: RIGHT BREAST LUMPECTOMY WITH RADIOACTIVE SEED LOCALIZATION;  Surgeon: Fanny Skates, MD;  Location: [facility];  Service: General;  Laterality: Right;  . CHOLECYSTECTOMY    . COLONOSCOPY  11/20/2010   Dr. Gala Romney- serrated adenomaL side diverticulosis, hemorrhoids  . COLONOSCOPY  06/14/04   friable internal and external hemorrhoids, left-sided diverticula  . COLONOSCOPY N/A 08/23/2016   Dr. Gala Romney: Diverticulosis, medium-sized grade 2 internal hemorrhoids.  . ESOPHAGOGASTRODUODENOSCOPY  08/13/06   pale 1-3 mm nodules consistent with benign squamous papilloma, tiny hiatal hernia  . ESOPHAGOGASTRODUODENOSCOPY N/A 08/23/2016   Dr. Gala Romney: Normal  . FLEXIBLE SIGMOIDOSCOPY  11/15/2011   Procedure: FLEXIBLE SIGMOIDOSCOPY;  Surgeon: Daneil Dolin, MD;  [facility];  Service: Endoscopy;  Laterality: N/A;  12:30PM  . WRIST FRACTURE SURGERY Right     SOCIAL HISTORY:  Social History   Socioeconomic History  . Marital status: Widowed    Spouse name: Not on file  . Number of children: 2  . Years of education: Not on file  . Highest education level: Not on file  Occupational History  . Not on file  Tobacco Use  . Smoking status: Never Smoker  . Smokeless tobacco: Never Used  Vaping Use  . Vaping Use: Never used  Substance and Sexual Activity  . Alcohol use: Yes    Alcohol/week: 2.0 standard drinks    Types: 2 Glasses of wine per week    Comment: Drinks wine 2-3 days per week ( usually a couple of glasses each time)  . Drug use: No  . Sexual activity: Not on file  Other Topics Concern  . Not on file  Social History Narrative  .  Not on file   Social Determinants of Health   Financial Resource Strain: Low Risk   . Difficulty of Paying Living Expenses: Not hard at all  Food Insecurity: No Food Insecurity  . Worried About Charity fundraiser in the Last Year: Never true  . Ran Out of Food in the Last Year: Never true  Transportation Needs: No Transportation Needs  . Lack of Transportation (Medical): No  . Lack of Transportation (Non-Medical): No  Physical Activity:  Insufficiently Active  . Days of Exercise per Week: 7 days  . Minutes of Exercise per Session: 20 min  Stress: No Stress Concern Present  . Feeling of Stress : Not at all  Social Connections: Moderately Isolated  . Frequency of Communication with Friends and Family: Twice a week  . Frequency of Social Gatherings with Friends and Family: Twice a week  . Attends Religious Services: More than 4 times per year  . Active Member of Clubs or Organizations: No  . Attends Archivist Meetings: Never  . Marital Status: Widowed  Intimate Partner Violence: Not At Risk  . Fear of Current or Ex-Partner: No  . Emotionally Abused: No  . Physically Abused: No  . Sexually Abused: No    FAMILY HISTORY:  Family History  Problem Relation Age of Onset  . Thyroid cancer Mother   . Parkinson's disease Sister   . Thyroid disease Brother   . Aneurysm Sister   . Thyroid cancer Other   . Thyroid cancer Other   . Colon cancer Neg Hx     CURRENT MEDICATIONS:  Current Outpatient Medications  Medication Sig Dispense Refill  . Calcium Carb-Cholecalciferol (CALCIUM 600 + D PO) Take 1 tablet by mouth daily.     . dasatinib (SPRYCEL) 20 MG tablet Take 1 tablet (20 mg total) by mouth daily. 30 tablet 3  . magnesium 30 MG tablet Take 500 mg by mouth daily.    . Multiple Vitamin (MULTIVITAMIN WITH MINERALS) TABS tablet Take 1 tablet by mouth daily.    . polyethylene glycol (MIRALAX / GLYCOLAX) packet Take 17 g by mouth daily as needed.     . tamoxifen (NOLVADEX) 20 MG tablet TAKE 1 TABLET BY MOUTH DAILY. 90 tablet 3  . Wheat Dextrin (BENEFIBER) POWD Take by mouth 2 (two) times daily. Takes 1 tbsp twice a day     No current facility-administered medications for this visit.    ALLERGIES:  Allergies  Allergen Reactions  . Fish Allergy Nausea And Vomiting  . Shrimp [Shellfish Allergy] Nausea And Vomiting    PHYSICAL EXAM:  Performance status (ECOG): 1 - Symptomatic but completely  ambulatory  Vitals:   06/23/20 1251  BP: (!) 144/51  Pulse: 73  Resp: 18  Temp: (!) 96.9 F (36.1 C)  SpO2: 97%   Wt Readings from Last 3 Encounters:  06/23/20 150 lb 9.6 oz (68.3 kg)  05/19/20 151 lb 7 oz (68.7 kg)  04/19/20 152 lb 7 oz (69.1 kg)   Physical Exam Vitals reviewed.  Constitutional:      Appearance: Normal appearance.  Cardiovascular:     Rate and Rhythm: Normal rate and regular rhythm.     Pulses: Normal pulses.     Heart sounds: Normal heart sounds.  Pulmonary:     Effort: Pulmonary effort is normal.     Breath sounds: Normal breath sounds.  Musculoskeletal:     Right lower leg: No edema.     Left lower leg: No edema.  Neurological:     General: No focal deficit present.     Mental Status: She is alert and oriented to person, place, and time.  Psychiatric:        Mood and Affect: Mood normal.        Behavior: Behavior normal.     LABORATORY DATA:  I have reviewed the labs as listed.  CBC Latest Ref Rng & Units 06/16/2020 05/17/2020 05/03/2020  WBC 4.0 - 10.5 K/uL 2.9(L) 2.0(L) 2.0(L)  Hemoglobin 12.0 - 15.0 g/dL 11.2(L) 10.6(L) 10.7(L)  Hematocrit 36.0 - 46.0 % 34.4(L) 33.2(L) 33.3(L)  Platelets 150 - 400 K/uL 177 131(L) 187   CMP Latest Ref Rng & Units 06/16/2020 05/17/2020 04/13/2020  Glucose 70 - 99 mg/dL 133(H) 101(H) 101(H)  BUN 8 - 23 mg/dL 15 14 11   Creatinine 0.44 - 1.00 mg/dL 0.63 0.69 0.65  Sodium 135 - 145 mmol/L 136 138 137  Potassium 3.5 - 5.1 mmol/L 3.8 3.8 3.5  Chloride 98 - 111 mmol/L 102 107 106  CO2 22 - 32 mmol/L 26 25 25   Calcium 8.9 - 10.3 mg/dL 8.9 8.5(L) 8.2(L)  Total Protein 6.5 - 8.1 g/dL 6.4(L) 6.3(L) 6.3(L)  Total Bilirubin 0.3 - 1.2 mg/dL 0.9 1.0 0.9  Alkaline Phos 38 - 126 U/L 32(L) 31(L) 31(L)  AST 15 - 41 U/L 30 29 22   ALT 0 - 44 U/L 26 26 17       Component Value Date/Time   RBC 3.51 (L) 06/16/2020 1307   MCV 98.0 06/16/2020 1307   MCV 93 09/13/2013 1842   MCH 31.9 06/16/2020 1307   MCHC 32.6 06/16/2020 1307    RDW 13.4 06/16/2020 1307   RDW 14.1 09/13/2013 1842   LYMPHSABS 1.7 06/16/2020 1307   LYMPHSABS 0.8 (L) 09/13/2013 1842   MONOABS 0.2 06/16/2020 1307   MONOABS 0.4 09/13/2013 1842   EOSABS 0.1 06/16/2020 1307   EOSABS 0.0 09/13/2013 1842   BASOSABS 0.0 06/16/2020 1307   BASOSABS 0.0 09/13/2013 1842   Lab Results  Component Value Date   LDH 153 06/16/2020   LDH 163 05/17/2020   LDH 152 04/13/2020    DIAGNOSTIC IMAGING:  I have independently reviewed the scans and discussed with the patient. DG BONE DENSITY (DXA)  Result Date: 05/26/2020 EXAM: DUAL X-RAY ABSORPTIOMETRY (DXA) FOR BONE MINERAL DENSITY IMPRESSION: Your patient Bronwen Pendergraft completed a BMD test on 05/26/2020 using the [place] (software version: 14.10) manufactured by [employer]. The following summarizes the results of our evaluation. Technologist: [facility] PATIENT BIOGRAPHICAL: Name: [name], [name] Patient ID: [id number] Birth Date: 1934/08/15 Height: 67.5 in. Gender: Female Exam Date: 05/26/2020 Weight: 151.4 lbs. Indications: Advanced Age, Bilateral Oophrectomy, Caucasian, Follow up Osteopenia, Follow up Osteoporosis, Height Loss, History of Fracture (Adult), Hx Breast Ca, Post Menopausal Fractures: Wrist Treatments: Calcium, Multivitamin, Tamoxifen, Vitamin D DENSITOMETRY RESULTS: Site      Region      Measured Date Measured Age WHO Classification Young Adult T-score BMD         %Change vs. Previous Significant Change (*) AP Spine L1-L2 05/26/2020 83.2 Osteopenia -1.5 0.980 g/cm2 6.4% Yes AP Spine L1-L2 04/21/2018 81.1 Osteopenia -2.0 0.921 g/cm2 -0.8% - AP Spine L1-L2 01/18/2016 78.8 Osteopenia -2.0 0.928 g/cm2 9.6% Yes AP Spine L1-L2 10/23/2010 73.6 Osteoporosis -2.6 0.847 g/cm2 -0.8% - AP Spine L1-L2 06/18/2006 69.2 Osteoporosis -2.6 0.854 g/cm2 1.3% - AP Spine L1-L2 10/26/2004 67.6 Osteoporosis -2.7 0.843 g/cm2 1.6% - AP Spine L1-L2 04/24/2002 65.1 Osteoporosis -2.8  0.830 g/cm2 4.8% - AP Spine L1-L2  10/17/2001 64.6 Osteoporosis -3.1 0.792 g/cm2 - - DualFemur Total Right 05/26/2020 83.2 Osteopenia -2.3 0.719 g/cm2 7.5% Yes DualFemur Total Right 04/21/2018 81.1 Osteoporosis -2.7 0.669 g/cm2 -5.8% Yes DualFemur Total Right 01/18/2016 78.8 Osteopenia -2.4 0.710 g/cm2 -0.6% - DualFemur Total Right 10/23/2010 73.6 Osteopenia -2.3 0.714 g/cm2 -1.5% - DualFemur Total Right 06/18/2006 69.2 Osteopenia -2.2 0.725 g/cm2 3.3% Yes DualFemur Total Right 10/26/2004 67.6 Osteopenia -2.4 0.702 g/cm2 1.2% - DualFemur Total Right 04/24/2002 65.1 Osteoporosis -2.5 0.694 g/cm2 2.7% - DualFemur Total Right 10/17/2001 64.6 Osteoporosis -2.6 0.676 g/cm2 - - DualFemur Total Mean 05/26/2020 83.2 Osteopenia -2.2 0.732 g/cm2 4.0% Yes DualFemur Total Mean 04/21/2018 81.1 Osteopenia -2.4 0.704 g/cm2 -1.3% - DualFemur Total Mean 01/18/2016 78.8 Osteopenia -2.3 0.713 g/cm2 -1.4% - DualFemur Total Mean 10/23/2010 73.6 Osteopenia -2.3 0.723 g/cm2 0.0% - DualFemur Total Mean 06/18/2006 69.2 Osteopenia -2.3 0.723 g/cm2 3.3% Yes DualFemur Total Mean 10/26/2004 67.6 Osteopenia -2.4 0.700 g/cm2 0.7% - DualFemur Total Mean 04/24/2002 65.1 Osteoporosis -2.5 0.695 g/cm2 1.9% - DualFemur Total Mean 10/17/2001 64.6 Osteoporosis -2.6 0.682 g/cm2 - - ASSESSMENT: The BMD measured at Femur Total Right is 0.719 g/cm2 with a T-score of -2.3. This patient is considered OSTEOPENIC according to [facility]) criteria. The scan quality is good. Compared with the prior study on 04/21/2018, the BMD of the total mean and AP spine show a statistically significant increase. L3 and L4 were excluded due to advanced degenerative changes. World Pharmacologist [facility]) criteria for post-menopausal, Caucasian Women: Normal:       T-score at or above -1 SD Osteopenia:   T-score between -1 and -2.5 SD Osteoporosis: T-score at or below -2.5 SD RECOMMENDATIONS: 1. All patients should optimize calcium and vitamin D intake. 2. Consider FDA-approved medical  therapies in postmenopausal women and med aged 31 years and older, based on the following: a. A hip or vertebral (clinical or morphometric) fracture b. T-score < -2.5 at the femoral neck or spine after appropriate evaluation to exclude secondary causes c. Low bone mass (T-score between -1.0 and -2.5 at the femoral neck or spine) and a 10-year probability of a hip fracture > 3% or a 10-year probability of a major osteoporosis-related fracture > 20% based on the [place]-adapted WHO algorithm d. Clinician judgment and/or patient preferences may indicate treatment for people with 10-year fracture probabilities above or below these levels FOLLOW-UP: People with diagnosed cases of osteoporosis or at high risk for fracture should have regular bone mineral density tests. For patients eligible for Medicare, routine testing is allowed once every 2 years. The testing frequency can be increased to one year for patients who have rapidly progressing disease, those who are receiving or discontinuing medical therapy to restore bone mass, or have additional risk factors. I have reviewed this report, and agree with the above findings. Mark A. Thornton Papas, M.D. [facility] Your patient [name] completed a FRAX assessment on 05/26/2020 using the [place] (analysis version: 14.10) manufactured by [employer]. The following summarizes the results of our evaluation. PATIENT BIOGRAPHICAL: Name: [name], [name] Patient ID: [id number] Birth Date: 11/03/1934 Height:    67.5 in. Gender:     [age]    [age]       Weight:    151.4 lbs. Ethnicity:  White  Exam Date: 05/26/2020 FRAX* RESULTS:  (version: 3.5) 10-year Probability of Fracture1 Major Osteoporotic Fracture2 Hip Fracture 23.7% 7.7% Population: [place] (Caucasian) Risk Factors: History of Fracture (Adult) Based on Femur (Right) Neck BMD 1 -The 10-year probability of fracture may be lower than reported if the patient has received  treatment. 2 -Major Osteoporotic Fracture: Clinical Spine, Forearm, Hip or Shoulder *FRAX is a Materials engineer of the [employer] for Metabolic Bone Disease, a [place] (WHO) [facility]. ASSESSMENT: The probability of a major osteoporotic fracture is 23.7% within the next ten years. The probability of a hip fracture is 7.7% within the next ten years. I have reviewed this report and agree with the above findings. Mark A. Thornton Papas, M.D. [facility] Electronically Signed   By: Lavonia Dana M.D.   On: 05/26/2020 16:32     ASSESSMENT:  1. 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 BCR/ABL by FISH on 02/23/2020 was positive for fusion protein. 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 -Chromosome analysis confirms Philadelphia chromosome. -BCR/ABL by quantitative PCR with B2 A2 transcript 132% -Dasatinib 50 mg daily started on 03/13/2020. - Sprycel dose reduced to 20 mg daily on 05/19/2020 secondary to leukopenia/neutropenia.  2.Right breast DCIS: -Screening mammogram on 02/03/2018 was abnormal. -Ultrasound of the right breast on 02/11/2018 shows mass measuring 0.5 x 0.4 x 0.5 cm, circumscribed hypoechoic in the 3:30 o'clock location of the right breast. Right axilla ultrasound was negative for adenopathy. -Right breast core biopsy on 02/18/2018 shows ductal carcinoma with papillary configuration. In the comment section, it notes that this may represent DCIS involving a papillary lesion or it may represent an intracystic papillary ductal carcinoma. ER/PR was 95% positive. -Right breast lumpectomy on 03/12/2018, pathology showing intermediate grade DCIS, 0.6 cm, with areas showing papillary configuration. Posterior margin was 0.2 cm. No evidence of invasion. -Radiation therapy was refused. -Tamoxifen for 5 years started on 04/09/2018. -Mammogram on 02/16/2020 was BI-RADS Category  2.  3. Osteoporosis: -DEXA scan on 04/11/2018 with T score of -2.9. -DEXA scan on 05/26/2020 with T score -2.3.   PLAN:  1. CML in chronic phase: -Sprycel dose was decreased to 20 mg daily around 05/21/2020.  She reports that she is tolerating it very well. -Her energy levels also improved. -Reviewed labs from 06/16/2020.  White count is 2.9 with ANC of 900.  Platelet count is 177 and hemoglobin is 11.2.  LDH is normal. -Physical examination did not reveal any abnormalities. -BCR/ABL shows B2 A2 transcript of 0.3588. -We will continue Sprycel at the same dose at this time because of better tolerability.  RTC 6 weeks for further evaluation.  2.  Right breast DCIS: -Continue tamoxifen.  She will have mammogram in October. -Continue calcium and vitamin D supplements.  Orders placed this encounter:  No orders of the defined types were placed in this encounter.    Derek Jack, MD [place] [phone]   I, Milinda Antis, am acting as a scribe for Dr. Sanda Linger.  I, Derek Jack MD, have reviewed the above documentation for accuracy and completeness, and I agree with the above.

## 2020-06-23 NOTE — Patient Instructions (Signed)
[place] at [facility] Discharge Instructions  You were seen today by Dr. Delton Coombes. He went over your recent results. Continue taking dasatinib 20 mg daily. If you develop sudden shortness of breath, please call the office immediately. You may start drinking 1-2 cans of Boost or Ensure daily, though first try to eat regular meals before drinking the cans. Dr. Delton Coombes will see you back in 6 weeks for labs and follow up.   Thank you for choosing [place] at [facility] to provide your oncology and hematology care.  To afford each patient quality time with our provider, please arrive at least 15 minutes before your scheduled appointment time.   If you have a lab appointment with the [facility] please come in thru the Main Entrance and check in at the main information desk  You need to re-schedule your appointment should you arrive 10 or more minutes late.  We strive to give you quality time with our providers, and arriving late affects you and other patients whose appointments are after yours.  Also, if you no show three or more times for appointments you may be dismissed from the clinic at the providers discretion.     Again, thank you for choosing [facility].  Our hope is that these requests will decrease the amount of time that you wait before being seen by our physicians.       _____________________________________________________________  Should you have questions after your visit to [facility] please contact our office at [phone] between the hours of 8:00 a.m. and 4:30 p.m.  Voicemails left after 4:00 p.m. will not be returned until the following business day.  For prescription refill requests, have your pharmacy contact our office and allow 72 hours.    Cancer Center Support Programs:   > Cancer Support Group  2nd Tuesday of the month 1pm-2pm, Journey Room

## 2020-07-04 ENCOUNTER — Other Ambulatory Visit (HOSPITAL_COMMUNITY): Payer: Self-pay | Admitting: Internal Medicine

## 2020-07-04 DIAGNOSIS — M353 Polymyalgia rheumatica: Secondary | ICD-10-CM | POA: Diagnosis not present

## 2020-07-04 DIAGNOSIS — E7849 Other hyperlipidemia: Secondary | ICD-10-CM | POA: Diagnosis not present

## 2020-07-04 DIAGNOSIS — Z13820 Encounter for screening for osteoporosis: Secondary | ICD-10-CM

## 2020-07-04 DIAGNOSIS — M791 Myalgia, unspecified site: Secondary | ICD-10-CM | POA: Diagnosis not present

## 2020-07-04 DIAGNOSIS — E782 Mixed hyperlipidemia: Secondary | ICD-10-CM | POA: Diagnosis not present

## 2020-08-01 ENCOUNTER — Other Ambulatory Visit (HOSPITAL_COMMUNITY): Payer: PPO

## 2020-08-03 DIAGNOSIS — R030 Elevated blood-pressure reading, without diagnosis of hypertension: Secondary | ICD-10-CM | POA: Diagnosis not present

## 2020-08-03 DIAGNOSIS — N390 Urinary tract infection, site not specified: Secondary | ICD-10-CM | POA: Diagnosis not present

## 2020-08-03 DIAGNOSIS — E782 Mixed hyperlipidemia: Secondary | ICD-10-CM | POA: Diagnosis not present

## 2020-08-03 DIAGNOSIS — M19049 Primary osteoarthritis, unspecified hand: Secondary | ICD-10-CM | POA: Diagnosis not present

## 2020-08-04 ENCOUNTER — Telehealth (HOSPITAL_COMMUNITY): Payer: Self-pay

## 2020-08-04 ENCOUNTER — Other Ambulatory Visit (HOSPITAL_COMMUNITY): Payer: Self-pay

## 2020-08-04 NOTE — Telephone Encounter (Signed)
This patient called and stated that she has very bad mouth sores and mouth pain on the rough of her mouth all the way to the back of her mouth and in her jaws.  This nurse called in an order for [place] for  swish and swallow per standing order of Dr. Delton Coombes.  This nurse returned call to patient, no answer, message left about prescription and location.  Pharmacy will call when order is ready for pick up.

## 2020-08-08 ENCOUNTER — Ambulatory Visit (HOSPITAL_COMMUNITY): Payer: PPO | Admitting: Hematology

## 2020-08-17 ENCOUNTER — Inpatient Hospital Stay (HOSPITAL_COMMUNITY): Payer: PPO | Attending: Hematology

## 2020-08-17 ENCOUNTER — Other Ambulatory Visit: Payer: Self-pay

## 2020-08-17 DIAGNOSIS — R5383 Other fatigue: Secondary | ICD-10-CM | POA: Insufficient documentation

## 2020-08-17 DIAGNOSIS — K219 Gastro-esophageal reflux disease without esophagitis: Secondary | ICD-10-CM | POA: Insufficient documentation

## 2020-08-17 DIAGNOSIS — M353 Polymyalgia rheumatica: Secondary | ICD-10-CM | POA: Diagnosis not present

## 2020-08-17 DIAGNOSIS — D0511 Intraductal carcinoma in situ of right breast: Secondary | ICD-10-CM | POA: Insufficient documentation

## 2020-08-17 DIAGNOSIS — Z800 Family history of malignant neoplasm of digestive organs: Secondary | ICD-10-CM | POA: Diagnosis not present

## 2020-08-17 DIAGNOSIS — Z170 Estrogen receptor positive status [ER+]: Secondary | ICD-10-CM | POA: Insufficient documentation

## 2020-08-17 DIAGNOSIS — C9210 Chronic myeloid leukemia, BCR/ABL-positive, not having achieved remission: Secondary | ICD-10-CM | POA: Diagnosis not present

## 2020-08-17 DIAGNOSIS — Z79810 Long term (current) use of selective estrogen receptor modulators (SERMs): Secondary | ICD-10-CM | POA: Insufficient documentation

## 2020-08-17 DIAGNOSIS — M810 Age-related osteoporosis without current pathological fracture: Secondary | ICD-10-CM | POA: Diagnosis not present

## 2020-08-17 DIAGNOSIS — E7800 Pure hypercholesterolemia, unspecified: Secondary | ICD-10-CM | POA: Diagnosis not present

## 2020-08-17 DIAGNOSIS — R232 Flushing: Secondary | ICD-10-CM | POA: Insufficient documentation

## 2020-08-17 LAB — CBC WITH DIFFERENTIAL/PLATELET
Abs Immature Granulocytes: 0.01 10*3/uL (ref 0.00–0.07)
Basophils Absolute: 0 10*3/uL (ref 0.0–0.1)
Basophils Relative: 1 %
Eosinophils Absolute: 0.1 10*3/uL (ref 0.0–0.5)
Eosinophils Relative: 3 %
HCT: 34.4 % — ABNORMAL LOW (ref 36.0–46.0)
Hemoglobin: 11.3 g/dL — ABNORMAL LOW (ref 12.0–15.0)
Immature Granulocytes: 1 %
Lymphocytes Relative: 50 %
Lymphs Abs: 1.1 10*3/uL (ref 0.7–4.0)
MCH: 32.6 pg (ref 26.0–34.0)
MCHC: 32.8 g/dL (ref 30.0–36.0)
MCV: 99.1 fL (ref 80.0–100.0)
Monocytes Absolute: 0.2 10*3/uL (ref 0.1–1.0)
Monocytes Relative: 11 %
Neutro Abs: 0.8 10*3/uL — ABNORMAL LOW (ref 1.7–7.7)
Neutrophils Relative %: 34 %
Platelets: 174 10*3/uL (ref 150–400)
RBC: 3.47 MIL/uL — ABNORMAL LOW (ref 3.87–5.11)
RDW: 14.6 % (ref 11.5–15.5)
WBC: 2.2 10*3/uL — ABNORMAL LOW (ref 4.0–10.5)
nRBC: 0 % (ref 0.0–0.2)

## 2020-08-17 LAB — LACTATE DEHYDROGENASE: LDH: 180 U/L (ref 98–192)

## 2020-08-17 LAB — COMPREHENSIVE METABOLIC PANEL
ALT: 24 U/L (ref 0–44)
AST: 30 U/L (ref 15–41)
Albumin: 4.2 g/dL (ref 3.5–5.0)
Alkaline Phosphatase: 31 U/L — ABNORMAL LOW (ref 38–126)
Anion gap: 8 (ref 5–15)
BUN: 19 mg/dL (ref 8–23)
CO2: 24 mmol/L (ref 22–32)
Calcium: 8.6 mg/dL — ABNORMAL LOW (ref 8.9–10.3)
Chloride: 105 mmol/L (ref 98–111)
Creatinine, Ser: 0.79 mg/dL (ref 0.44–1.00)
GFR, Estimated: >60 mL/min (ref >60)
Glucose, Bld: 102 mg/dL — ABNORMAL HIGH (ref 70–99)
Potassium: 4.1 mmol/L (ref 3.5–5.1)
Sodium: 137 mmol/L (ref 135–145)
Total Bilirubin: 1.2 mg/dL (ref 0.3–1.2)
Total Protein: 6.7 g/dL (ref 6.5–8.1)

## 2020-08-17 LAB — MAGNESIUM: Magnesium: 2.2 mg/dL (ref 1.7–2.4)

## 2020-08-18 ENCOUNTER — Inpatient Hospital Stay (HOSPITAL_COMMUNITY): Payer: PPO | Admitting: Hematology

## 2020-08-18 ENCOUNTER — Other Ambulatory Visit: Payer: Self-pay

## 2020-08-18 VITALS — BP 130/38 | HR 69 | Temp 97.0°F | Resp 18 | Wt 154.8 lb

## 2020-08-18 DIAGNOSIS — D0511 Intraductal carcinoma in situ of right breast: Secondary | ICD-10-CM | POA: Diagnosis not present

## 2020-08-18 DIAGNOSIS — C9210 Chronic myeloid leukemia, BCR/ABL-positive, not having achieved remission: Secondary | ICD-10-CM

## 2020-08-18 NOTE — Progress Notes (Signed)
[place], [place] [address]   CLINIC:  Medical Oncology/Hematology  PCP:  Celene Squibb, MD 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, a 86 y.o. female, returns for routine follow-up for her CML and right breast DCIS. Shauntee was last seen on 06/23/2020.  Today she reports feeling fair. She complains of her energy levels still being decreased, though she is tolerating the dasatinib well. She can only work for 3-4 hours in her garden before she becomes too tired to continue. She reports that she felt more fatigued on 04/11 and her legs felt weak, but it resolved by the afternoon. She is also taking tamoxifen along with 2 tablets of calcium/vitamin D; she reports having occasional hot flashes.   REVIEW OF SYSTEMS:  Review of Systems  Constitutional: Positive for appetite change (50%) and fatigue (50%).  Cardiovascular: Positive for palpitations.  Endocrine: Positive for hot flashes (occasional).  Neurological: Positive for dizziness.  Psychiatric/Behavioral: Positive for depression and sleep disturbance. The patient is nervous/anxious.   All other systems reviewed and are negative.   PAST MEDICAL/SURGICAL HISTORY:  Past Medical History:  Diagnosis Date  . Bladder infection, chronic   . Cancer ([place]) 02/2018   right breast cancer  . Complication of anesthesia   . GERD (gastroesophageal reflux disease)   . Hemorrhoids   . Hypercholesterolemia   . Polymyalgia rheumatica ([place])   . PONV (postoperative nausea and vomiting)   . S/P colonoscopy Jun 14, 2004   friable internal and external hemorrhoids, left-sided diverticula  . S/P endoscopy August 13, 2006   pale 1-3 mm nodules consistent with benign squamous papilloma, tiny hiatal hernia  . Serrated adenoma of colon     Past Surgical History:  Procedure Laterality Date  . ABDOMINAL HYSTERECTOMY    . BREAST LUMPECTOMY WITH RADIOACTIVE SEED LOCALIZATION Right 03/12/2018   Procedure: RIGHT BREAST LUMPECTOMY WITH RADIOACTIVE SEED LOCALIZATION;  Surgeon: Fanny Skates, MD;  Location: [facility];  Service: General;  Laterality: Right;  . CHOLECYSTECTOMY    . COLONOSCOPY  11/20/2010   Dr. Gala Romney- serrated adenomaL side diverticulosis, hemorrhoids  . COLONOSCOPY  06/14/04   friable internal and external hemorrhoids, left-sided diverticula  . COLONOSCOPY N/A 08/23/2016   Dr. Gala Romney: Diverticulosis, medium-sized grade 2 internal hemorrhoids.  . ESOPHAGOGASTRODUODENOSCOPY  08/13/06   pale 1-3 mm nodules consistent with benign squamous papilloma, tiny hiatal hernia  . ESOPHAGOGASTRODUODENOSCOPY N/A 08/23/2016   Dr. Gala Romney: Normal  . FLEXIBLE SIGMOIDOSCOPY  11/15/2011   Procedure: FLEXIBLE SIGMOIDOSCOPY;  Surgeon: Daneil Dolin, MD;  [facility];  Service: Endoscopy;  Laterality: N/A;  12:30PM  . WRIST FRACTURE SURGERY Right     SOCIAL HISTORY:  Social History   Socioeconomic History  . Marital status: Widowed    Spouse name: Not on file  . Number of children: 2  . Years of education: Not on file  . Highest education level: Not on file  Occupational History  . Not on file  Tobacco Use  . Smoking status: Never Smoker  . Smokeless tobacco: Never Used  Vaping Use  . Vaping Use: Never used  Substance and Sexual Activity  . Alcohol use: Yes    Alcohol/week: 2.0 standard drinks    Types: 2 Glasses of wine  per week    Comment: Drinks wine 2-3 days per week ( usually a couple of glasses each time)  . Drug use: No  . Sexual activity: Not on file  Other Topics Concern  . Not on file  Social History Narrative  . Not on file   Social Determinants of Health   Financial Resource Strain: Low Risk   . Difficulty of Paying Living Expenses: Not hard at all  Food Insecurity: No Food  Insecurity  . Worried About Charity fundraiser in the Last Year: Never true  . Ran Out of Food in the Last Year: Never true  Transportation Needs: No Transportation Needs  . Lack of Transportation (Medical): No  . Lack of Transportation (Non-Medical): No  Physical Activity: Insufficiently Active  . Days of Exercise per Week: 7 days  . Minutes of Exercise per Session: 20 min  Stress: No Stress Concern Present  . Feeling of Stress : Not at all  Social Connections: Moderately Isolated  . Frequency of Communication with Friends and Family: Twice a week  . Frequency of Social Gatherings with Friends and Family: Twice a week  . Attends Religious Services: More than 4 times per year  . Active Member of Clubs or Organizations: No  . Attends Archivist Meetings: Never  . Marital Status: Widowed  Intimate Partner Violence: Not At Risk  . Fear of Current or Ex-Partner: No  . Emotionally Abused: No  . Physically Abused: No  . Sexually Abused: No    FAMILY HISTORY:  Family History  Problem Relation Age of Onset  . Thyroid cancer Mother   . Parkinson's disease Sister   . Thyroid disease Brother   . Aneurysm Sister   . Thyroid cancer Other   . Thyroid cancer Other   . Colon cancer Neg Hx     CURRENT MEDICATIONS:  Current Outpatient Medications  Medication Sig Dispense Refill  . Calcium Carb-Cholecalciferol (CALCIUM 600 + D PO) Take 1 tablet by mouth daily.     . dasatinib (SPRYCEL) 20 MG tablet TAKE 1 TABLET (20 MG TOTAL) BY MOUTH DAILY. 30 tablet 3  . magnesium 30 MG tablet Take 500 mg by mouth daily.    . Multiple Vitamin (MULTIVITAMIN WITH MINERALS) TABS tablet Take 1 tablet by mouth daily.    . polyethylene glycol (MIRALAX / GLYCOLAX) packet Take 17 g by mouth daily as needed.     . tamoxifen (NOLVADEX) 20 MG tablet TAKE 1 TABLET BY MOUTH DAILY. 90 tablet 3  . Wheat Dextrin (BENEFIBER) POWD Take by mouth 2 (two) times daily. Takes 1 tbsp twice a day     No current  facility-administered medications for this visit.    ALLERGIES:  Allergies  Allergen Reactions  . Fish Allergy Nausea And Vomiting  . Shrimp [Shellfish Allergy] Nausea And Vomiting    PHYSICAL EXAM:  Performance status (ECOG): 1 - Symptomatic but completely ambulatory  Vitals:   08/18/20 0805  BP: (!) 130/38  Pulse: 69  Resp: 18  Temp: (!) 97 F (36.1 C)  SpO2: 98%   Wt Readings from Last 3 Encounters:  08/18/20 154 lb 12.8 oz (70.2 kg)  06/23/20 150 lb 9.6 oz (68.3 kg)  05/19/20 151 lb 7 oz (68.7 kg)   Physical Exam Vitals reviewed.  Constitutional:      Appearance: Normal appearance.  Neck:     Thyroid: No thyroid mass or thyroid tenderness.  Cardiovascular:     Rate and Rhythm: Normal rate  and regular rhythm.     Pulses: Normal pulses.     Heart sounds: Normal heart sounds.  Pulmonary:     Effort: Pulmonary effort is normal.     Breath sounds: Normal breath sounds.  Chest:  Breasts:     Right: Normal. No swelling, bleeding, inverted nipple, mass, nipple discharge, skin change (UIQ scar well-healed), tenderness, axillary adenopathy or supraclavicular adenopathy.     Left: Tenderness (UOQ TTP) present. No swelling, bleeding, inverted nipple, mass, nipple discharge or skin change.    Abdominal:     Palpations: Abdomen is soft. There is no hepatomegaly, splenomegaly or mass.     Tenderness: There is no abdominal tenderness.  Lymphadenopathy:     Head:     Right side of head: No submental or submandibular adenopathy.     Left side of head: No submental or submandibular adenopathy.     Cervical: No cervical adenopathy.     Right cervical: No superficial cervical adenopathy.    Left cervical: No superficial cervical adenopathy.     Upper Body:     Right upper body: No supraclavicular, axillary or pectoral adenopathy.  Neurological:     General: No focal deficit present.     Mental Status: She is alert and oriented to person, place, and time.  Psychiatric:         Mood and Affect: Mood normal.        Behavior: Behavior normal.     LABORATORY DATA:  I have reviewed the labs as listed.  CBC Latest Ref Rng & Units 08/17/2020 06/16/2020 05/17/2020  WBC 4.0 - 10.5 K/uL 2.2(L) 2.9(L) 2.0(L)  Hemoglobin 12.0 - 15.0 g/dL 11.3(L) 11.2(L) 10.6(L)  Hematocrit 36.0 - 46.0 % 34.4(L) 34.4(L) 33.2(L)  Platelets 150 - 400 K/uL 174 177 131(L)   CMP Latest Ref Rng & Units 08/17/2020 06/16/2020 05/17/2020  Glucose 70 - 99 mg/dL 102(H) 133(H) 101(H)  BUN 8 - 23 mg/dL _0 Creatinine 0.44 - 1.00 mg/dL 0.79 0.63 0.69  Sodium 135 - 145 mmol/L 137 136 138  Potassium 3.5 - 5.1 mmol/L 4.1 3.8 3.8  Chloride 98 - 111 mmol/L 105 102 107  CO2 22 - 32 mmol/L _1 Calcium 8.9 - 10.3 mg/dL 8.6(L) 8.9 8.5(L)  Total Protein 6.5 - 8.1 g/dL 6.7 6.4(L) 6.3(L)  Total Bilirubin 0.3 - 1.2 mg/dL 1.2 0.9 1.0  Alkaline Phos 38 - 126 U/L 31(L) 32(L) 31(L)  AST 15 - 41 U/L _2 ALT 0 - 44 U/L _3 Component Value Date/Time   RBC 3.47 (L) 08/17/2020 0948   MCV 99.1 08/17/2020 0948   MCV 93 09/13/2013 1842   MCH 32.6 08/17/2020 0948   MCHC 32.8 08/17/2020 0948   RDW 14.6 08/17/2020 0948   RDW 14.1 09/13/2013 1842   LYMPHSABS 1.1 08/17/2020 0948   LYMPHSABS 0.8 (L) 09/13/2013 1842   MONOABS 0.2 08/17/2020 0948   MONOABS 0.4 09/13/2013 1842   EOSABS 0.1 08/17/2020 0948   EOSABS 0.0 09/13/2013 1842   BASOSABS 0.0 08/17/2020 0948   BASOSABS 0.0 09/13/2013 1842   Lab Results  Component Value Date   LDH 180 08/17/2020   LDH 153 06/16/2020   LDH 163 05/17/2020    DIAGNOSTIC IMAGING:  I have independently reviewed the scans and discussed with the patient. No results found.   ASSESSMENT:  1. 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 BCR/ABL by FISH on  02/23/2020 was positive for fusion protein. 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 -Chromosome analysis confirms Philadelphia  chromosome. -BCR/ABL by quantitative PCR with B2 A2 transcript 132% -Dasatinib 50 mg daily started on 03/13/2020. -Sprycel dose reduced to 20 mg daily on 05/19/2020 secondary to leukopenia/neutropenia.  2.Right breast DCIS: -Screening mammogram on 02/03/2018 was abnormal. -Ultrasound of the right breast on 02/11/2018 shows mass measuring 0.5 x 0.4 x 0.5 cm, circumscribed hypoechoic in the 3:30 o'clock location of the right breast. Right axilla ultrasound was negative for adenopathy. -Right breast core biopsy on 02/18/2018 shows ductal carcinoma with papillary configuration. In the comment section, it notes that this may represent DCIS involving a papillary lesion or it may represent an intracystic papillary ductal carcinoma. ER/PR was 95% positive. -Right breast lumpectomy on 03/12/2018, pathology showing intermediate grade DCIS, 0.6 cm, with areas showing papillary configuration. Posterior margin was 0.2 cm. No evidence of invasion. -Radiation therapy was refused. -Tamoxifen for 5 years started on 04/09/2018. -Mammogram on 02/16/2020 was BI-RADS Category 2.  3. Osteoporosis: -DEXA scan on 04/11/2018 with T score of -2.9. -DEXA scan on 05/26/2020 with T score -2.3.   PLAN:  1. CML in chronic phase: -BCR/ABL on 06/16/2020 shows B2 A2 transcript of 0.35. -She is tolerating dasatinib 20 mg daily reasonably well.  There are few days when she gets severely fatigued.  Otherwise she has mild fatigue.  Denies any GI symptoms. -No recent infections.  Physical exam today did not reveal any palpable adenopathy. -Recommend continuing dasatinib 20 mg daily. -Reviewed her labs today which showed white count 2.2 with ANC of 800.  This is from myelosuppression from dasatinib.  LFTs are normal. -RTC 6 weeks with repeat BCR/ABL by quantitative PCR along with routine labs. -If there is any worsening of fatigue, will consider cutting back on dasatinib to 5 days a week.  2.  Right breast  DCIS: -Breast exam today did not show any palpable nodules or adenopathy. -She will continue tamoxifen for a total of 5 years. -Next mammogram will be in October this year.  3.  Osteoporosis: -Last DEXA scan in January showed improvement to osteopenia. -She will continue calcium and vitamin D supplements. -She was receiving infusions with her rheumatologist which she discontinued.  Orders placed this encounter:  No orders of the defined types were placed in this encounter.    Derek Jack, MD [place] [phone]   I, Milinda Antis, am acting as a scribe for Dr. Sanda Linger.  I, Derek Jack MD, have reviewed the above documentation for accuracy and completeness, and I agree with the above.

## 2020-08-18 NOTE — Patient Instructions (Signed)
[place] at [facility] Discharge Instructions  You were seen today by Dr. Delton Coombes. He went over your recent results. Your next mammogram will be scheduled after February 15, 2021. Dr. Delton Coombes will see you back in 6 weeks for labs and follow up.   Thank you for choosing [place] at [facility] to provide your oncology and hematology care.  To afford each patient quality time with our provider, please arrive at least 15 minutes before your scheduled appointment time.   If you have a lab appointment with the [facility] please come in thru the Main Entrance and check in at the main information desk  You need to re-schedule your appointment should you arrive 10 or more minutes late.  We strive to give you quality time with our providers, and arriving late affects you and other patients whose appointments are after yours.  Also, if you no show three or more times for appointments you may be dismissed from the clinic at the providers discretion.     Again, thank you for choosing [facility].  Our hope is that these requests will decrease the amount of time that you wait before being seen by our physicians.       _____________________________________________________________  Should you have questions after your visit to [facility], please contact our office at [phone] between the hours of 8:00 a.m. and 4:30 p.m.  Voicemails left after 4:00 p.m. will not be returned until the following business day.  For prescription refill requests, have your pharmacy contact our office and allow 72 hours.    Cancer Center Support Programs:   > Cancer Support Group  2nd Tuesday of the month 1pm-2pm, Journey Room

## 2020-08-24 DIAGNOSIS — Z6826 Body mass index (BMI) 26.0-26.9, adult: Secondary | ICD-10-CM | POA: Diagnosis not present

## 2020-08-24 DIAGNOSIS — E7849 Other hyperlipidemia: Secondary | ICD-10-CM | POA: Diagnosis not present

## 2020-08-24 DIAGNOSIS — E782 Mixed hyperlipidemia: Secondary | ICD-10-CM | POA: Diagnosis not present

## 2020-08-24 DIAGNOSIS — Z23 Encounter for immunization: Secondary | ICD-10-CM | POA: Diagnosis not present

## 2020-08-24 DIAGNOSIS — D729 Disorder of white blood cells, unspecified: Secondary | ICD-10-CM | POA: Diagnosis not present

## 2020-08-24 DIAGNOSIS — E663 Overweight: Secondary | ICD-10-CM | POA: Diagnosis not present

## 2020-08-24 DIAGNOSIS — Z712 Person consulting for explanation of examination or test findings: Secondary | ICD-10-CM | POA: Diagnosis not present

## 2020-08-29 DIAGNOSIS — E782 Mixed hyperlipidemia: Secondary | ICD-10-CM | POA: Diagnosis not present

## 2020-08-29 DIAGNOSIS — R7301 Impaired fasting glucose: Secondary | ICD-10-CM | POA: Diagnosis not present

## 2020-08-29 DIAGNOSIS — N390 Urinary tract infection, site not specified: Secondary | ICD-10-CM | POA: Diagnosis not present

## 2020-08-29 DIAGNOSIS — C9210 Chronic myeloid leukemia, BCR/ABL-positive, not having achieved remission: Secondary | ICD-10-CM | POA: Diagnosis not present

## 2020-08-29 DIAGNOSIS — M791 Myalgia, unspecified site: Secondary | ICD-10-CM | POA: Diagnosis not present

## 2020-08-29 DIAGNOSIS — Z853 Personal history of malignant neoplasm of breast: Secondary | ICD-10-CM | POA: Diagnosis not present

## 2020-08-29 DIAGNOSIS — M810 Age-related osteoporosis without current pathological fracture: Secondary | ICD-10-CM | POA: Diagnosis not present

## 2020-08-29 DIAGNOSIS — M2550 Pain in unspecified joint: Secondary | ICD-10-CM | POA: Diagnosis not present

## 2020-08-30 ENCOUNTER — Other Ambulatory Visit (HOSPITAL_COMMUNITY): Payer: Self-pay

## 2020-08-30 DIAGNOSIS — C9210 Chronic myeloid leukemia, BCR/ABL-positive, not having achieved remission: Secondary | ICD-10-CM

## 2020-08-30 MED ORDER — DASATINIB 20 MG PO TABS: ORAL_TABLET | Freq: Every day | ORAL | 3 refills | Status: DC

## 2020-08-31 ENCOUNTER — Other Ambulatory Visit (HOSPITAL_COMMUNITY): Payer: Self-pay

## 2020-08-31 DIAGNOSIS — C9210 Chronic myeloid leukemia, BCR/ABL-positive, not having achieved remission: Secondary | ICD-10-CM

## 2020-09-04 DIAGNOSIS — I10 Essential (primary) hypertension: Secondary | ICD-10-CM | POA: Diagnosis not present

## 2020-09-04 DIAGNOSIS — E7849 Other hyperlipidemia: Secondary | ICD-10-CM | POA: Diagnosis not present

## 2020-09-05 ENCOUNTER — Other Ambulatory Visit (HOSPITAL_COMMUNITY): Payer: Self-pay

## 2020-09-05 DIAGNOSIS — C9210 Chronic myeloid leukemia, BCR/ABL-positive, not having achieved remission: Secondary | ICD-10-CM

## 2020-09-05 MED ORDER — DASATINIB 20 MG PO TABS: ORAL_TABLET | Freq: Every day | ORAL | 3 refills | Status: DC

## 2020-09-05 NOTE — Telephone Encounter (Signed)
Chart reviewed. Sprycel refilled per Dr. Delton Coombes

## 2020-09-22 ENCOUNTER — Other Ambulatory Visit: Payer: Self-pay

## 2020-09-22 ENCOUNTER — Inpatient Hospital Stay (HOSPITAL_COMMUNITY): Payer: PPO | Attending: Hematology

## 2020-09-22 ENCOUNTER — Inpatient Hospital Stay (HOSPITAL_COMMUNITY): Payer: PPO

## 2020-09-22 DIAGNOSIS — Z9049 Acquired absence of other specified parts of digestive tract: Secondary | ICD-10-CM | POA: Diagnosis not present

## 2020-09-22 DIAGNOSIS — Z808 Family history of malignant neoplasm of other organs or systems: Secondary | ICD-10-CM | POA: Insufficient documentation

## 2020-09-22 DIAGNOSIS — Z86010 Personal history of colonic polyps: Secondary | ICD-10-CM | POA: Insufficient documentation

## 2020-09-22 DIAGNOSIS — Z8249 Family history of ischemic heart disease and other diseases of the circulatory system: Secondary | ICD-10-CM | POA: Diagnosis not present

## 2020-09-22 DIAGNOSIS — C9210 Chronic myeloid leukemia, BCR/ABL-positive, not having achieved remission: Secondary | ICD-10-CM | POA: Insufficient documentation

## 2020-09-22 DIAGNOSIS — M810 Age-related osteoporosis without current pathological fracture: Secondary | ICD-10-CM | POA: Insufficient documentation

## 2020-09-22 DIAGNOSIS — Z8719 Personal history of other diseases of the digestive system: Secondary | ICD-10-CM | POA: Insufficient documentation

## 2020-09-22 DIAGNOSIS — Z79899 Other long term (current) drug therapy: Secondary | ICD-10-CM | POA: Insufficient documentation

## 2020-09-22 DIAGNOSIS — R42 Dizziness and giddiness: Secondary | ICD-10-CM | POA: Diagnosis not present

## 2020-09-22 DIAGNOSIS — D0511 Intraductal carcinoma in situ of right breast: Secondary | ICD-10-CM | POA: Insufficient documentation

## 2020-09-22 DIAGNOSIS — Z818 Family history of other mental and behavioral disorders: Secondary | ICD-10-CM | POA: Diagnosis not present

## 2020-09-22 DIAGNOSIS — Z8349 Family history of other endocrine, nutritional and metabolic diseases: Secondary | ICD-10-CM | POA: Diagnosis not present

## 2020-09-22 DIAGNOSIS — R5383 Other fatigue: Secondary | ICD-10-CM | POA: Diagnosis not present

## 2020-09-22 LAB — LACTATE DEHYDROGENASE: LDH: 165 U/L (ref 98–192)

## 2020-09-22 LAB — COMPREHENSIVE METABOLIC PANEL
ALT: 22 U/L (ref 0–44)
AST: 27 U/L (ref 15–41)
Albumin: 4.1 g/dL (ref 3.5–5.0)
Alkaline Phosphatase: 30 U/L — ABNORMAL LOW (ref 38–126)
Anion gap: 6 (ref 5–15)
BUN: 16 mg/dL (ref 8–23)
CO2: 28 mmol/L (ref 22–32)
Calcium: 8.9 mg/dL (ref 8.9–10.3)
Chloride: 105 mmol/L (ref 98–111)
Creatinine, Ser: 0.85 mg/dL (ref 0.44–1.00)
GFR, Estimated: >60 mL/min (ref >60)
Glucose, Bld: 94 mg/dL (ref 70–99)
Potassium: 4.1 mmol/L (ref 3.5–5.1)
Sodium: 139 mmol/L (ref 135–145)
Total Bilirubin: 1.2 mg/dL (ref 0.3–1.2)
Total Protein: 6.6 g/dL (ref 6.5–8.1)

## 2020-09-22 LAB — CBC WITH DIFFERENTIAL/PLATELET
Abs Immature Granulocytes: 0.01 10*3/uL (ref 0.00–0.07)
Basophils Absolute: 0 10*3/uL (ref 0.0–0.1)
Basophils Relative: 1 %
Eosinophils Absolute: 0.1 10*3/uL (ref 0.0–0.5)
Eosinophils Relative: 4 %
HCT: 36 % (ref 36.0–46.0)
Hemoglobin: 12.3 g/dL (ref 12.0–15.0)
Immature Granulocytes: 0 %
Lymphocytes Relative: 53 %
Lymphs Abs: 1.4 10*3/uL (ref 0.7–4.0)
MCH: 34.3 pg — ABNORMAL HIGH (ref 26.0–34.0)
MCHC: 34.2 g/dL (ref 30.0–36.0)
MCV: 100.3 fL — ABNORMAL HIGH (ref 80.0–100.0)
Monocytes Absolute: 0.3 10*3/uL (ref 0.1–1.0)
Monocytes Relative: 10 %
Neutro Abs: 0.8 10*3/uL — ABNORMAL LOW (ref 1.7–7.7)
Neutrophils Relative %: 32 %
Platelets: 215 10*3/uL (ref 150–400)
RBC: 3.59 MIL/uL — ABNORMAL LOW (ref 3.87–5.11)
RDW: 13.6 % (ref 11.5–15.5)
WBC: 2.6 10*3/uL — ABNORMAL LOW (ref 4.0–10.5)
nRBC: 0 % (ref 0.0–0.2)

## 2020-09-22 LAB — VITAMIN D 25 HYDROXY (VIT D DEFICIENCY, FRACTURES): Vit D, 25-Hydroxy: 63.17 ng/mL (ref 30–100)

## 2020-09-28 NOTE — Progress Notes (Signed)
[place], [place] [address]   CLINIC:  Medical Oncology/Hematology  PCP:  Celene Squibb, MD [address] 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    CANCER STAGING: Cancer Staging No matching staging information was found for the patient.  INTERVAL HISTORY:  [name], a 86 y.o. 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 [name] was last seen on 08/18/2020.   Today she reports feeling okay. She is able to do all of her usual activities, but she reports increased levels of fatigue. She is taking tamoxifen and sprycel and tolerating them well. She denies n/v/d/c. She is taking calcium and vit D.   REVIEW OF SYSTEMS:  Review of Systems  Constitutional: Positive for appetite change (50%) and fatigue (25%).  Neurological: Positive for dizziness.  All other systems reviewed and are negative.   PAST MEDICAL/SURGICAL HISTORY:  Past Medical History:  Diagnosis Date  . Bladder infection, chronic   . Cancer ([place] [facility]) 02/2018   right breast cancer  . Complication of anesthesia   . GERD (gastroesophageal reflux disease)   . Hemorrhoids   . Hypercholesterolemia   . Polymyalgia rheumatica ([place])   . PONV (postoperative nausea and vomiting)   . S/P colonoscopy Jun 14, 2004   friable internal and external hemorrhoids, left-sided diverticula  . S/P endoscopy August 13, 2006   pale 1-3 mm nodules consistent with benign squamous papilloma, tiny hiatal hernia  . Serrated adenoma of colon    Past Surgical History:  Procedure Laterality Date  . ABDOMINAL HYSTERECTOMY    . BREAST LUMPECTOMY WITH RADIOACTIVE SEED LOCALIZATION Right 03/12/2018   Procedure: RIGHT BREAST LUMPECTOMY WITH RADIOACTIVE SEED  LOCALIZATION;  Surgeon: Fanny Skates, MD;  Location: [facility];  Service: General;  Laterality: Right;  . CHOLECYSTECTOMY    . COLONOSCOPY  11/20/2010   Dr. Gala Romney- serrated adenomaL side diverticulosis, hemorrhoids  . COLONOSCOPY  06/14/04   friable internal and external hemorrhoids, left-sided diverticula  . COLONOSCOPY N/A 08/23/2016   Dr. Gala Romney: Diverticulosis, medium-sized grade 2 internal hemorrhoids.  . ESOPHAGOGASTRODUODENOSCOPY  08/13/06   pale 1-3 mm nodules consistent with benign squamous papilloma, tiny hiatal hernia  . ESOPHAGOGASTRODUODENOSCOPY N/A 08/23/2016   Dr. Gala Romney: Normal  . FLEXIBLE SIGMOIDOSCOPY  11/15/2011   Procedure: FLEXIBLE SIGMOIDOSCOPY;  Surgeon: Daneil Dolin, MD;  [facility];  Service: Endoscopy;  Laterality: N/A;  12:30PM  . WRIST FRACTURE SURGERY Right     SOCIAL HISTORY:  Social History   Socioeconomic History  . Marital status: Widowed    Spouse name: Not on file  . Number of children: 2  . Years of education: Not on file  . Highest education level: Not on file  Occupational History  . Not on file  Tobacco Use  . Smoking status: Never Smoker  . Smokeless tobacco: Never Used  Vaping Use  . Vaping Use: Never used  Substance and Sexual Activity  . Alcohol use: Yes    Alcohol/week: 2.0 standard drinks    Types: 2 Glasses of wine per week    Comment: Drinks wine 2-3 days per week ( usually a couple of glasses each time)  . Drug use: No  .  Sexual activity: Not on file  Other Topics Concern  . Not on file  Social History Narrative  . Not on file   Social Determinants of Health   Financial Resource Strain: Low Risk   . Difficulty of Paying Living Expenses: Not hard at all  Food Insecurity: No Food Insecurity  . Worried About Charity fundraiser in the Last Year: Never true  . Ran Out of Food in the Last Year: Never true  Transportation Needs: No Transportation Needs  . Lack of Transportation (Medical): No  .  Lack of Transportation (Non-Medical): No  Physical Activity: Insufficiently Active  . Days of Exercise per Week: 7 days  . Minutes of Exercise per Session: 20 min  Stress: No Stress Concern Present  . Feeling of Stress : Not at all  Social Connections: Moderately Isolated  . Frequency of Communication with Friends and Family: Twice a week  . Frequency of Social Gatherings with Friends and Family: Twice a week  . Attends Religious Services: More than 4 times per year  . Active Member of Clubs or Organizations: No  . Attends Archivist Meetings: Never  . Marital Status: Widowed  Intimate Partner Violence: Not At Risk  . Fear of Current or Ex-Partner: No  . Emotionally Abused: No  . Physically Abused: No  . Sexually Abused: No    FAMILY HISTORY:  Family History  Problem Relation Age of Onset  . Thyroid cancer Mother   . Parkinson's disease Sister   . Thyroid disease Brother   . Aneurysm Sister   . Thyroid cancer Other   . Thyroid cancer Other   . Colon cancer Neg Hx     CURRENT MEDICATIONS:  Current Outpatient Medications  Medication Sig Dispense Refill  . Calcium Carb-Cholecalciferol (CALCIUM 600 + D PO) Take 2 tablets by mouth daily.    . dasatinib (SPRYCEL) 20 MG tablet TAKE 1 TABLET (20 MG TOTAL) BY MOUTH DAILY. 30 tablet 3  . magnesium 30 MG tablet Take 500 mg by mouth daily.    . Multiple Vitamin (MULTIVITAMIN WITH MINERALS) TABS tablet Take 1 tablet by mouth daily.    . polyethylene glycol (MIRALAX / GLYCOLAX) packet Take 17 g by mouth daily as needed.     . sucralfate (CARAFATE) 1 GM/10ML suspension Take by mouth.    . tamoxifen (NOLVADEX) 20 MG tablet TAKE 1 TABLET BY MOUTH DAILY. 90 tablet 3  . Wheat Dextrin (BENEFIBER) POWD Take by mouth 2 (two) times daily. Takes 1 tbsp twice a day     No current facility-administered medications for this visit.    ALLERGIES:  Allergies  Allergen Reactions  . Fish Allergy Nausea And Vomiting  . Shrimp [Shellfish  Allergy] Nausea And Vomiting    PHYSICAL EXAM:  Performance status (ECOG): 1 - Symptomatic but completely ambulatory  There were no vitals filed for this visit. Wt Readings from Last 3 Encounters:  08/18/20 154 lb 12.8 oz (70.2 kg)  06/23/20 150 lb 9.6 oz (68.3 kg)  05/19/20 151 lb 7 oz (68.7 kg)   Physical Exam Vitals reviewed.  Constitutional:      Appearance: Normal appearance.  Cardiovascular:     Rate and Rhythm: Normal rate and regular rhythm.     Pulses: Normal pulses.     Heart sounds: Normal heart sounds.  Pulmonary:     Effort: Pulmonary effort is normal.     Breath sounds: Normal breath sounds.  Neurological:     General: No focal  deficit present.     Mental Status: She is alert and oriented to person, place, and time.  Psychiatric:        Mood and Affect: Mood normal.        Behavior: Behavior normal.      LABORATORY DATA:  I have reviewed the labs as listed.  CBC Latest Ref Rng & Units 09/22/2020 08/17/2020 06/16/2020  WBC 4.0 - 10.5 K/uL 2.6(L) 2.2(L) 2.9(L)  Hemoglobin 12.0 - 15.0 g/dL 12.3 11.3(L) 11.2(L)  Hematocrit 36.0 - 46.0 % 36.0 34.4(L) 34.4(L)  Platelets 150 - 400 K/uL 215 174 177   CMP Latest Ref Rng & Units 09/22/2020 08/17/2020 06/16/2020  Glucose 70 - 99 mg/dL 94 102(H) 133(H)  BUN 8 - 23 mg/dL _0 Creatinine 0.44 - 1.00 mg/dL 0.85 0.79 0.63  Sodium 135 - 145 mmol/L 139 137 136  Potassium 3.5 - 5.1 mmol/L 4.1 4.1 3.8  Chloride 98 - 111 mmol/L 105 105 102  CO2 22 - 32 mmol/L _1 Calcium 8.9 - 10.3 mg/dL 8.9 8.6(L) 8.9  Total Protein 6.5 - 8.1 g/dL 6.6 6.7 6.4(L)  Total Bilirubin 0.3 - 1.2 mg/dL 1.2 1.2 0.9  Alkaline Phos 38 - 126 U/L 30(L) 31(L) 32(L)  AST 15 - 41 U/L _2 ALT 0 - 44 U/L _3 DIAGNOSTIC IMAGING:  I have independently reviewed the scans and discussed with the patient. No results found.   ASSESSMENT:  1. 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 BCR/ABL by FISH on 02/23/2020  was positive for fusion protein. 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 -Chromosome analysis confirms Philadelphia chromosome. -BCR/ABL by quantitative PCR with B2 A2 transcript 132% -Dasatinib 50 mg daily started on 03/13/2020. -Sprycel dose reduced to 20 mg daily on 05/19/2020 secondary to leukopenia/neutropenia.  2.Right breast DCIS: -Screening mammogram on 02/03/2018 was abnormal. -Ultrasound of the right breast on 02/11/2018 shows mass measuring 0.5 x 0.4 x 0.5 cm, circumscribed hypoechoic in the 3:30 o'clock location of the right breast. Right axilla ultrasound was negative for adenopathy. -Right breast core biopsy on 02/18/2018 shows ductal carcinoma with papillary configuration. In the comment section, it notes that this may represent DCIS involving a papillary lesion or it may represent an intracystic papillary ductal carcinoma. ER/PR was 95% positive. -Right breast lumpectomy on 03/12/2018, pathology showing intermediate grade DCIS, 0.6 cm, with areas showing papillary configuration. Posterior margin was 0.2 cm. No evidence of invasion. -Radiation therapy was refused. -Tamoxifen for 5 years started on 04/09/2018. -Mammogram on 02/16/2020 was BI-RADS Category 2.  3. Osteoporosis: -DEXA scan on 04/11/2018 with T score of -2.9. -DEXA scan on 05/26/2020 with T score -2.3.   PLAN:  1. CML in chronic phase: -She is taking Dasatinib 20 mg daily. - She is complaining of severe fatigue with energy levels dropping down to 25%.  Occasional dizziness has been stable. - Reviewed labs which showed white count 2.6 and ANC of 800.  Hemoglobin is 12.3 and platelet count is 215. - LFTs were normal.  No GI side effects from Dasatinib noted. - Reviewed BCR/ABL by quantitative PCR from 09/22/2020 which was negative. - Given her fatigue, recommend cutting back on Dasatinib to 20 mg daily, 5 days/week, Monday through Friday. - RTC 6 weeks for follow-up  with labs.  2.Right breast DCIS: -Continue tamoxifen for total of 5 years. - Mammogram in October.  3.  Osteoporosis: -DEXA scan on  05/28/2020 showed improvement in bone density. - Continue calcium and vitamin D supplements. - Vitamin D levels today 63. - She is no longer receiving infusions with her rheumatologist.   Orders placed this encounter:  No orders of the defined types were placed in this encounter.    Derek Jack, MD [facility] [phone]   I, Thana Ates, am acting as a scribe for Dr. Derek Jack.  I, Derek Jack MD, have reviewed the above documentation for accuracy and completeness, and I agree with the above.

## 2020-09-29 ENCOUNTER — Inpatient Hospital Stay (HOSPITAL_COMMUNITY): Payer: PPO | Admitting: Hematology

## 2020-09-29 ENCOUNTER — Other Ambulatory Visit: Payer: Self-pay

## 2020-09-29 VITALS — BP 150/61 | HR 67 | Temp 96.7°F | Resp 18 | Wt 152.6 lb

## 2020-09-29 DIAGNOSIS — C9210 Chronic myeloid leukemia, BCR/ABL-positive, not having achieved remission: Secondary | ICD-10-CM

## 2020-09-29 DIAGNOSIS — D0511 Intraductal carcinoma in situ of right breast: Secondary | ICD-10-CM

## 2020-09-29 NOTE — Patient Instructions (Signed)
[facility] Discharge Instructions  You were seen today by Dr. Katragadda. He went over your recent results. Dr. Katragadda will see you back in 6 weeks for labs and follow up.   Thank you for choosing  Can[facility] Center at [facility] to provide your oncology and hematology care.  To afford each patient quality time with our provider, please arrive at least 15 minutes before your scheduled appointment time.   If you have a lab appointment with the Cancer Center please come in thru the Main Entrance and check in at the main information desk  You need to re-schedule your appointment should you arrive 10 or more minutes late.  We strive to give you quality time with our providers, and arriving late affects you and other patients whose appointments are after yours.  Also, if you no show three or more times for appointments you may be dismissed from the clinic at the providers discretion.     Again, thank you for choosing [facility].  Our hope is that these requests will decrease the amount of time that you wait before being seen by our physicians.       _____________________________________________________________  Should you have questions after your visit to [facility], please contact our office at [phone] between the hours of 8:00 a.m. and 4:30 p.m.  Voicemails left after 4:00 p.m. will not be returned until the following business day.  For prescription refill requests, have your pharmacy contact our office and allow 72 hours.    Cancer Center Support Programs:   > Cancer Support Group  2nd Tuesday of the month 1pm-2pm, Journey Room    

## 2020-10-01 LAB — BCR-ABL1, CML/ALL, PCR, QUANT: Interpretation (BCRAL):: NEGATIVE

## 2020-11-03 DIAGNOSIS — I10 Essential (primary) hypertension: Secondary | ICD-10-CM | POA: Diagnosis not present

## 2020-11-03 DIAGNOSIS — E7849 Other hyperlipidemia: Secondary | ICD-10-CM | POA: Diagnosis not present

## 2020-11-09 ENCOUNTER — Other Ambulatory Visit: Payer: Self-pay

## 2020-11-09 ENCOUNTER — Inpatient Hospital Stay (HOSPITAL_COMMUNITY): Payer: PPO | Attending: Hematology

## 2020-11-09 DIAGNOSIS — Z170 Estrogen receptor positive status [ER+]: Secondary | ICD-10-CM | POA: Diagnosis not present

## 2020-11-09 DIAGNOSIS — R232 Flushing: Secondary | ICD-10-CM | POA: Insufficient documentation

## 2020-11-09 DIAGNOSIS — K219 Gastro-esophageal reflux disease without esophagitis: Secondary | ICD-10-CM | POA: Diagnosis not present

## 2020-11-09 DIAGNOSIS — Z79810 Long term (current) use of selective estrogen receptor modulators (SERMs): Secondary | ICD-10-CM | POA: Diagnosis not present

## 2020-11-09 DIAGNOSIS — D61818 Other pancytopenia: Secondary | ICD-10-CM | POA: Diagnosis not present

## 2020-11-09 DIAGNOSIS — L03031 Cellulitis of right toe: Secondary | ICD-10-CM | POA: Diagnosis not present

## 2020-11-09 DIAGNOSIS — D0511 Intraductal carcinoma in situ of right breast: Secondary | ICD-10-CM | POA: Insufficient documentation

## 2020-11-09 DIAGNOSIS — E7800 Pure hypercholesterolemia, unspecified: Secondary | ICD-10-CM | POA: Insufficient documentation

## 2020-11-09 DIAGNOSIS — C9210 Chronic myeloid leukemia, BCR/ABL-positive, not having achieved remission: Secondary | ICD-10-CM | POA: Diagnosis not present

## 2020-11-09 DIAGNOSIS — M353 Polymyalgia rheumatica: Secondary | ICD-10-CM | POA: Insufficient documentation

## 2020-11-09 DIAGNOSIS — Z86010 Personal history of colonic polyps: Secondary | ICD-10-CM | POA: Diagnosis not present

## 2020-11-09 LAB — CBC WITH DIFFERENTIAL/PLATELET
Abs Immature Granulocytes: 0.01 10*3/uL (ref 0.00–0.07)
Basophils Absolute: 0 10*3/uL (ref 0.0–0.1)
Basophils Relative: 1 %
Eosinophils Absolute: 0.1 10*3/uL (ref 0.0–0.5)
Eosinophils Relative: 3 %
HCT: 34 % — ABNORMAL LOW (ref 36.0–46.0)
Hemoglobin: 11.3 g/dL — ABNORMAL LOW (ref 12.0–15.0)
Immature Granulocytes: 0 %
Lymphocytes Relative: 44 %
Lymphs Abs: 1.5 10*3/uL (ref 0.7–4.0)
MCH: 32.9 pg (ref 26.0–34.0)
MCHC: 33.2 g/dL (ref 30.0–36.0)
MCV: 99.1 fL (ref 80.0–100.0)
Monocytes Absolute: 0.3 10*3/uL (ref 0.1–1.0)
Monocytes Relative: 8 %
Neutro Abs: 1.6 10*3/uL — ABNORMAL LOW (ref 1.7–7.7)
Neutrophils Relative %: 44 %
Platelets: 214 10*3/uL (ref 150–400)
RBC: 3.43 MIL/uL — ABNORMAL LOW (ref 3.87–5.11)
RDW: 13.3 % (ref 11.5–15.5)
WBC: 3.5 10*3/uL — ABNORMAL LOW (ref 4.0–10.5)
nRBC: 0 % (ref 0.0–0.2)

## 2020-11-09 LAB — COMPREHENSIVE METABOLIC PANEL
ALT: 16 U/L (ref 0–44)
AST: 21 U/L (ref 15–41)
Albumin: 3.9 g/dL (ref 3.5–5.0)
Alkaline Phosphatase: 40 U/L (ref 38–126)
Anion gap: 6 (ref 5–15)
BUN: 14 mg/dL (ref 8–23)
CO2: 26 mmol/L (ref 22–32)
Calcium: 8.4 mg/dL — ABNORMAL LOW (ref 8.9–10.3)
Chloride: 105 mmol/L (ref 98–111)
Creatinine, Ser: 0.77 mg/dL (ref 0.44–1.00)
GFR, Estimated: >60 mL/min (ref >60)
Glucose, Bld: 103 mg/dL — ABNORMAL HIGH (ref 70–99)
Potassium: 3.9 mmol/L (ref 3.5–5.1)
Sodium: 137 mmol/L (ref 135–145)
Total Bilirubin: 0.8 mg/dL (ref 0.3–1.2)
Total Protein: 6.4 g/dL — ABNORMAL LOW (ref 6.5–8.1)

## 2020-11-09 LAB — LACTATE DEHYDROGENASE: LDH: 148 U/L (ref 98–192)

## 2020-11-16 ENCOUNTER — Inpatient Hospital Stay (HOSPITAL_COMMUNITY): Payer: PPO | Admitting: Hematology and Oncology

## 2020-11-16 ENCOUNTER — Encounter (HOSPITAL_COMMUNITY): Payer: Self-pay | Admitting: Hematology and Oncology

## 2020-11-16 ENCOUNTER — Other Ambulatory Visit: Payer: Self-pay

## 2020-11-16 DIAGNOSIS — D61818 Other pancytopenia: Secondary | ICD-10-CM

## 2020-11-16 DIAGNOSIS — D0511 Intraductal carcinoma in situ of right breast: Secondary | ICD-10-CM | POA: Diagnosis not present

## 2020-11-16 DIAGNOSIS — C9210 Chronic myeloid leukemia, BCR/ABL-positive, not having achieved remission: Secondary | ICD-10-CM | POA: Diagnosis not present

## 2020-11-16 NOTE — Assessment & Plan Note (Signed)
I have reviewed her recent blood work BCR/ABL is not detectable She has achieved major molecular response Her mild pancytopenia has improved She will continue current dose of Sprycel

## 2020-11-16 NOTE — Assessment & Plan Note (Signed)
This is due to side effects of treatment She is not symptomatic Observe closely 

## 2020-11-16 NOTE — Assessment & Plan Note (Signed)
She denies major side effects such as hot flashes She will continue tamoxifen

## 2020-11-16 NOTE — Progress Notes (Signed)
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 female was seen because of diagnosis of CML as well as breast cancer She is doing well In her previous visit, she complained of excessive fatigue Now, her energy level has improved She denies significant hot flashes The patient denies any recent signs or symptoms of bleeding such as spontaneous epistaxis, hematuria or hematochezia. No recent infection, fever or chills She denies any recent abnormal breast examination, palpable mass, abnormal breast appearance or nipple changes  MEDICAL HISTORY:  Past Medical History:  Diagnosis Date   Bladder infection, chronic    Cancer ([place]) 02/2018   right breast cancer   Complication of anesthesia    GERD (gastroesophageal reflux disease)    Hemorrhoids    Hypercholesterolemia    Polymyalgia rheumatica ([facility])    PONV (postoperative nausea and vomiting)    S/P colonoscopy Jun 14, 2004   friable internal and external hemorrhoids, left-sided diverticula   S/P endoscopy August 13, 2006   pale 1-3 mm nodules consistent with benign squamous papilloma, tiny hiatal hernia   Serrated adenoma of colon     SURGICAL HISTORY: Past Surgical History:  Procedure Laterality Date   ABDOMINAL HYSTERECTOMY     BREAST LUMPECTOMY WITH RADIOACTIVE SEED LOCALIZATION Right 03/12/2018   Procedure: RIGHT BREAST LUMPECTOMY WITH RADIOACTIVE SEED LOCALIZATION;  Surgeon: Fanny Skates, MD;  Location: [facility];  Service: General;  Laterality: Right;   CHOLECYSTECTOMY     COLONOSCOPY  11/20/2010   Dr. Gala Romney- serrated adenomaL side diverticulosis, hemorrhoids   COLONOSCOPY  06/14/04   friable internal and external  hemorrhoids, left-sided diverticula   COLONOSCOPY N/A 08/23/2016   Dr. Gala Romney: Diverticulosis, medium-sized grade 2 internal hemorrhoids.   ESOPHAGOGASTRODUODENOSCOPY  08/13/06   pale 1-3 mm nodules consistent with benign squamous papilloma, tiny hiatal hernia   ESOPHAGOGASTRODUODENOSCOPY N/A 08/23/2016   Dr. Gala Romney: Normal   FLEXIBLE SIGMOIDOSCOPY  11/15/2011   Procedure: FLEXIBLE SIGMOIDOSCOPY;  Surgeon: Daneil Dolin, MD;  [facility];  Service: Endoscopy;  Laterality: N/A;  12:30PM   WRIST FRACTURE SURGERY Right     SOCIAL HISTORY: Social History   Socioeconomic History   Marital status: Widowed    Spouse name: Not on file   Number of children: 2   Years of education: Not on file   Highest education level: Not on file  Occupational History   Not on file  Tobacco Use   Smoking status: Never   Smokeless tobacco: Never  Vaping Use   Vaping Use: Never used  Substance and Sexual Activity   Alcohol use: Yes    Alcohol/week: 2.0 standard drinks    Types: 2 Glasses of wine per week    Comment: Drinks wine 2-3 days per week ( usually a couple of glasses each time)   Drug use: No   Sexual activity: Not on file  Other Topics Concern   Not on file  Social History Narrative   Not on file   Social Determinants of Health   Financial Resource Strain: Low Risk    Difficulty of Paying Living Expenses: Not hard at all  Food Insecurity: No Food Insecurity   Worried About [place] in the Last Year: Never true  Ran Out of Food in the Last Year: Never true  Transportation Needs: No Transportation Needs   Lack of Transportation (Medical): No   Lack of Transportation (Non-Medical): No  Physical Activity: Insufficiently Active   Days of Exercise per Week: 7 days   Minutes of Exercise per Session: 20 min  Stress: No Stress Concern Present   Feeling of Stress : Not at all  Social Connections: Moderately Isolated   Frequency of Communication with Friends and Family:  Twice a week   Frequency of Social Gatherings with Friends and Family: Twice a week   Attends Religious Services: More than 4 times per year   Active Member of [employer] or Organizations: No   Attends Archivist Meetings: Never   Marital Status: Widowed  Human resources officer Violence: Not At Risk   Fear of Current or Ex-Partner: No   Emotionally Abused: No   Physically Abused: No   Sexually Abused: No    FAMILY HISTORY: Family History  Problem Relation Age of Onset   Thyroid cancer Mother    Parkinson's disease Sister    Thyroid disease Brother    Aneurysm Sister    Thyroid cancer Other    Thyroid cancer Other    Colon cancer Neg Hx     ALLERGIES:  is allergic to fish allergy and shrimp [shellfish allergy].  MEDICATIONS:  Current Outpatient Medications  Medication Sig Dispense Refill   Calcium Carb-Cholecalciferol (CALCIUM 600 + D PO) Take 2 tablets by mouth daily.     cephALEXin (KEFLEX) 500 MG capsule Take 500 mg by mouth 3 (three) times daily.     dasatinib (SPRYCEL) 20 MG tablet TAKE 1 TABLET (20 MG TOTAL) BY MOUTH DAILY. 30 tablet 3   doxycycline (VIBRA-TABS) 100 MG tablet Take 100 mg by mouth 2 (two) times daily.     magnesium 30 MG tablet Take 500 mg by mouth daily.     Multiple Vitamin (MULTIVITAMIN WITH MINERALS) TABS tablet Take 1 tablet by mouth daily.     polyethylene glycol (MIRALAX / GLYCOLAX) packet Take 17 g by mouth daily as needed.     sucralfate (CARAFATE) 1 GM/10ML suspension Take by mouth.     tamoxifen (NOLVADEX) 20 MG tablet TAKE 1 TABLET BY MOUTH DAILY. 90 tablet 3   Wheat Dextrin (BENEFIBER) POWD Take by mouth 2 (two) times daily. Takes 1 tbsp twice a day     No current facility-administered medications for this visit.    REVIEW OF SYSTEMS:   Constitutional: Denies fevers, chills or abnormal night sweats Eyes: Denies blurriness of vision, double vision or watery eyes Ears, nose, mouth, throat, and face: Denies mucositis or sore  throat Respiratory: Denies cough, dyspnea or wheezes Cardiovascular: Denies palpitation, chest discomfort or lower extremity swelling Gastrointestinal:  Denies nausea, heartburn or change in bowel habits Skin: Denies abnormal skin rashes Lymphatics: Denies new lymphadenopathy or easy bruising Neurological:Denies numbness, tingling or new weaknesses Behavioral/Psych: Mood is stable, no new changes  All other systems were reviewed with the patient and are negative.  PHYSICAL EXAMINATION: ECOG PERFORMANCE STATUS: 1 - Symptomatic but completely ambulatory  Vitals:   11/16/20 1105  BP: (!) 141/51  Pulse: 66  Resp: 18  Temp: (!) 96.8 F (36 C)  SpO2: 98%   Filed Weights   11/16/20 1105  Weight: 150 lb 11.2 oz (68.4 kg)    GENERAL:alert, no distress and comfortable SKIN: skin color, texture, turgor are normal, no rashes or significant lesions EYES: normal, conjunctiva are pink  and non-injected, sclera clear OROPHARYNX:no exudate, normal lips, buccal mucosa, and tongue  NECK: supple, thyroid normal size, non-tender, without nodularity LYMPH:  no palpable lymphadenopathy in the cervical, axillary or inguinal LUNGS: clear to auscultation and percussion with normal breathing effort HEART: regular rate & rhythm and no murmurs without lower extremity edema ABDOMEN:abdomen soft, non-tender and normal bowel sounds Musculoskeletal:no cyanosis of digits and no clubbing  PSYCH: alert & oriented x 3 with fluent speech NEURO: no focal motor/sensory deficits  LABORATORY DATA:  I have reviewed the data as listed Lab Results  Component Value Date   WBC 3.5 (L) 11/09/2020   HGB 11.3 (L) 11/09/2020   HCT 34.0 (L) 11/09/2020   MCV 99.1 11/09/2020   PLT 214 11/09/2020   Recent Labs    08/17/20 0948 09/22/20 1008 11/09/20 1334  NA 137 139 137  K 4.1 4.1 3.9  CL 105 105 105  CO2 24 28 26   GLUCOSE 102* 94 103*  BUN 19 16 14   CREATININE 0.79 0.85 0.77  CALCIUM 8.6* 8.9 8.4*  GFRNONAA  >60 >60 >60  PROT 6.7 6.6 6.4*  ALBUMIN 4.2 4.1 3.9  AST 30 27 21   ALT 24 22 16   ALKPHOS 31* 30* 40  BILITOT 1.2 1.2 0.8    ASSESSMENT & PLAN:  CML (chronic myelocytic leukemia) ([place]) I have reviewed her recent blood work BCR/ABL is not detectable She has achieved major molecular response Her mild pancytopenia has improved She will continue current dose of Sprycel  Pancytopenia, acquired ([place]) This is due to side effects of treatment She is not symptomatic Observe closely  Breast neoplasm, Tis (DCIS), right She denies major side effects such as hot flashes She will continue tamoxifen  No orders of the defined types were placed in this encounter.   All questions were answered. The patient knows to call the clinic with any problems, questions or concerns. The total time spent in the appointment was 20 minutes encounter with patients including review of chart and various tests results, discussions about plan of care and coordination of care plan   Heath Lark, MD 11/16/2020 6:00 PM

## 2020-11-18 ENCOUNTER — Other Ambulatory Visit (HOSPITAL_COMMUNITY): Payer: Self-pay

## 2020-11-22 ENCOUNTER — Other Ambulatory Visit (HOSPITAL_COMMUNITY): Payer: Self-pay | Admitting: Internal Medicine

## 2020-11-22 DIAGNOSIS — Z1231 Encounter for screening mammogram for malignant neoplasm of breast: Secondary | ICD-10-CM

## 2020-11-22 DIAGNOSIS — Z13820 Encounter for screening for osteoporosis: Secondary | ICD-10-CM

## 2020-12-04 DIAGNOSIS — I10 Essential (primary) hypertension: Secondary | ICD-10-CM | POA: Diagnosis not present

## 2020-12-04 DIAGNOSIS — E7849 Other hyperlipidemia: Secondary | ICD-10-CM | POA: Diagnosis not present

## 2020-12-14 ENCOUNTER — Other Ambulatory Visit (HOSPITAL_COMMUNITY): Payer: Self-pay

## 2020-12-14 DIAGNOSIS — C9210 Chronic myeloid leukemia, BCR/ABL-positive, not having achieved remission: Secondary | ICD-10-CM

## 2020-12-14 MED ORDER — DASATINIB 20 MG PO TABS: ORAL_TABLET | Freq: Every day | ORAL | 3 refills | Status: DC

## 2020-12-14 NOTE — Telephone Encounter (Signed)
Chart reviewed, Sprycel refilled per last office note with Dr. Alvy Bimler

## 2020-12-19 DIAGNOSIS — N390 Urinary tract infection, site not specified: Secondary | ICD-10-CM | POA: Diagnosis not present

## 2021-01-03 ENCOUNTER — Other Ambulatory Visit (HOSPITAL_COMMUNITY): Payer: Self-pay | Admitting: Family Medicine

## 2021-01-03 DIAGNOSIS — Z9889 Other specified postprocedural states: Secondary | ICD-10-CM

## 2021-01-04 DIAGNOSIS — E7849 Other hyperlipidemia: Secondary | ICD-10-CM | POA: Diagnosis not present

## 2021-01-04 DIAGNOSIS — I10 Essential (primary) hypertension: Secondary | ICD-10-CM | POA: Diagnosis not present

## 2021-02-01 ENCOUNTER — Other Ambulatory Visit (HOSPITAL_COMMUNITY): Payer: Self-pay | Admitting: Hematology

## 2021-02-01 DIAGNOSIS — D0511 Intraductal carcinoma in situ of right breast: Secondary | ICD-10-CM

## 2021-02-16 ENCOUNTER — Ambulatory Visit (HOSPITAL_COMMUNITY): Payer: PPO

## 2021-02-19 ENCOUNTER — Encounter: Payer: Self-pay | Admitting: Emergency Medicine

## 2021-02-19 ENCOUNTER — Ambulatory Visit: Payer: PPO

## 2021-02-19 ENCOUNTER — Telehealth: Payer: Self-pay | Admitting: Emergency Medicine

## 2021-02-19 ENCOUNTER — Other Ambulatory Visit: Payer: Self-pay

## 2021-02-19 ENCOUNTER — Ambulatory Visit
Admission: EM | Admit: 2021-02-19 | Discharge: 2021-02-19 | Disposition: A | Discharge status: Home / Self Care | Payer: PPO | Attending: Emergency Medicine | Admitting: Emergency Medicine

## 2021-02-19 DIAGNOSIS — R319 Hematuria, unspecified: Secondary | ICD-10-CM

## 2021-02-19 DIAGNOSIS — N390 Urinary tract infection, site not specified: Secondary | ICD-10-CM | POA: Diagnosis not present

## 2021-02-19 LAB — POCT URINALYSIS DIP (MANUAL ENTRY)
Bilirubin, UA: NEGATIVE
Glucose, UA: NEGATIVE mg/dL
Ketones, POC UA: NEGATIVE mg/dL
Nitrite, UA: NEGATIVE
Spec Grav, UA: 1.015 (ref 1.010–1.025)
Urobilinogen, UA: 0.2 E.U./dL
pH, UA: 7.5 (ref 5.0–8.0)

## 2021-02-19 MED ORDER — CEPHALEXIN 500 MG PO CAPS: 500.0000 mg | ORAL_CAPSULE | Freq: Two times a day (BID) | ORAL | 0 refills | Status: DC

## 2021-02-19 MED ORDER — PHENAZOPYRIDINE HCL 200 MG PO TABS: 200.0000 mg | ORAL_TABLET | Freq: Three times a day (TID) | ORAL | 0 refills | Status: DC | PRN

## 2021-02-19 NOTE — ED Provider Notes (Signed)
HPI  SUBJECTIVE:  [name] is a 86 y.o. female who presents with 1 week of dysuria, urgency, frequency, and intermittent left lower quadrant pain.  No cloudy or odorous urine, hematuria, nausea, vomiting, fevers, back, pelvic pain, no vaginal complaints.  She states that she is not sexually active.  No recent antibiotics.  She has tried Azo with improvement in her symptoms.  No aggravating factors.  She has a past medical history of leukemia, currently on chemotherapy and UTI.  No history of pyelonephritis, nephrolithiasis, hypertension, diabetes, chronic kidney disease.  WUJ:WJXB, Edwinna Areola, MD   Past Medical History:  Diagnosis Date   Bladder infection, chronic    Cancer ([place]) 02/2018   right breast cancer   Complication of anesthesia    GERD (gastroesophageal reflux disease)    Hemorrhoids    Hypercholesterolemia    Polymyalgia rheumatica (HCC)    PONV (postoperative nausea and vomiting)    S/P colonoscopy Jun 14, 2004   friable internal and external hemorrhoids, left-sided diverticula   S/P endoscopy August 13, 2006   pale 1-3 mm nodules consistent with benign squamous papilloma, tiny hiatal hernia   Serrated adenoma of colon     Past Surgical History:  Procedure Laterality Date   ABDOMINAL HYSTERECTOMY     BREAST LUMPECTOMY WITH RADIOACTIVE SEED LOCALIZATION Right 03/12/2018   Procedure: RIGHT BREAST LUMPECTOMY WITH RADIOACTIVE SEED LOCALIZATION;  Surgeon: Fanny Skates, MD;  Location: [place];  Service: General;  Laterality: Right;   CHOLECYSTECTOMY     COLONOSCOPY  11/20/2010   Dr. Gala Romney- serrated adenomaL side diverticulosis, hemorrhoids   COLONOSCOPY  06/14/04   friable internal and external hemorrhoids, left-sided diverticula   COLONOSCOPY N/A 08/23/2016   Dr. Gala Romney: Diverticulosis, medium-sized grade 2 internal hemorrhoids.   ESOPHAGOGASTRODUODENOSCOPY  08/13/06   pale 1-3 mm nodules consistent with benign squamous papilloma, tiny hiatal hernia    ESOPHAGOGASTRODUODENOSCOPY N/A 08/23/2016   Dr. Gala Romney: Normal   FLEXIBLE SIGMOIDOSCOPY  11/15/2011   Procedure: FLEXIBLE SIGMOIDOSCOPY;  Surgeon: Daneil Dolin, MD;  [facility];  Service: Endoscopy;  Laterality: N/A;  12:30PM   WRIST FRACTURE SURGERY Right     Family History  Problem Relation Age of Onset   Thyroid cancer Mother    Parkinson's disease Sister    Thyroid disease Brother    Aneurysm Sister    Thyroid cancer Other    Thyroid cancer Other    Colon cancer Neg Hx     Social History   Tobacco Use   Smoking status: Never   Smokeless tobacco: Never  Vaping Use   Vaping Use: Never used  Substance Use Topics   Alcohol use: Yes    Alcohol/week: 2.0 standard drinks    Types: 2 Glasses of wine per week    Comment: Drinks wine 2-3 days per week ( usually a couple of glasses each time)   Drug use: No    No current facility-administered medications for this encounter.  Current Outpatient Medications:    Calcium Carb-Cholecalciferol (CALCIUM 600 + D PO), Take 2 tablets by mouth daily., Disp: , Rfl:    cephALEXin (KEFLEX) 500 MG capsule, Take 1 capsule (500 mg total) by mouth 2 (two) times daily., Disp: 14 capsule, Rfl: 0   dasatinib (SPRYCEL) 20 MG tablet, TAKE 1 TABLET (20 MG TOTAL) BY MOUTH DAILY., Disp: 30 tablet, Rfl: 3   magnesium 30 MG tablet, Take 500 mg by mouth daily., Disp: , Rfl:    Multiple Vitamin (  MULTIVITAMIN WITH MINERALS) TABS tablet, Take 1 tablet by mouth daily., Disp: , Rfl:    phenazopyridine (PYRIDIUM) 200 MG tablet, Take 1 tablet (200 mg total) by mouth 3 (three) times daily as needed for pain., Disp: 6 tablet, Rfl: 0   polyethylene glycol (MIRALAX / GLYCOLAX) packet, Take 17 g by mouth daily as needed., Disp: , Rfl:    sucralfate (CARAFATE) 1 GM/10ML suspension, Take by mouth., Disp: , Rfl:    tamoxifen (NOLVADEX) 20 MG tablet, TAKE 1 TABLET BY MOUTH DAILY., Disp: 90 tablet, Rfl: 0   Wheat Dextrin (BENEFIBER) POWD, Take by mouth 2 (two)  times daily. Takes 1 tbsp twice a day, Disp: , Rfl:   Allergies  Allergen Reactions   Fish Allergy Nausea And Vomiting   Shrimp [Shellfish Allergy] Nausea And Vomiting     ROS  As noted in HPI.   Physical Exam  BP (!) 163/75 (BP Location: Right Arm)   Pulse 67   Temp 98.1 F (36.7 C) (Oral)   Resp 18   SpO2 97%    Constitutional: Well developed, well nourished, no acute distress Eyes:  EOMI, conjunctiva normal bilaterally HENT: Normocephalic, atraumatic,mucus membranes moist Respiratory: Normal inspiratory effort Cardiovascular: Normal rate GI: nondistended no suprapubic, flank tenderness.  No left lower quadrant tenderness. Back: No CVAT skin: No rash, skin intact Musculoskeletal: no deformities Neurologic: Alert & oriented x 3, no focal neuro deficits Psychiatric: Speech and behavior appropriate   ED Course   Medications - No data to display  Orders Placed This Encounter  Procedures   Urine Culture    Standing Status:   Standing    Number of Occurrences:   1    Order Specific Question:   Indication    Answer:   Dysuria   POCT urinalysis dipstick    Standing Status:   Standing    Number of Occurrences:   1    Results for orders placed or performed during the hospital encounter of 02/19/21 (from the past 24 hour(s))  POCT urinalysis dipstick     Status: Abnormal   Collection Time: 02/19/21 11:40 AM  Result Value Ref Range   Color, UA yellow yellow   Clarity, UA cloudy (A) clear   Glucose, UA negative negative mg/dL   Bilirubin, UA negative negative   Ketones, POC UA negative negative mg/dL   Spec Grav, UA 1.015 1.010 - 1.025   Blood, UA trace-lysed (A) negative   pH, UA 7.5 5.0 - 8.0   Protein Ur, POC trace (A) negative mg/dL   Urobilinogen, UA 0.2 0.2 or 1.0 E.U./dL   Nitrite, UA Negative Negative   Leukocytes, UA Large (3+) (A) Negative   No results found.  ED Clinical Impression  1. Urinary tract infection with hematuria, site unspecified       ED Assessment/Plan  Previous labs reviewed.  Last urine culture 2018 grew an E. coli UTI that was sensitive to everything except ampicillin.  Presentation consistent with UTI.  Urine culture sent.  Home with Keflex, Pyridium  Discussed labs, MDM, treatment plan, and plan for follow-up with patient. Discussed sn/sx that should prompt return to the ED. patient agrees with plan.   Meds ordered this encounter  Medications   DISCONTD: phenazopyridine (PYRIDIUM) 200 MG tablet    Sig: Take 1 tablet (200 mg total) by mouth 3 (three) times daily as needed for pain.    Dispense:  6 tablet    Refill:  0   DISCONTD: cephALEXin (KEFLEX) 500 MG  capsule    Sig: Take 1 capsule (500 mg total) by mouth 2 (two) times daily.    Dispense:  14 capsule    Refill:  0   cephALEXin (KEFLEX) 500 MG capsule    Sig: Take 1 capsule (500 mg total) by mouth 2 (two) times daily.    Dispense:  14 capsule    Refill:  0   phenazopyridine (PYRIDIUM) 200 MG tablet    Sig: Take 1 tablet (200 mg total) by mouth 3 (three) times daily as needed for pain.    Dispense:  6 tablet    Refill:  0      *This clinic note was created using [employer]. Therefore, there may be occasional mistakes despite careful proofreading.  ?    Melynda Ripple, MD 02/20/21 [phone]

## 2021-02-19 NOTE — ED Triage Notes (Signed)
Urinary urgency and burning on urination since yesterday.  Left lower abd pain.

## 2021-02-19 NOTE — Discharge Instructions (Addendum)
Plenty of fluids.  We will contact you only if we need to change your antibiotics.  Finish the antibiotics, even if you feel better.

## 2021-02-20 LAB — URINE CULTURE: Culture: 80000 — AB

## 2021-02-21 ENCOUNTER — Ambulatory Visit (HOSPITAL_COMMUNITY)
Admission: RE | Admit: 2021-02-21 | Discharge: 2021-02-21 | Disposition: A | Discharge status: Home / Self Care | Payer: PPO | Source: Ambulatory Visit | Attending: Family Medicine | Admitting: Family Medicine

## 2021-02-21 ENCOUNTER — Encounter (HOSPITAL_COMMUNITY): Payer: Self-pay

## 2021-02-21 ENCOUNTER — Other Ambulatory Visit: Payer: Self-pay

## 2021-02-21 ENCOUNTER — Inpatient Hospital Stay (HOSPITAL_COMMUNITY): Payer: PPO | Attending: Hematology

## 2021-02-21 DIAGNOSIS — Z9889 Other specified postprocedural states: Secondary | ICD-10-CM

## 2021-02-21 DIAGNOSIS — Z853 Personal history of malignant neoplasm of breast: Secondary | ICD-10-CM | POA: Diagnosis not present

## 2021-02-21 DIAGNOSIS — C9210 Chronic myeloid leukemia, BCR/ABL-positive, not having achieved remission: Secondary | ICD-10-CM

## 2021-02-21 DIAGNOSIS — R922 Inconclusive mammogram: Secondary | ICD-10-CM | POA: Diagnosis not present

## 2021-02-21 LAB — CBC WITH DIFFERENTIAL/PLATELET
Abs Immature Granulocytes: 0.01 10*3/uL (ref 0.00–0.07)
Basophils Absolute: 0 10*3/uL (ref 0.0–0.1)
Basophils Relative: 1 %
Eosinophils Absolute: 0.2 10*3/uL (ref 0.0–0.5)
Eosinophils Relative: 4 %
HCT: 37.4 % (ref 36.0–46.0)
Hemoglobin: 12 g/dL (ref 12.0–15.0)
Immature Granulocytes: 0 %
Lymphocytes Relative: 36 %
Lymphs Abs: 1.5 10*3/uL (ref 0.7–4.0)
MCH: 31.7 pg (ref 26.0–34.0)
MCHC: 32.1 g/dL (ref 30.0–36.0)
MCV: 98.7 fL (ref 80.0–100.0)
Monocytes Absolute: 0.3 10*3/uL (ref 0.1–1.0)
Monocytes Relative: 8 %
Neutro Abs: 2.1 10*3/uL (ref 1.7–7.7)
Neutrophils Relative %: 51 %
Platelets: 175 10*3/uL (ref 150–400)
RBC: 3.79 MIL/uL — ABNORMAL LOW (ref 3.87–5.11)
RDW: 13.1 % (ref 11.5–15.5)
WBC: 4.2 10*3/uL (ref 4.0–10.5)
nRBC: 0 % (ref 0.0–0.2)

## 2021-02-21 LAB — COMPREHENSIVE METABOLIC PANEL
ALT: 20 U/L (ref 0–44)
AST: 25 U/L (ref 15–41)
Albumin: 4 g/dL (ref 3.5–5.0)
Alkaline Phosphatase: 37 U/L — ABNORMAL LOW (ref 38–126)
Anion gap: 6 (ref 5–15)
BUN: 17 mg/dL (ref 8–23)
CO2: 26 mmol/L (ref 22–32)
Calcium: 8.4 mg/dL — ABNORMAL LOW (ref 8.9–10.3)
Chloride: 105 mmol/L (ref 98–111)
Creatinine, Ser: 0.83 mg/dL (ref 0.44–1.00)
GFR, Estimated: >60 mL/min (ref >60)
Glucose, Bld: 102 mg/dL — ABNORMAL HIGH (ref 70–99)
Potassium: 3.8 mmol/L (ref 3.5–5.1)
Sodium: 137 mmol/L (ref 135–145)
Total Bilirubin: 1 mg/dL (ref 0.3–1.2)
Total Protein: 6.7 g/dL (ref 6.5–8.1)

## 2021-02-21 LAB — MAGNESIUM: Magnesium: 2.1 mg/dL (ref 1.7–2.4)

## 2021-02-21 LAB — LACTATE DEHYDROGENASE: LDH: 166 U/L (ref 98–192)

## 2021-02-21 LAB — PHOSPHORUS: Phosphorus: 3.2 mg/dL (ref 2.5–4.6)

## 2021-02-21 LAB — URIC ACID: Uric Acid, Serum: 4.9 mg/dL (ref 2.5–7.1)

## 2021-02-27 NOTE — Progress Notes (Signed)
 [facility] [address] 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    CANCER STAGING: Cancer Staging No matching staging information was found for the patient.  INTERVAL HISTORY:  [name], a 85 y.o. 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 [name] was last seen on 11/16/2020 by Dr. Ni Gorusch.   Today she reports feeling well. She reports occasional fatigue and dizziness. She denies ankle swellings, nausea, and vomiting. She reports cramping and numbness in her right arm.   REVIEW OF SYSTEMS:  Review of Systems  Constitutional:  Positive for fatigue (50%). Negative for appetite change (80%).  HENT:   Positive for trouble swallowing.   Gastrointestinal:  Positive for constipation. Negative for nausea and vomiting.  Genitourinary:  Positive for dysuria.        UTI  Neurological:  Positive for dizziness and numbness (R arm and fingers).  Psychiatric/Behavioral:  Positive for sleep disturbance.   All other systems reviewed and are negative.  PAST MEDICAL/SURGICAL HISTORY:  Past Medical History:  Diagnosis Date   Bladder infection, chronic    Cancer (HCC) 02/2018   right breast cancer   Complication of anesthesia    GERD (gastroesophageal reflux disease)    Hemorrhoids    Hypercholesterolemia    Polymyalgia rheumatica (HCC)    PONV (postoperative nausea and vomiting)    S/P colonoscopy Jun 14, 2004   friable internal and external hemorrhoids, left-sided diverticula   S/P endoscopy August 13, 2006   pale 1-3 mm nodules consistent with benign squamous papilloma, tiny hiatal hernia   Serrated adenoma of  colon    Past Surgical History:  Procedure Laterality Date   ABDOMINAL HYSTERECTOMY     BREAST LUMPECTOMY WITH RADIOACTIVE SEED LOCALIZATION Right 03/12/2018   Procedure: RIGHT BREAST LUMPECTOMY WITH RADIOACTIVE SEED LOCALIZATION;  Surgeon: Ingram, Haywood, MD;  Location: [facility];  Service: General;  Laterality: Right;   CHOLECYSTECTOMY     COLONOSCOPY  11/20/2010   Dr. Rourk- serrated adenomaL side diverticulosis, hemorrhoids   COLONOSCOPY  06/14/04   friable internal and external hemorrhoids, left-sided diverticula   COLONOSCOPY N/A 08/23/2016   Dr. Rourk: Diverticulosis, medium-sized grade 2 internal hemorrhoids.   ESOPHAGOGASTRODUODENOSCOPY  08/13/06   pale 1-3 mm nodules consistent with benign squamous papilloma, tiny hiatal hernia   ESOPHAGOGASTRODUODENOSCOPY N/A 08/23/2016   Dr. Rourk: Normal   FLEXIBLE SIGMOIDOSCOPY  11/15/2011   Procedure: FLEXIBLE SIGMOIDOSCOPY;  Surgeon: Robert M Rourk, MD;  [facility];  Service: Endoscopy;  Laterality: N/A;  12:30PM   WRIST FRACTURE SURGERY Right     SOCIAL HISTORY:  Social History   Socioeconomic History   Marital status: Widowed    Spouse name: Not on file   Number of children: 2   Years of education: Not on file   Highest education level: Not on file  Occupational History   Not on file  Tobacco Use   Smoking status: Never   Smokeless tobacco: Never  Vaping Use   Vaping Use: Never used  Substance and Sexual Activity   Alcohol use: Yes      Alcohol/week: 2.0 standard drinks    Types: 2 Glasses of wine per week    Comment: Drinks wine 2-3 days per week ( usually a couple of glasses each time)   Drug use: No   Sexual activity: Not on file  Other Topics Concern   Not on file  Social History Narrative   Not on file   Social Determinants of Health   Financial Resource Strain: Low Risk    Difficulty of Paying Living Expenses: Not hard at all  Food Insecurity: No Food Insecurity   Worried About Paediatric nurse in the Last Year: Never true   Ran Out of Food in the Last Year: Never true  Transportation Needs: No Transportation Needs   Lack of Transportation (Medical): No   Lack of Transportation (Non-Medical): No  Physical Activity: Insufficiently Active   Days of Exercise per Week: 7 days   Minutes of Exercise per Session: 20 min  Stress: No Stress Concern Present   Feeling of Stress : Not at all  Social Connections: Moderately Isolated   Frequency of Communication with Friends and Family: Twice a week   Frequency of Social Gatherings with Friends and Family: Twice a week   Attends Religious Services: More than 4 times per year   Active Member of [employer] or Organizations: No   Attends Archivist Meetings: Never   Marital Status: Widowed  Human resources officer Violence: Not At Risk   Fear of Current or Ex-Partner: No   Emotionally Abused: No   Physically Abused: No   Sexually Abused: No    FAMILY HISTORY:  Family History  Problem Relation Age of Onset   Thyroid cancer Mother    Parkinson's disease Sister    Thyroid disease Brother    Aneurysm Sister    Thyroid cancer Other    Thyroid cancer Other    Colon cancer Neg Hx     CURRENT MEDICATIONS:  Current Outpatient Medications  Medication Sig Dispense Refill   Calcium Carb-Cholecalciferol (CALCIUM 600 + D PO) Take 2 tablets by mouth daily.     cephALEXin (KEFLEX) 500 MG capsule Take 1 capsule (500 mg total) by mouth 2 (two) times daily. 14 capsule 0   dasatinib (SPRYCEL) 20 MG tablet TAKE 1 TABLET (20 MG TOTAL) BY MOUTH DAILY. 30 tablet 3   magnesium 30 MG tablet Take 500 mg by mouth daily.     Multiple Vitamin (MULTIVITAMIN WITH MINERALS) TABS tablet Take 1 tablet by mouth daily.     phenazopyridine (PYRIDIUM) 200 MG tablet Take 1 tablet (200 mg total) by mouth 3 (three) times daily as needed for pain. 6 tablet 0   polyethylene glycol (MIRALAX / GLYCOLAX) packet Take 17 g by mouth daily as needed.     sucralfate  (CARAFATE) 1 GM/10ML suspension Take by mouth.     tamoxifen (NOLVADEX) 20 MG tablet TAKE 1 TABLET BY MOUTH DAILY. 90 tablet 0   Wheat Dextrin (BENEFIBER) POWD Take by mouth 2 (two) times daily. Takes 1 tbsp twice a day     No current facility-administered medications for this visit.    ALLERGIES:  Allergies  Allergen Reactions   Fish Allergy Nausea And Vomiting   Shrimp [Shellfish Allergy] Nausea And Vomiting    PHYSICAL EXAM:  Performance status (ECOG): 1 - Symptomatic but completely ambulatory  There were no vitals filed for this visit. Wt Readings from Last 3 Encounters:  11/16/20 150 lb 11.2 oz (68.4 kg)  09/29/20 152 lb 9.6  oz (69.2 kg)  08/18/20 154 lb 12.8 oz (70.2 kg)   Physical Exam Vitals reviewed.  Constitutional:      Appearance: Normal appearance.  Cardiovascular:     Rate and Rhythm: Normal rate and regular rhythm.     Pulses: Normal pulses.     Heart sounds: Normal heart sounds.  Pulmonary:     Effort: Pulmonary effort is normal.     Breath sounds: Normal breath sounds.  Abdominal:     Palpations: Abdomen is soft. There is no hepatomegaly, splenomegaly or mass.     Tenderness: There is no abdominal tenderness.  Musculoskeletal:     Right lower leg: No edema.     Left lower leg: No edema.  Lymphadenopathy:     Lower Body: No right inguinal adenopathy. No left inguinal adenopathy.  Neurological:     General: No focal deficit present.     Mental Status: She is alert and oriented to person, place, and time.  Psychiatric:        Mood and Affect: Mood normal.        Behavior: Behavior normal.     LABORATORY DATA:  I have reviewed the labs as listed.  CBC Latest Ref Rng & Units 02/21/2021 11/09/2020 09/22/2020  WBC 4.0 - 10.5 K/uL 4.2 3.5(L) 2.6(L)  Hemoglobin 12.0 - 15.0 g/dL 12.0 11.3(L) 12.3  Hematocrit 36.0 - 46.0 % 37.4 34.0(L) 36.0  Platelets 150 - 400 K/uL 175 214 215   CMP Latest Ref Rng & Units 02/21/2021 11/09/2020 09/22/2020  Glucose 70 - 99  mg/dL 102(H) 103(H) 94  BUN 8 - 23 mg/dL _0 Creatinine 0.44 - 1.00 mg/dL 0.83 0.77 0.85  Sodium 135 - 145 mmol/L 137 137 139  Potassium 3.5 - 5.1 mmol/L 3.8 3.9 4.1  Chloride 98 - 111 mmol/L 105 105 105  CO2 22 - 32 mmol/L _1 Calcium 8.9 - 10.3 mg/dL 8.4(L) 8.4(L) 8.9  Total Protein 6.5 - 8.1 g/dL 6.7 6.4(L) 6.6  Total Bilirubin 0.3 - 1.2 mg/dL 1.0 0.8 1.2  Alkaline Phos 38 - 126 U/L 37(L) 40 30(L)  AST 15 - 41 U/L _2 ALT 0 - 44 U/L _3 DIAGNOSTIC IMAGING:  I have independently reviewed the scans and discussed with the patient. MM DIAG BREAST TOMO BILATERAL  Result Date: 02/21/2021 CLINICAL DATA:  86 year old female with history of right breast cancer post lumpectomy 03/12/2018. EXAM: DIGITAL DIAGNOSTIC BILATERAL MAMMOGRAM WITH TOMOSYNTHESIS AND CAD TECHNIQUE: Bilateral digital diagnostic mammography and breast tomosynthesis was performed. The images were evaluated with computer-aided detection. COMPARISON:  Previous exams. ACR Breast Density Category b: There are scattered areas of fibroglandular density. FINDINGS: No suspicious masses or calcifications seen in either breast. Lumpectomy changes again identified in the lower inner posterior right breast. Spot compression magnification MLO view of the right breast lumpectomy site was performed. There is no mammographic evidence of locally recurrent malignancy. IMPRESSION: Stable lumpectomy changes in the right breast. No mammographic evidence of malignancy in either breast. RECOMMENDATION: Screening mammogram in one year.(Code:SM-B-01Y) The patient is now 2 or more years post lumpectomy and therefore she may return to annual screening mammography, however the patient does remain eligible for annual diagnostic mammography if preferred given history of breast cancer. I have discussed the findings and recommendations with the patient. If applicable, a reminder letter will be sent to the patient regarding the next  appointment. BI-RADS CATEGORY  2: Benign. Electronically Signed  By: Everlean Alstrom M.D.   On: 02/21/2021 09:59     ASSESSMENT:  1.  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  BCR/ABL by FISH on 02/23/2020 was positive for fusion protein. 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 -Chromosome analysis confirms Philadelphia chromosome. -BCR/ABL by quantitative PCR with B2 A2 transcript 132% -Dasatinib 50 mg daily started on 03/13/2020. - Sprycel dose reduced to 20 mg daily on 05/19/2020 secondary to leukopenia/neutropenia.   2.  Right breast DCIS: - Screening mammogram on 02/03/2018 was abnormal. -Ultrasound of the right breast on 02/11/2018 shows mass measuring 0.5 x 0.4 x 0.5 cm, circumscribed hypoechoic in the 3:30 o'clock location of the right breast.  Right axilla ultrasound was negative for adenopathy. - Right breast core biopsy on 02/18/2018 shows ductal carcinoma with papillary configuration.  In the comment section, it notes that this may represent DCIS involving a papillary lesion or it may represent an intracystic papillary ductal carcinoma.  ER/PR was 95% positive. - Right breast lumpectomy on 03/12/2018, pathology showing intermediate grade DCIS, 0.6 cm, with areas showing papillary configuration.  Posterior margin was 0.2 cm.  No evidence of invasion. - Radiation therapy was refused. -Tamoxifen for 5 years started on 04/09/2018. -Mammogram on 02/16/2020 was BI-RADS Category 2.   3.  Osteoporosis: -DEXA scan on 04/11/2018 with T score of -2.9. -DEXA scan on 05/26/2020 with T score -2.3.   PLAN:  1.  CML in chronic phase: - She is taking Dasatinib 20 mg daily. - She has some fatigue which is stable.  She also has occasional dizziness.  She does not report any falls. - Denies any nausea or vomiting or other GI side effects. - Reviewed labs from 02/21/2021 which showed white count 4.2, normal hemoglobin and platelet count.  LFTs  are grossly normal.  Electrolytes are normal. - She does not have any evidence of fluid retention. - Will check for BCR/ABL by quantitative PCR today.  Last BCR/ABL by PCR was undetectable in May. - Will monitor in 3 months with repeat BCR/ABL.   2.  Right breast DCIS: - Mammogram on 02/21/2021 was normal. - Continue tamoxifen for total of 5 years.   3.  Osteoporosis: - DEXA scan on 05/28/2020 showed improvement in bone density. - Continue calcium and vitamin D supplements.   Orders placed this encounter:  No orders of the defined types were placed in this encounter.    Derek Jack, MD [place] [phone]   I, Thana Ates, am acting as a scribe for Dr. Derek Jack.  I, Derek Jack MD, have reviewed the above documentation for accuracy and completeness, and I agree with the above.

## 2021-02-28 ENCOUNTER — Other Ambulatory Visit: Payer: Self-pay

## 2021-02-28 ENCOUNTER — Inpatient Hospital Stay (HOSPITAL_COMMUNITY): Payer: PPO

## 2021-02-28 ENCOUNTER — Inpatient Hospital Stay (HOSPITAL_BASED_OUTPATIENT_CLINIC_OR_DEPARTMENT_OTHER): Payer: PPO | Admitting: Hematology

## 2021-02-28 VITALS — BP 131/55 | HR 64 | Temp 97.6°F | Resp 16 | Wt 156.1 lb

## 2021-02-28 DIAGNOSIS — R42 Dizziness and giddiness: Secondary | ICD-10-CM | POA: Insufficient documentation

## 2021-02-28 DIAGNOSIS — E7800 Pure hypercholesterolemia, unspecified: Secondary | ICD-10-CM | POA: Diagnosis not present

## 2021-02-28 DIAGNOSIS — M353 Polymyalgia rheumatica: Secondary | ICD-10-CM | POA: Insufficient documentation

## 2021-02-28 DIAGNOSIS — C9210 Chronic myeloid leukemia, BCR/ABL-positive, not having achieved remission: Secondary | ICD-10-CM | POA: Insufficient documentation

## 2021-02-28 DIAGNOSIS — M810 Age-related osteoporosis without current pathological fracture: Secondary | ICD-10-CM | POA: Diagnosis not present

## 2021-02-28 DIAGNOSIS — D0511 Intraductal carcinoma in situ of right breast: Secondary | ICD-10-CM | POA: Diagnosis not present

## 2021-02-28 DIAGNOSIS — R5383 Other fatigue: Secondary | ICD-10-CM | POA: Insufficient documentation

## 2021-02-28 DIAGNOSIS — Z79810 Long term (current) use of selective estrogen receptor modulators (SERMs): Secondary | ICD-10-CM | POA: Diagnosis not present

## 2021-02-28 DIAGNOSIS — Z79899 Other long term (current) drug therapy: Secondary | ICD-10-CM | POA: Diagnosis not present

## 2021-02-28 DIAGNOSIS — K219 Gastro-esophageal reflux disease without esophagitis: Secondary | ICD-10-CM | POA: Diagnosis not present

## 2021-02-28 DIAGNOSIS — Z23 Encounter for immunization: Secondary | ICD-10-CM

## 2021-02-28 DIAGNOSIS — Z86010 Personal history of colonic polyps: Secondary | ICD-10-CM | POA: Insufficient documentation

## 2021-02-28 DIAGNOSIS — D61818 Other pancytopenia: Secondary | ICD-10-CM

## 2021-02-28 MED ORDER — INFLUENZA VAC A&B SA ADJ QUAD 0.5 ML IM PRSY
0.5000 mL | PREFILLED_SYRINGE | Freq: Once | INTRAMUSCULAR | Status: AC
  Administered 2021-02-28: 0.5 mL via INTRAMUSCULAR
  Filled 2021-02-28: qty 0.5

## 2021-02-28 NOTE — Progress Notes (Signed)
Patient is taking Sprycel and Tamoxifen as prescribed. She has not missed any doses and reports no side effects at this time.   

## 2021-02-28 NOTE — Progress Notes (Signed)
Patient is taking spyrcel and tamoxifen as prescribed.  She has not missed any doses and reports no side effects at this time.    [name] presents today for injection per the provider's orders.  Fluad administration without incident; injection site WNL; see MAR for injection details.  Patient tolerated procedure well and without incident. Patient remained in stable condition for entire visit.  No questions or complaints noted at this time. Patient discharged ambulatory and in stable condition.

## 2021-02-28 NOTE — Patient Instructions (Signed)
[place] [facility] Discharge Instructions   You were seen and examined today by Dr. Delton Coombes. Continue Sprycel and Tamoxifen as prescribed. Return as scheduled in 3 months for lab work and and office visit.    Thank you for choosing [place] at [facility] to provide your oncology and hematology care.  To afford each patient quality time with our provider, please arrive at least 15 minutes before your scheduled appointment time.   If you have a lab appointment with the [facility] please come in thru the Main Entrance and check in at the main information desk.  You need to re-schedule your appointment should you arrive 10 or more minutes late.  We strive to give you quality time with our providers, and arriving late affects you and other patients whose appointments are after yours.  Also, if you no show three or more times for appointments you may be dismissed from the clinic at the providers discretion.     Again, thank you for choosing [facility].  Our hope is that these requests will decrease the amount of time that you wait before being seen by our physicians.       _____________________________________________________________  Should you have questions after your visit to [facility], please contact our office at [phone] and follow the prompts.  Our office hours are 8:00 a.m. and 4:30 p.m. Monday - Friday.  Please note that voicemails left after 4:00 p.m. may not be returned until the following business day.  We are closed weekends and major holidays.  You do have access to a nurse 24-7, just call the main number to the clinic [phone] and do not press any options, hold on the line and a nurse will answer the phone.    For prescription refill requests, have your pharmacy contact our office and allow 72 hours.    Due to Covid, you will need to wear a mask upon entering the hospital. If you do not  have a mask, a mask will be given to you at the Main Entrance upon arrival. For doctor visits, patients may have 1 support person age 75 or older with them. For treatment visits, patients can not have anyone with them due to social distancing guidelines and our immunocompromised population.

## 2021-03-02 DIAGNOSIS — E782 Mixed hyperlipidemia: Secondary | ICD-10-CM | POA: Diagnosis not present

## 2021-03-07 LAB — BCR-ABL1, CML/ALL, PCR, QUANT: Interpretation (BCRAL):: NEGATIVE

## 2021-03-09 DIAGNOSIS — C9210 Chronic myeloid leukemia, BCR/ABL-positive, not having achieved remission: Secondary | ICD-10-CM | POA: Diagnosis not present

## 2021-03-09 DIAGNOSIS — D72819 Decreased white blood cell count, unspecified: Secondary | ICD-10-CM | POA: Diagnosis not present

## 2021-03-09 DIAGNOSIS — R7301 Impaired fasting glucose: Secondary | ICD-10-CM | POA: Diagnosis not present

## 2021-03-09 DIAGNOSIS — M810 Age-related osteoporosis without current pathological fracture: Secondary | ICD-10-CM | POA: Diagnosis not present

## 2021-03-09 DIAGNOSIS — M79601 Pain in right arm: Secondary | ICD-10-CM | POA: Diagnosis not present

## 2021-03-09 DIAGNOSIS — C50919 Malignant neoplasm of unspecified site of unspecified female breast: Secondary | ICD-10-CM | POA: Diagnosis not present

## 2021-03-09 DIAGNOSIS — E782 Mixed hyperlipidemia: Secondary | ICD-10-CM | POA: Diagnosis not present

## 2021-03-09 DIAGNOSIS — R809 Proteinuria, unspecified: Secondary | ICD-10-CM | POA: Diagnosis not present

## 2021-03-09 DIAGNOSIS — M791 Myalgia, unspecified site: Secondary | ICD-10-CM | POA: Diagnosis not present

## 2021-03-09 DIAGNOSIS — N390 Urinary tract infection, site not specified: Secondary | ICD-10-CM | POA: Diagnosis not present

## 2021-03-27 ENCOUNTER — Other Ambulatory Visit (HOSPITAL_COMMUNITY): Payer: Self-pay | Admitting: *Deleted

## 2021-03-27 DIAGNOSIS — C9210 Chronic myeloid leukemia, BCR/ABL-positive, not having achieved remission: Secondary | ICD-10-CM

## 2021-03-27 MED ORDER — DASATINIB 20 MG PO TABS: ORAL_TABLET | Freq: Every day | ORAL | 3 refills | Status: DC

## 2021-03-27 NOTE — Telephone Encounter (Signed)
Refill sent in for Sprycel 20 mg daily # 30 with 3 refills, per note patient is to continue this medication at above dose.

## 2021-05-11 ENCOUNTER — Encounter (HOSPITAL_COMMUNITY): Payer: Self-pay

## 2021-05-11 ENCOUNTER — Encounter (HOSPITAL_COMMUNITY)
Admission: RE | Admit: 2021-05-11 | Discharge: 2021-05-11 | Disposition: A | Discharge status: Home / Self Care | Payer: PPO | Source: Ambulatory Visit | Attending: Internal Medicine | Admitting: Internal Medicine

## 2021-05-11 DIAGNOSIS — M810 Age-related osteoporosis without current pathological fracture: Secondary | ICD-10-CM | POA: Insufficient documentation

## 2021-05-11 MED ORDER — DENOSUMAB 60 MG/ML ~~LOC~~ SOSY
60.0000 mg | PREFILLED_SYRINGE | Freq: Once | SUBCUTANEOUS | Status: AC
  Administered 2021-05-11: 60 mg via SUBCUTANEOUS
  Filled 2021-05-11: qty 1

## 2021-05-16 ENCOUNTER — Other Ambulatory Visit (HOSPITAL_COMMUNITY): Payer: Self-pay | Admitting: Hematology

## 2021-05-16 DIAGNOSIS — D0511 Intraductal carcinoma in situ of right breast: Secondary | ICD-10-CM

## 2021-05-22 ENCOUNTER — Inpatient Hospital Stay (HOSPITAL_COMMUNITY): Payer: PPO | Attending: Hematology

## 2021-05-22 ENCOUNTER — Other Ambulatory Visit: Payer: Self-pay

## 2021-05-22 DIAGNOSIS — D0511 Intraductal carcinoma in situ of right breast: Secondary | ICD-10-CM | POA: Diagnosis not present

## 2021-05-22 DIAGNOSIS — R42 Dizziness and giddiness: Secondary | ICD-10-CM | POA: Diagnosis not present

## 2021-05-22 DIAGNOSIS — C9210 Chronic myeloid leukemia, BCR/ABL-positive, not having achieved remission: Secondary | ICD-10-CM | POA: Insufficient documentation

## 2021-05-22 DIAGNOSIS — R5383 Other fatigue: Secondary | ICD-10-CM | POA: Insufficient documentation

## 2021-05-22 DIAGNOSIS — M810 Age-related osteoporosis without current pathological fracture: Secondary | ICD-10-CM | POA: Insufficient documentation

## 2021-05-22 LAB — CBC WITH DIFFERENTIAL/PLATELET
Abs Immature Granulocytes: 0.01 10*3/uL (ref 0.00–0.07)
Basophils Absolute: 0 10*3/uL (ref 0.0–0.1)
Basophils Relative: 1 %
Eosinophils Absolute: 0.2 10*3/uL (ref 0.0–0.5)
Eosinophils Relative: 6 %
HCT: 38 % (ref 36.0–46.0)
Hemoglobin: 12.5 g/dL (ref 12.0–15.0)
Immature Granulocytes: 0 %
Lymphocytes Relative: 39 %
Lymphs Abs: 1.3 10*3/uL (ref 0.7–4.0)
MCH: 32.1 pg (ref 26.0–34.0)
MCHC: 32.9 g/dL (ref 30.0–36.0)
MCV: 97.4 fL (ref 80.0–100.0)
Monocytes Absolute: 0.3 10*3/uL (ref 0.1–1.0)
Monocytes Relative: 8 %
Neutro Abs: 1.6 10*3/uL — ABNORMAL LOW (ref 1.7–7.7)
Neutrophils Relative %: 46 %
Platelets: 182 10*3/uL (ref 150–400)
RBC: 3.9 MIL/uL (ref 3.87–5.11)
RDW: 13.2 % (ref 11.5–15.5)
WBC: 3.4 10*3/uL — ABNORMAL LOW (ref 4.0–10.5)
nRBC: 0 % (ref 0.0–0.2)

## 2021-05-22 LAB — COMPREHENSIVE METABOLIC PANEL
ALT: 22 U/L (ref 0–44)
AST: 26 U/L (ref 15–41)
Albumin: 4.2 g/dL (ref 3.5–5.0)
Alkaline Phosphatase: 34 U/L — ABNORMAL LOW (ref 38–126)
Anion gap: 6 (ref 5–15)
BUN: 21 mg/dL (ref 8–23)
CO2: 25 mmol/L (ref 22–32)
Calcium: 8.2 mg/dL — ABNORMAL LOW (ref 8.9–10.3)
Chloride: 106 mmol/L (ref 98–111)
Creatinine, Ser: 0.86 mg/dL (ref 0.44–1.00)
GFR, Estimated: >60 mL/min (ref >60)
Glucose, Bld: 110 mg/dL — ABNORMAL HIGH (ref 70–99)
Potassium: 3.9 mmol/L (ref 3.5–5.1)
Sodium: 137 mmol/L (ref 135–145)
Total Bilirubin: 1 mg/dL (ref 0.3–1.2)
Total Protein: 6.8 g/dL (ref 6.5–8.1)

## 2021-05-22 LAB — PHOSPHORUS: Phosphorus: 3.2 mg/dL (ref 2.5–4.6)

## 2021-05-22 LAB — URIC ACID: Uric Acid, Serum: 4.9 mg/dL (ref 2.5–7.1)

## 2021-05-22 LAB — LACTATE DEHYDROGENASE: LDH: 161 U/L (ref 98–192)

## 2021-05-22 LAB — MAGNESIUM: Magnesium: 2.2 mg/dL (ref 1.7–2.4)

## 2021-05-26 ENCOUNTER — Other Ambulatory Visit (HOSPITAL_COMMUNITY): Payer: Self-pay

## 2021-05-26 DIAGNOSIS — C9210 Chronic myeloid leukemia, BCR/ABL-positive, not having achieved remission: Secondary | ICD-10-CM

## 2021-05-26 MED ORDER — DASATINIB 20 MG PO TABS: ORAL_TABLET | Freq: Every day | ORAL | 3 refills | Status: DC

## 2021-05-26 NOTE — Telephone Encounter (Signed)
Chart reviewed. Sprycal refill sent per last MD office visit.

## 2021-05-28 LAB — BCR-ABL1, CML/ALL, PCR, QUANT: Interpretation (BCRAL):: NEGATIVE

## 2021-05-29 ENCOUNTER — Telehealth (HOSPITAL_COMMUNITY): Payer: Self-pay | Admitting: Pharmacy Technician

## 2021-06-05 ENCOUNTER — Other Ambulatory Visit: Payer: Self-pay

## 2021-06-05 ENCOUNTER — Inpatient Hospital Stay (HOSPITAL_COMMUNITY): Payer: PPO | Admitting: Hematology

## 2021-06-05 VITALS — BP 142/61 | HR 75 | Temp 97.2°F | Resp 18 | Ht 66.0 in | Wt 156.6 lb

## 2021-06-05 DIAGNOSIS — C9210 Chronic myeloid leukemia, BCR/ABL-positive, not having achieved remission: Secondary | ICD-10-CM | POA: Diagnosis not present

## 2021-06-05 DIAGNOSIS — D61818 Other pancytopenia: Secondary | ICD-10-CM

## 2021-06-05 DIAGNOSIS — D0511 Intraductal carcinoma in situ of right breast: Secondary | ICD-10-CM | POA: Diagnosis not present

## 2021-06-05 NOTE — Progress Notes (Signed)
[name], [place] [address]   CLINIC:  Medical Oncology/Hematology  PCP:  Celene Squibb, MD [address] 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    CANCER STAGING:  Cancer Staging  No matching staging information was found for the patient.  INTERVAL HISTORY:  [name], a 87 y.o. 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 [name] was last seen on 02/28/2021.   Today she reports feeling good. She reports dizziness while walking; she denies any falls, but she is concerned she will fall. She is taking 1200 mg tablets of calcium BID. She reports occasional numbness in her right arm. She denies ankle swellings. She uses carafate prn.  REVIEW OF SYSTEMS:  Review of Systems  Constitutional:  Negative for appetite change and fatigue.  Cardiovascular:  Negative for leg swelling.  Gastrointestinal:  Positive for nausea.  Neurological:  Positive for dizziness and numbness.  Psychiatric/Behavioral:  Positive for sleep disturbance.   All other systems reviewed and are negative.  PAST MEDICAL/SURGICAL HISTORY:  Past Medical History:  Diagnosis Date   Bladder infection, chronic    Cancer ([facility]) 02/2018   right breast cancer   Complication of anesthesia    GERD (gastroesophageal reflux disease)    Hemorrhoids    Hypercholesterolemia    Polymyalgia rheumatica (HCC)    PONV (postoperative nausea and vomiting)    S/P colonoscopy Jun 14, 2004   friable internal and external hemorrhoids, left-sided diverticula   S/P endoscopy August 13, 2006   pale 1-3 mm nodules consistent with benign squamous papilloma, tiny hiatal hernia   Serrated adenoma of colon    Past Surgical History:  Procedure  Laterality Date   ABDOMINAL HYSTERECTOMY     BREAST LUMPECTOMY WITH RADIOACTIVE SEED LOCALIZATION Right 03/12/2018   Procedure: RIGHT BREAST LUMPECTOMY WITH RADIOACTIVE SEED LOCALIZATION;  Surgeon: Fanny Skates, MD;  Location: [facility];  Service: General;  Laterality: Right;   CHOLECYSTECTOMY     COLONOSCOPY  11/20/2010   Dr. Gala Romney- serrated adenomaL side diverticulosis, hemorrhoids   COLONOSCOPY  06/14/04   friable internal and external hemorrhoids, left-sided diverticula   COLONOSCOPY N/A 08/23/2016   Dr. Gala Romney: Diverticulosis, medium-sized grade 2 internal hemorrhoids.   ESOPHAGOGASTRODUODENOSCOPY  08/13/06   pale 1-3 mm nodules consistent with benign squamous papilloma, tiny hiatal hernia   ESOPHAGOGASTRODUODENOSCOPY N/A 08/23/2016   Dr. Gala Romney: Normal   FLEXIBLE SIGMOIDOSCOPY  11/15/2011   Procedure: FLEXIBLE SIGMOIDOSCOPY;  Surgeon: Daneil Dolin, MD;  [facility];  Service: Endoscopy;  Laterality: N/A;  12:30PM   WRIST FRACTURE SURGERY Right     SOCIAL HISTORY:  Social History   Socioeconomic History   Marital status: Widowed    Spouse name: Not on file   Number of children: 2   Years of education: Not on file   Highest education level: Not on file  Occupational History   Not on file  Tobacco Use   Smoking status: Never   Smokeless tobacco: Never  Vaping Use   Vaping Use: Never used  Substance and Sexual Activity   Alcohol use: Yes    Alcohol/week: 2.0 standard drinks    Types: 2 Glasses of wine  per week    Comment: Drinks wine 2-3 days per week ( usually a couple of glasses each time)   Drug use: No   Sexual activity: Not on file  Other Topics Concern   Not on file  Social History Narrative   Not on file   Social Determinants of Health   Financial Resource Strain: Not on file  Food Insecurity: Not on file  Transportation Needs: Not on file  Physical Activity: Not on file  Stress: Not on file  Social Connections: Not on file   Intimate Partner Violence: Not on file    FAMILY HISTORY:  Family History  Problem Relation Age of Onset   Thyroid cancer Mother    Parkinson's disease Sister    Thyroid disease Brother    Aneurysm Sister    Thyroid cancer Other    Thyroid cancer Other    Colon cancer Neg Hx     CURRENT MEDICATIONS:  Current Outpatient Medications  Medication Sig Dispense Refill   Calcium Carb-Cholecalciferol (CALCIUM 600 + D PO) Take 2 tablets by mouth daily.     cephALEXin (KEFLEX) 500 MG capsule Take 1 capsule (500 mg total) by mouth 2 (two) times daily. 14 capsule 0   dasatinib (SPRYCEL) 20 MG tablet TAKE 1 TABLET (20 MG TOTAL) BY MOUTH DAILY. 30 tablet 3   magnesium 30 MG tablet Take 500 mg by mouth daily.     Multiple Vitamin (MULTIVITAMIN WITH MINERALS) TABS tablet Take 1 tablet by mouth daily.     polyethylene glycol (MIRALAX / GLYCOLAX) packet Take 17 g by mouth daily as needed.     sucralfate (CARAFATE) 1 GM/10ML suspension Take by mouth.     tamoxifen (NOLVADEX) 20 MG tablet TAKE 1 TABLET BY MOUTH DAILY. 90 tablet 1   Wheat Dextrin (BENEFIBER) POWD Take by mouth 2 (two) times daily. Takes 1 tbsp twice a day     No current facility-administered medications for this visit.    ALLERGIES:  Allergies  Allergen Reactions   Fish Allergy Nausea And Vomiting   Shrimp [Shellfish Allergy] Nausea And Vomiting    PHYSICAL EXAM:  Performance status (ECOG): 1 - Symptomatic but completely ambulatory  Vitals:   06/05/21 1358  BP: (!) 142/61  Pulse: 75  Resp: 18  Temp: (!) 97.2 F (36.2 C)  SpO2: 96%   Wt Readings from Last 3 Encounters:  06/05/21 156 lb 9.6 oz (71 kg)  05/11/21 150 lb (68 kg)  02/28/21 156 lb 1.6 oz (70.8 kg)   Physical Exam Vitals reviewed.  Constitutional:      Appearance: Normal appearance.  Cardiovascular:     Rate and Rhythm: Normal rate and regular rhythm.     Pulses: Normal pulses.     Heart sounds: Normal heart sounds.  Pulmonary:     Effort:  Pulmonary effort is normal.     Breath sounds: Normal breath sounds.  Musculoskeletal:     Right lower leg: No edema.     Left lower leg: No edema.  Neurological:     General: No focal deficit present.     Mental Status: She is alert and oriented to person, place, and time.  Psychiatric:        Mood and Affect: Mood normal.        Behavior: Behavior normal.     LABORATORY DATA:  I have reviewed the labs as listed.  CBC Latest Ref Rng & Units 05/22/2021 02/21/2021 11/09/2020  WBC 4.0 - 10.5 K/uL 3.4(L)  4.2 3.5(L)  Hemoglobin 12.0 - 15.0 g/dL 12.5 12.0 11.3(L)  Hematocrit 36.0 - 46.0 % 38.0 37.4 34.0(L)  Platelets 150 - 400 K/uL 182 175 214   CMP Latest Ref Rng & Units 05/22/2021 02/21/2021 11/09/2020  Glucose 70 - 99 mg/dL 110(H) 102(H) 103(H)  BUN 8 - 23 mg/dL _0 Creatinine 0.44 - 1.00 mg/dL 0.86 0.83 0.77  Sodium 135 - 145 mmol/L 137 137 137  Potassium 3.5 - 5.1 mmol/L 3.9 3.8 3.9  Chloride 98 - 111 mmol/L 106 105 105  CO2 22 - 32 mmol/L _1 Calcium 8.9 - 10.3 mg/dL 8.2(L) 8.4(L) 8.4(L)  Total Protein 6.5 - 8.1 g/dL 6.8 6.7 6.4(L)  Total Bilirubin 0.3 - 1.2 mg/dL 1.0 1.0 0.8  Alkaline Phos 38 - 126 U/L 34(L) 37(L) 40  AST 15 - 41 U/L _2 ALT 0 - 44 U/L _3 DIAGNOSTIC IMAGING:  I have independently reviewed the scans and discussed with the patient. No results found.   ASSESSMENT:  1.  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  BCR/ABL by FISH on 02/23/2020 was positive for fusion protein. 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 -Chromosome analysis confirms [place] chromosome. -BCR/ABL by quantitative PCR with B2 A2 transcript 132% -Dasatinib 50 mg daily started on 03/13/2020. - Sprycel dose reduced to 20 mg daily on 05/19/2020 secondary to leukopenia/neutropenia.   2.  Right breast DCIS: - Screening mammogram on 02/03/2018 was abnormal. -Ultrasound of the right breast on 02/11/2018  shows mass measuring 0.5 x 0.4 x 0.5 cm, circumscribed hypoechoic in the 3:30 o'clock location of the right breast.  Right axilla ultrasound was negative for adenopathy. - Right breast core biopsy on 02/18/2018 shows ductal carcinoma with papillary configuration.  In the comment section, it notes that this may represent DCIS involving a papillary lesion or it may represent an intracystic papillary ductal carcinoma.  ER/PR was 95% positive. - Right breast lumpectomy on 03/12/2018, pathology showing intermediate grade DCIS, 0.6 cm, with areas showing papillary configuration.  Posterior margin was 0.2 cm.  No evidence of invasion. - Radiation therapy was refused. -Tamoxifen for 5 years started on 04/09/2018. -Mammogram on 02/16/2020 was BI-RADS Category 2.   3.  Osteoporosis: -DEXA scan on 04/11/2018 with T score of -2.9. -DEXA scan on 05/26/2020 with T score -2.3.   PLAN:  1.  CML in chronic phase: - She is taking Dasatinib 20 mg daily. - Denies any GI side effects. - She reports occasional dizziness but denies any falls.  She also reported some fatigue. - We have reviewed labs which showed normal LFTs.  CBC shows white count 3.4 with ANC of 1.6.  Platelet count and hemoglobin are normal. - BCR/ABL by quantitative PCR on 05/22/2021 was negative. - Because of her tiredness and dizziness, recommend decreasing Dasatinib to 20 mg daily Monday through Friday. - RTC 3 months with repeat labs and BCR/ABL by PCR.   2.  Right breast DCIS: - Last mammogram on 02/21/2021 was normal. - Continue tamoxifen for 5 years.   3.  Osteoporosis: - DEXA scan on 05/28/2020 showed improvement in bone density. - Continue calcium and vitamin D supplements.  She takes calcium 1200 mg twice daily. - Dr. Nevada Crane started her on Prolia, last dose on 05/11/2021.   Orders placed this encounter:  No orders of the defined types were placed in this encounter.    Derek Jack,  MD [facility] [phone]   I, Thana Ates, am acting as a scribe for Dr. Derek Jack.  I, Derek Jack MD, have reviewed the above documentation for accuracy and completeness, and I agree with the above.

## 2021-06-05 NOTE — Patient Instructions (Addendum)
[place] at [facility] Discharge Instructions  You were seen and examined today by Dr. Delton Coombes. He reviewed your most recent labs and everything looks good. Start taking the Dasatinib 20 mg Monday- Friday. Please keep follow up appointment as scheduled in 3 months.   Thank you for choosing [place] at [facility] to provide your oncology and hematology care.  To afford each patient quality time with our provider, please arrive at least 15 minutes before your scheduled appointment time.   If you have a lab appointment with the [facility] please come in thru the Main Entrance and check in at the main information desk.  You need to re-schedule your appointment should you arrive 10 or more minutes late.  We strive to give you quality time with our providers, and arriving late affects you and other patients whose appointments are after yours.  Also, if you no show three or more times for appointments you may be dismissed from the clinic at the providers discretion.     Again, thank you for choosing [facility].  Our hope is that these requests will decrease the amount of time that you wait before being seen by our physicians.       _____________________________________________________________  Should you have questions after your visit to [facility], please contact our office at [phone] and follow the prompts.  Our office hours are 8:00 a.m. and 4:30 p.m. Monday - Friday.  Please note that voicemails left after 4:00 p.m. may not be returned until the following business day.  We are closed weekends and major holidays.  You do have access to a nurse 24-7, just call the main number to the clinic [phone] and do not press any options, hold on the line and a nurse will answer the phone.    For prescription refill requests, have your pharmacy contact our office and allow 72 hours.    Due to Covid, you will need to  wear a mask upon entering the hospital. If you do not have a mask, a mask will be given to you at the Main Entrance upon arrival. For doctor visits, patients may have 1 support person age 8 or older with them. For treatment visits, patients can not have anyone with them due to social distancing guidelines and our immunocompromised population.

## 2021-06-07 DIAGNOSIS — C799 Secondary malignant neoplasm of unspecified site: Secondary | ICD-10-CM | POA: Diagnosis not present

## 2021-06-07 DIAGNOSIS — D8481 Immunodeficiency due to conditions classified elsewhere: Secondary | ICD-10-CM | POA: Diagnosis not present

## 2021-06-07 DIAGNOSIS — C50919 Malignant neoplasm of unspecified site of unspecified female breast: Secondary | ICD-10-CM | POA: Diagnosis not present

## 2021-06-07 DIAGNOSIS — R32 Unspecified urinary incontinence: Secondary | ICD-10-CM | POA: Diagnosis not present

## 2021-06-07 DIAGNOSIS — M810 Age-related osteoporosis without current pathological fracture: Secondary | ICD-10-CM | POA: Diagnosis not present

## 2021-06-07 DIAGNOSIS — E785 Hyperlipidemia, unspecified: Secondary | ICD-10-CM | POA: Diagnosis not present

## 2021-06-07 DIAGNOSIS — Z9849 Cataract extraction status, unspecified eye: Secondary | ICD-10-CM | POA: Diagnosis not present

## 2021-06-07 DIAGNOSIS — C9510 Chronic leukemia of unspecified cell type not having achieved remission: Secondary | ICD-10-CM | POA: Diagnosis not present

## 2021-06-07 DIAGNOSIS — I10 Essential (primary) hypertension: Secondary | ICD-10-CM | POA: Diagnosis not present

## 2021-06-12 NOTE — Telephone Encounter (Signed)
Oral Oncology Patient Advocate Encounter  Received notification from [facility] that patient has been successfully enrolled into their program to receive Sprycel from the manufacturer at $0 out of pocket until 05/06/22.    I called and spoke with patient.  She knows we will have to re-apply.   Specialty Pharmacy that will dispense medication is [facility].  Patient knows to call the office with questions or concerns.   Oral Oncology Clinic will continue to follow.  [facility] Patient [place] Phone [phone] Fax [phone] 06/12/2021 12:28 PM

## 2021-06-30 ENCOUNTER — Other Ambulatory Visit (HOSPITAL_COMMUNITY): Payer: Self-pay

## 2021-08-29 ENCOUNTER — Other Ambulatory Visit (HOSPITAL_COMMUNITY): Payer: Self-pay | Admitting: *Deleted

## 2021-08-29 DIAGNOSIS — C9210 Chronic myeloid leukemia, BCR/ABL-positive, not having achieved remission: Secondary | ICD-10-CM

## 2021-08-29 MED ORDER — DASATINIB 20 MG PO TABS: ORAL_TABLET | Freq: Every day | ORAL | 3 refills | Status: DC

## 2021-08-29 NOTE — Telephone Encounter (Signed)
Refill sent on Sprycel per request.  Per ovn, patient is to continue on therapy and is tolerating well. ?

## 2021-09-01 DIAGNOSIS — E782 Mixed hyperlipidemia: Secondary | ICD-10-CM | POA: Diagnosis not present

## 2021-09-01 DIAGNOSIS — R7301 Impaired fasting glucose: Secondary | ICD-10-CM | POA: Diagnosis not present

## 2021-09-06 DIAGNOSIS — E782 Mixed hyperlipidemia: Secondary | ICD-10-CM | POA: Diagnosis not present

## 2021-09-06 DIAGNOSIS — M810 Age-related osteoporosis without current pathological fracture: Secondary | ICD-10-CM | POA: Diagnosis not present

## 2021-09-06 DIAGNOSIS — C9210 Chronic myeloid leukemia, BCR/ABL-positive, not having achieved remission: Secondary | ICD-10-CM | POA: Diagnosis not present

## 2021-09-06 DIAGNOSIS — M791 Myalgia, unspecified site: Secondary | ICD-10-CM | POA: Diagnosis not present

## 2021-09-06 DIAGNOSIS — R7301 Impaired fasting glucose: Secondary | ICD-10-CM | POA: Diagnosis not present

## 2021-09-06 DIAGNOSIS — R809 Proteinuria, unspecified: Secondary | ICD-10-CM | POA: Diagnosis not present

## 2021-09-06 DIAGNOSIS — D72819 Decreased white blood cell count, unspecified: Secondary | ICD-10-CM | POA: Diagnosis not present

## 2021-09-06 DIAGNOSIS — C50919 Malignant neoplasm of unspecified site of unspecified female breast: Secondary | ICD-10-CM | POA: Diagnosis not present

## 2021-09-06 DIAGNOSIS — N390 Urinary tract infection, site not specified: Secondary | ICD-10-CM | POA: Diagnosis not present

## 2021-09-12 ENCOUNTER — Inpatient Hospital Stay (HOSPITAL_COMMUNITY): Payer: PPO | Attending: Hematology

## 2021-09-12 DIAGNOSIS — Z90710 Acquired absence of both cervix and uterus: Secondary | ICD-10-CM | POA: Insufficient documentation

## 2021-09-12 DIAGNOSIS — Z808 Family history of malignant neoplasm of other organs or systems: Secondary | ICD-10-CM | POA: Diagnosis not present

## 2021-09-12 DIAGNOSIS — M810 Age-related osteoporosis without current pathological fracture: Secondary | ICD-10-CM | POA: Insufficient documentation

## 2021-09-12 DIAGNOSIS — Z853 Personal history of malignant neoplasm of breast: Secondary | ICD-10-CM | POA: Diagnosis not present

## 2021-09-12 DIAGNOSIS — C9210 Chronic myeloid leukemia, BCR/ABL-positive, not having achieved remission: Secondary | ICD-10-CM | POA: Insufficient documentation

## 2021-09-12 DIAGNOSIS — D0511 Intraductal carcinoma in situ of right breast: Secondary | ICD-10-CM

## 2021-09-12 DIAGNOSIS — D61818 Other pancytopenia: Secondary | ICD-10-CM

## 2021-09-12 LAB — CBC WITH DIFFERENTIAL/PLATELET
Abs Immature Granulocytes: 0.01 10*3/uL (ref 0.00–0.07)
Basophils Absolute: 0 10*3/uL (ref 0.0–0.1)
Basophils Relative: 1 %
Eosinophils Absolute: 0.1 10*3/uL (ref 0.0–0.5)
Eosinophils Relative: 3 %
HCT: 37.7 % (ref 36.0–46.0)
Hemoglobin: 12.2 g/dL (ref 12.0–15.0)
Immature Granulocytes: 0 %
Lymphocytes Relative: 43 %
Lymphs Abs: 1.3 10*3/uL (ref 0.7–4.0)
MCH: 31.1 pg (ref 26.0–34.0)
MCHC: 32.4 g/dL (ref 30.0–36.0)
MCV: 96.2 fL (ref 80.0–100.0)
Monocytes Absolute: 0.3 10*3/uL (ref 0.1–1.0)
Monocytes Relative: 9 %
Neutro Abs: 1.3 10*3/uL — ABNORMAL LOW (ref 1.7–7.7)
Neutrophils Relative %: 44 %
Platelets: 144 10*3/uL — ABNORMAL LOW (ref 150–400)
RBC: 3.92 MIL/uL (ref 3.87–5.11)
RDW: 13.2 % (ref 11.5–15.5)
WBC: 2.9 10*3/uL — ABNORMAL LOW (ref 4.0–10.5)
nRBC: 0 % (ref 0.0–0.2)

## 2021-09-12 LAB — COMPREHENSIVE METABOLIC PANEL
ALT: 19 U/L (ref 0–44)
AST: 25 U/L (ref 15–41)
Albumin: 3.9 g/dL (ref 3.5–5.0)
Alkaline Phosphatase: 32 U/L — ABNORMAL LOW (ref 38–126)
Anion gap: 5 (ref 5–15)
BUN: 17 mg/dL (ref 8–23)
CO2: 25 mmol/L (ref 22–32)
Calcium: 8.4 mg/dL — ABNORMAL LOW (ref 8.9–10.3)
Chloride: 109 mmol/L (ref 98–111)
Creatinine, Ser: 0.77 mg/dL (ref 0.44–1.00)
GFR, Estimated: >60 mL/min (ref >60)
Glucose, Bld: 105 mg/dL — ABNORMAL HIGH (ref 70–99)
Potassium: 3.9 mmol/L (ref 3.5–5.1)
Sodium: 139 mmol/L (ref 135–145)
Total Bilirubin: 1.1 mg/dL (ref 0.3–1.2)
Total Protein: 6.6 g/dL (ref 6.5–8.1)

## 2021-09-18 LAB — BCR-ABL1, CML/ALL, PCR, QUANT: Interpretation (BCRAL):: NEGATIVE

## 2021-09-19 ENCOUNTER — Inpatient Hospital Stay (HOSPITAL_COMMUNITY): Payer: PPO | Admitting: Hematology

## 2021-09-19 VITALS — BP 138/63 | HR 66 | Temp 98.2°F | Resp 17 | Ht 66.0 in | Wt 153.5 lb

## 2021-09-19 DIAGNOSIS — C9210 Chronic myeloid leukemia, BCR/ABL-positive, not having achieved remission: Secondary | ICD-10-CM

## 2021-09-19 DIAGNOSIS — D0511 Intraductal carcinoma in situ of right breast: Secondary | ICD-10-CM | POA: Diagnosis not present

## 2021-09-19 NOTE — Patient Instructions (Signed)
[name] at [facility] ?Discharge Instructions ? ? ?You were seen and examined today by Dr. Delton Coombes. ? ?He reviewed your lab work which was normal. ? ?Continue Sprycel as you have been taking. ? ?Return as scheduled in 3 months.  ? ? ?Thank you for choosing [place] at [facility] to provide your oncology and hematology care.  To afford each patient quality time with our provider, please arrive at least 15 minutes before your scheduled appointment time.  ? ?If you have a lab appointment with the [facility] please come in thru the Main Entrance and check in at the main information desk. ? ?You need to re-schedule your appointment should you arrive 10 or more minutes late.  We strive to give you quality time with our providers, and arriving late affects you and other patients whose appointments are after yours.  Also, if you no show three or more times for appointments you may be dismissed from the clinic at the providers discretion.     ?Again, thank you for choosing [facility].  Our hope is that these requests will decrease the amount of time that you wait before being seen by our physicians.       ?_____________________________________________________________ ? ?Should you have questions after your visit to [facility], please contact our office at [phone] and follow the prompts.  Our office hours are 8:00 a.m. and 4:30 p.m. Monday - Friday.  Please note that voicemails left after 4:00 p.m. may not be returned until the following business day.  We are closed weekends and major holidays.  You do have access to a nurse 24-7, just call the main number to the clinic [phone] and do not press any options, hold on the line and a nurse will answer the phone.   ? ?For prescription refill requests, have your pharmacy contact our office and allow 72 hours.   ? ?Due to Covid, you will need to wear a mask upon entering the hospital.  If you do not have a mask, a mask will be given to you at the Main Entrance upon arrival. For doctor visits, patients may have 1 support person age 72 or older with them. For treatment visits, patients can not have anyone with them due to social distancing guidelines and our immunocompromised population.  ? ?   ?

## 2021-09-19 NOTE — Progress Notes (Signed)
? ?[facility] ?[address] ?[place], [place] [address] ? ? ?CLINIC:  ?Medical Oncology/Hematology ? ?PCP:  ?Celene Squibb, MD ?[address] [place] [address] ?[phone] ? ? 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 ?PRIOR THERAPY: none ? ?NGS Results: not done ? ?CURRENT THERAPY: Dasatinib 20 mg daily ? ?INTERVAL HISTORY:  ?[name], a 87 y.o. 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 [name] was last seen on 06/05/2021.  ? ?Today she reports feeling good. She is taking tamoxifen and tolerating it well. She denies fatigue, SOB, and CP. She is taking Dasatinib and is tolerating it well. She denies n/v/d. ? ?REVIEW OF SYSTEMS:  ?Review of Systems  ?Constitutional:  Negative for appetite change and fatigue.  ?Respiratory:  Negative for shortness of breath.   ?Cardiovascular:  Negative for chest pain.  ?Gastrointestinal:  Negative for diarrhea, nausea and vomiting.  ?Genitourinary:  Positive for frequency.   ?Neurological:  Positive for dizziness and numbness (tingling R arm).  ?Psychiatric/Behavioral:  Positive for sleep disturbance.   ?All other systems reviewed and are negative. ? ?PAST MEDICAL/SURGICAL HISTORY:  ?Past Medical History:  ?Diagnosis Date  ? Bladder infection, chronic   ? Cancer ([facility]) 02/2018  ? right breast cancer  ? Complication of anesthesia   ? GERD (gastroesophageal reflux disease)   ? Hemorrhoids   ? Hypercholesterolemia   ? Polymyalgia rheumatica ([facility])   ? PONV (postoperative nausea and vomiting)   ? S/P colonoscopy Jun 14, 2004  ? friable internal and external hemorrhoids, left-sided diverticula  ? S/P endoscopy August 13, 2006  ? pale 1-3 mm nodules consistent with benign squamous papilloma, tiny hiatal hernia  ? Serrated adenoma of colon   ? ?Past Surgical History:  ?Procedure Laterality Date  ? ABDOMINAL HYSTERECTOMY    ? BREAST LUMPECTOMY WITH RADIOACTIVE SEED LOCALIZATION Right 03/12/2018  ? Procedure: RIGHT  BREAST LUMPECTOMY WITH RADIOACTIVE SEED LOCALIZATION;  Surgeon: Fanny Skates, MD;  Location: [facility];  Service: General;  Laterality: Right;  ? CHOLECYSTECTOMY    ? COLONOSCOPY  11/20/2010  ? Dr. Gala Romney- serrated adenomaL side diverticulosis, hemorrhoids  ? COLONOSCOPY  06/14/04  ? friable internal and external hemorrhoids, left-sided diverticula  ? COLONOSCOPY N/A 08/23/2016  ? Dr. Gala Romney: Diverticulosis, medium-sized grade 2 internal hemorrhoids.  ? ESOPHAGOGASTRODUODENOSCOPY  08/13/06  ? pale 1-3 mm nodules consistent with benign squamous papilloma, tiny hiatal hernia  ? ESOPHAGOGASTRODUODENOSCOPY N/A 08/23/2016  ? Dr. Gala Romney: Normal  ? FLEXIBLE SIGMOIDOSCOPY  11/15/2011  ? Procedure: FLEXIBLE SIGMOIDOSCOPY;  Surgeon: Daneil Dolin, MD;  [facility];  Service: Endoscopy;  Laterality: N/A;  12:30PM  ? WRIST FRACTURE SURGERY Right   ? ? ?SOCIAL HISTORY:  ?Social History  ? ?Socioeconomic History  ? Marital status: Widowed  ?  Spouse name: Not on file  ? Number of children: 2  ? Years of education: Not on file  ? Highest education level: Not on file  ?Occupational History  ? Not on file  ?Tobacco Use  ? Smoking status: Never  ? Smokeless tobacco: Never  ?Vaping Use  ? Vaping Use: Never used  ?Substance and Sexual Activity  ? Alcohol use: Yes  ?  Alcohol/week: 2.0 standard drinks  ?  Types: 2 Glasses of wine per week  ?  Comment: Drinks wine 2-3 days per week ( usually a couple of glasses each time)  ? Drug  use: No  ? Sexual activity: Not on file  ?Other Topics Concern  ? Not on file  ?Social History Narrative  ? Not on file  ? ?Social Determinants of Health  ? ?Financial Resource Strain: Not on file  ?Food Insecurity: Not on file  ?Transportation Needs: Not on file  ?Physical Activity: Not on file  ?Stress: Not on file  ?Social Connections: Not on file  ?Intimate Partner Violence: Not on file  ? ? ?FAMILY HISTORY:  ?Family History  ?Problem Relation Age of Onset  ? Thyroid cancer Mother   ?  Parkinson's disease Sister   ? Thyroid disease Brother   ? Aneurysm Sister   ? Thyroid cancer Other   ? Thyroid cancer Other   ? Colon cancer Neg Hx   ? ? ?CURRENT MEDICATIONS:  ?Current Outpatient Medications  ?Medication Sig Dispense Refill  ? Calcium Carb-Cholecalciferol (CALCIUM 600 + D PO) Take 2 tablets by mouth daily.    ? dasatinib (SPRYCEL) 20 MG tablet TAKE 1 TABLET (20 MG TOTAL) BY MOUTH DAILY. 30 tablet 3  ? magnesium 30 MG tablet Take 500 mg by mouth daily.    ? Multiple Vitamin (MULTIVITAMIN WITH MINERALS) TABS tablet Take 1 tablet by mouth daily.    ? polyethylene glycol (MIRALAX / GLYCOLAX) packet Take 17 g by mouth daily as needed.    ? sucralfate (CARAFATE) 1 GM/10ML suspension Take by mouth.    ? tamoxifen (NOLVADEX) 20 MG tablet TAKE 1 TABLET BY MOUTH DAILY. 90 tablet 1  ? Wheat Dextrin (BENEFIBER) POWD Take by mouth 2 (two) times daily. Takes 1 tbsp twice a day    ? ?No current facility-administered medications for this visit.  ? ? ?ALLERGIES:  ?Allergies  ?Allergen Reactions  ? Fish Allergy Nausea And Vomiting  ? Shrimp [Shellfish Allergy] Nausea And Vomiting  ? ? ?PHYSICAL EXAM:  ?Performance status (ECOG): 1 - Symptomatic but completely ambulatory ? ?There were no vitals filed for this visit. ?Wt Readings from Last 3 Encounters:  ?06/05/21 156 lb 9.6 oz (71 kg)  ?05/11/21 150 lb (68 kg)  ?02/28/21 156 lb 1.6 oz (70.8 kg)  ? ?Physical Exam ?Vitals reviewed.  ?Constitutional:   ?   Appearance: Normal appearance.  ?Cardiovascular:  ?   Rate and Rhythm: Normal rate and regular rhythm.  ?   Pulses: Normal pulses.  ?   Heart sounds: Normal heart sounds.  ?Pulmonary:  ?   Effort: Pulmonary effort is normal.  ?   Breath sounds: Normal breath sounds.  ?Abdominal:  ?   Palpations: Abdomen is soft. There is no hepatomegaly, splenomegaly or mass.  ?   Tenderness: There is no abdominal tenderness.  ?Musculoskeletal:  ?   Right lower leg: No edema.  ?   Left lower leg: No edema.  ?Neurological:  ?    General: No focal deficit present.  ?   Mental Status: She is alert and oriented to person, place, and time.  ?Psychiatric:     ?   Mood and Affect: Mood normal.     ?   Behavior: Behavior normal.  ?  ? ?LABORATORY DATA:  ?I have reviewed the labs as listed.  ? ?  Latest Ref Rng & Units 09/12/2021  ?  8:15 AM 05/22/2021  ? 10:24 AM 02/21/2021  ?  8:31 AM  ?CBC  ?WBC 4.0 - 10.5 K/uL 2.9   3.4   4.2    ?Hemoglobin 12.0 - 15.0 g/dL 12.2   12.5  12.0    ?Hematocrit 36.0 - 46.0 % 37.7   38.0   37.4    ?Platelets 150 - 400 K/uL 144   182   175    ? ? ?  Latest Ref Rng & Units 09/12/2021  ?  8:15 AM 05/22/2021  ? 10:24 AM 02/21/2021  ?  8:31 AM  ?CMP  ?Glucose 70 - 99 mg/dL 105   110   102    ?BUN 8 - 23 mg/dL _0 ?Creatinine 0.44 - 1.00 mg/dL 0.77   0.86   0.83    ?Sodium 135 - 145 mmol/L 139   137   137    ?Potassium 3.5 - 5.1 mmol/L 3.9   3.9   3.8    ?Chloride 98 - 111 mmol/L 109   106   105    ?CO2 22 - 32 mmol/L _1 ?Calcium 8.9 - 10.3 mg/dL 8.4   8.2   8.4    ?Total Protein 6.5 - 8.1 g/dL 6.6   6.8   6.7    ?Total Bilirubin 0.3 - 1.2 mg/dL 1.1   1.0   1.0    ?Alkaline Phos 38 - 126 U/L 32   34   37    ?AST 15 - 41 U/L _2 ?ALT 0 - 44 U/L _3 ? ? ?DIAGNOSTIC IMAGING:  ?I have independently reviewed the scans and discussed with the patient. ?No results found.  ? ?ASSESSMENT:  ?1.  CML in chronic phase: ?-Evaluated for elevated white count.  BCR/ABL by FISH on 02/23/2020 was positive for fusion protein. ?-Bone marrow biopsy on 03/10/2020-hypercellular marrow with CML in chronic phase. ?-Chromosome analysis confirms [place] chromosome. ?-BCR/ABL by quantitative PCR with B2 A2 transcript 132% ?-Dasatinib 50 mg daily started on 03/13/2020. ?- Sprycel dose reduced to 20 mg daily on 05/19/2020 secondary to leukopenia/neutropenia. ?- Sprycel dose changed to 20 mg daily Monday through Friday due to tiredness and dizziness on 06/05/2021. ?  ?2.  Right breast DCIS: ?- Screening  mammogram on 02/03/2018 was abnormal. ?-Ultrasound of the right breast on 02/11/2018 shows mass measuring 0.5 x 0.4 x 0.5 cm, circumscribed hypoechoic in the 3:30 o'clock location of the right breast.  Right axilla ult

## 2021-09-20 ENCOUNTER — Other Ambulatory Visit (HOSPITAL_COMMUNITY): Payer: Self-pay | Admitting: *Deleted

## 2021-09-20 DIAGNOSIS — C9210 Chronic myeloid leukemia, BCR/ABL-positive, not having achieved remission: Secondary | ICD-10-CM

## 2021-11-08 ENCOUNTER — Ambulatory Visit (HOSPITAL_COMMUNITY): Payer: PPO

## 2021-11-20 ENCOUNTER — Emergency Department (HOSPITAL_COMMUNITY): Payer: PPO

## 2021-11-20 ENCOUNTER — Emergency Department (HOSPITAL_COMMUNITY)
Admission: EM | Admit: 2021-11-20 | Discharge: 2021-11-20 | Disposition: A | Discharge status: Home / Self Care | Payer: PPO | Attending: Emergency Medicine | Admitting: Emergency Medicine

## 2021-11-20 ENCOUNTER — Encounter (HOSPITAL_COMMUNITY): Payer: Self-pay

## 2021-11-20 ENCOUNTER — Ambulatory Visit: Payer: PPO

## 2021-11-20 ENCOUNTER — Ambulatory Visit
Admission: EM | Admit: 2021-11-20 | Discharge: 2021-11-20 | Disposition: A | Discharge status: Home / Self Care | Payer: PPO

## 2021-11-20 DIAGNOSIS — R410 Disorientation, unspecified: Secondary | ICD-10-CM | POA: Diagnosis not present

## 2021-11-20 DIAGNOSIS — R509 Fever, unspecified: Secondary | ICD-10-CM | POA: Diagnosis present

## 2021-11-20 DIAGNOSIS — R9431 Abnormal electrocardiogram [ECG] [EKG]: Secondary | ICD-10-CM | POA: Diagnosis not present

## 2021-11-20 DIAGNOSIS — H538 Other visual disturbances: Secondary | ICD-10-CM | POA: Diagnosis not present

## 2021-11-20 DIAGNOSIS — R531 Weakness: Secondary | ICD-10-CM | POA: Diagnosis not present

## 2021-11-20 DIAGNOSIS — Z20822 Contact with and (suspected) exposure to covid-19: Secondary | ICD-10-CM | POA: Insufficient documentation

## 2021-11-20 DIAGNOSIS — R42 Dizziness and giddiness: Secondary | ICD-10-CM | POA: Diagnosis not present

## 2021-11-20 DIAGNOSIS — N3000 Acute cystitis without hematuria: Secondary | ICD-10-CM | POA: Diagnosis not present

## 2021-11-20 DIAGNOSIS — R4182 Altered mental status, unspecified: Secondary | ICD-10-CM | POA: Insufficient documentation

## 2021-11-20 LAB — CBC WITH DIFFERENTIAL/PLATELET
Abs Immature Granulocytes: 0.01 10*3/uL (ref 0.00–0.07)
Basophils Absolute: 0 10*3/uL (ref 0.0–0.1)
Basophils Relative: 0 %
Eosinophils Absolute: 0 10*3/uL (ref 0.0–0.5)
Eosinophils Relative: 0 %
HCT: 33.8 % — ABNORMAL LOW (ref 36.0–46.0)
Hemoglobin: 11.7 g/dL — ABNORMAL LOW (ref 12.0–15.0)
Immature Granulocytes: 0 %
Lymphocytes Relative: 15 %
Lymphs Abs: 0.7 10*3/uL (ref 0.7–4.0)
MCH: 31.9 pg (ref 26.0–34.0)
MCHC: 34.6 g/dL (ref 30.0–36.0)
MCV: 92.1 fL (ref 80.0–100.0)
Monocytes Absolute: 0.6 10*3/uL (ref 0.1–1.0)
Monocytes Relative: 14 %
Neutro Abs: 3.2 10*3/uL (ref 1.7–7.7)
Neutrophils Relative %: 71 %
Platelets: 143 10*3/uL — ABNORMAL LOW (ref 150–400)
RBC: 3.67 MIL/uL — ABNORMAL LOW (ref 3.87–5.11)
RDW: 13.3 % (ref 11.5–15.5)
WBC: 4.5 10*3/uL (ref 4.0–10.5)
nRBC: 0 % (ref 0.0–0.2)

## 2021-11-20 LAB — COMPREHENSIVE METABOLIC PANEL
ALT: 19 U/L (ref 0–44)
AST: 24 U/L (ref 15–41)
Albumin: 3.6 g/dL (ref 3.5–5.0)
Alkaline Phosphatase: 39 U/L (ref 38–126)
Anion gap: 7 (ref 5–15)
BUN: 17 mg/dL (ref 8–23)
CO2: 24 mmol/L (ref 22–32)
Calcium: 8.7 mg/dL — ABNORMAL LOW (ref 8.9–10.3)
Chloride: 105 mmol/L (ref 98–111)
Creatinine, Ser: 0.85 mg/dL (ref 0.44–1.00)
GFR, Estimated: >60 mL/min (ref >60)
Glucose, Bld: 115 mg/dL — ABNORMAL HIGH (ref 70–99)
Potassium: 3.7 mmol/L (ref 3.5–5.1)
Sodium: 136 mmol/L (ref 135–145)
Total Bilirubin: 1.2 mg/dL (ref 0.3–1.2)
Total Protein: 6.4 g/dL — ABNORMAL LOW (ref 6.5–8.1)

## 2021-11-20 LAB — URINALYSIS, ROUTINE W REFLEX MICROSCOPIC
Bilirubin Urine: NEGATIVE
Glucose, UA: NEGATIVE mg/dL
Ketones, ur: NEGATIVE mg/dL
Nitrite: NEGATIVE
Protein, ur: 100 mg/dL — AB
Specific Gravity, Urine: 1.014 (ref 1.005–1.030)
WBC, UA: >50 WBC/hpf — ABNORMAL HIGH (ref 0–5)
pH: 5 (ref 5.0–8.0)

## 2021-11-20 LAB — RESP PANEL BY RT-PCR (FLU A&B, COVID) ARPGX2
Influenza A by PCR: NEGATIVE
Influenza B by PCR: NEGATIVE
SARS Coronavirus 2 by RT PCR: NEGATIVE

## 2021-11-20 MED ORDER — CEPHALEXIN 500 MG PO CAPS: 500.0000 mg | ORAL_CAPSULE | Freq: Four times a day (QID) | ORAL | 0 refills | Status: DC

## 2021-11-20 MED ORDER — SODIUM CHLORIDE 0.9 % IV SOLN
2.0000 g | Freq: Once | INTRAVENOUS | Status: AC
  Administered 2021-11-20: 2 g via INTRAVENOUS
  Filled 2021-11-20: qty 20

## 2021-11-20 MED ORDER — SODIUM CHLORIDE 0.9 % IV BOLUS
1000.0000 mL | Freq: Once | INTRAVENOUS | Status: AC
  Administered 2021-11-20: 1000 mL via INTRAVENOUS

## 2021-11-20 NOTE — ED Provider Notes (Signed)
[facility] EMERGENCY DEPARTMENT Provider Note   CSN: [id number] Arrival date & time: 11/20/21  1035     History  Chief Complaint  Patient presents with   Altered Mental Status    [name] is a 87 y.o. female.  Patient has had a fever and chills for couple days and mild confusion..  Patient has a history of GERD and polymyalgia rheumatica  The history is provided by the patient and medical records. No language interpreter was used.  Altered Mental Status Presenting symptoms: confusion   Severity:  Mild Most recent episode:  Today Episode history:  Single Timing:  Intermittent Progression:  Waxing and waning Chronicity:  New Context: not alcohol use   Associated symptoms: no abdominal pain, no hallucinations, no headaches, no rash and no seizures        Home Medications Prior to Admission medications   Medication Sig Start Date End Date Taking? Authorizing Provider  cephALEXin (KEFLEX) 500 MG capsule Take 1 capsule (500 mg total) by mouth 4 (four) times daily. 11/20/21  Yes Milton Ferguson, MD  Calcium Carb-Cholecalciferol (CALCIUM 600 + D PO) Take 2 tablets by mouth daily.    [provider]  dasatinib (SPRYCEL) 20 MG tablet TAKE 1 TABLET (20 MG TOTAL) BY MOUTH DAILY. 08/29/21 08/29/22  Derek Jack, MD  magnesium 30 MG tablet Take 500 mg by mouth daily.    [provider]  Multiple Vitamin (MULTIVITAMIN WITH MINERALS) TABS tablet Take 1 tablet by mouth daily.    [provider]  polyethylene glycol (MIRALAX / GLYCOLAX) packet Take 17 g by mouth daily as needed.    [provider]  sucralfate (CARAFATE) 1 GM/10ML suspension Take by mouth. 08/04/20   [provider]  tamoxifen (NOLVADEX) 20 MG tablet TAKE 1 TABLET BY MOUTH DAILY. 05/16/21   Derek Jack, MD  Wheat Dextrin (BENEFIBER) POWD Take by mouth 2 (two) times daily. Takes 1 tbsp twice a day    [provider]      Allergies    Fish allergy and  Shrimp [shellfish allergy]    Review of Systems   Review of Systems  Constitutional:  Negative for appetite change and fatigue.  HENT:  Negative for congestion, ear discharge and sinus pressure.   Eyes:  Negative for discharge.  Respiratory:  Negative for cough.   Cardiovascular:  Negative for chest pain.  Gastrointestinal:  Negative for abdominal pain and diarrhea.  Genitourinary:  Negative for frequency and hematuria.  Musculoskeletal:  Negative for back pain.  Skin:  Negative for rash.  Neurological:  Negative for seizures and headaches.  Psychiatric/Behavioral:  Positive for confusion. Negative for hallucinations.     Physical Exam Updated Vital Signs BP (!) 112/45   Pulse 66   Temp 98.1 F (36.7 C) (Oral)   Resp 16   SpO2 95%  Physical Exam Vitals and nursing note reviewed.  Constitutional:      Appearance: She is well-developed.  HENT:     Head: Normocephalic.     Nose: Nose normal.  Eyes:     General: No scleral icterus.    Conjunctiva/sclera: Conjunctivae normal.  Neck:     Thyroid: No thyromegaly.  Cardiovascular:     Rate and Rhythm: Normal rate and regular rhythm.     Heart sounds: No murmur heard.    No friction rub. No gallop.  Pulmonary:     Breath sounds: No stridor. No wheezing or rales.  Chest:     Chest wall:  No tenderness.  Abdominal:     General: There is no distension.     Tenderness: There is no abdominal tenderness. There is no rebound.  Musculoskeletal:        General: Normal range of motion.     Cervical back: Neck supple.  Lymphadenopathy:     Cervical: No cervical adenopathy.  Skin:    Findings: No erythema or rash.  Neurological:     Mental Status: She is alert and oriented to person, place, and time.     Motor: No abnormal muscle tone.     Coordination: Coordination normal.     Comments: Patient without confusion when I saw her  Psychiatric:        Behavior: Behavior normal.     ED Results / Procedures / Treatments    Labs (all labs ordered are listed, but only abnormal results are displayed) Labs Reviewed  CBC WITH DIFFERENTIAL/PLATELET - Abnormal; Notable for the following components:      Result Value   RBC 3.67 (*)    Hemoglobin 11.7 (*)    HCT 33.8 (*)    Platelets 143 (*)    All other components within normal limits  COMPREHENSIVE METABOLIC PANEL - Abnormal; Notable for the following components:   Glucose, Bld 115 (*)    Calcium 8.7 (*)    Total Protein 6.4 (*)    All other components within normal limits  URINALYSIS, ROUTINE W REFLEX MICROSCOPIC - Abnormal; Notable for the following components:   Color, Urine AMBER (*)    APPearance CLOUDY (*)    Hgb urine dipstick MODERATE (*)    Protein, ur 100 (*)    Leukocytes,Ua LARGE (*)    WBC, UA >50 (*)    Bacteria, UA MANY (*)    Non Squamous Epithelial 6-10 (*)    All other components within normal limits  RESP PANEL BY RT-PCR (FLU A&B, COVID) [id number]  URINE CULTURE    EKG None  Radiology CT Head Wo Contrast  Result Date: 11/20/2021 CLINICAL DATA:  Delirium EXAM: CT HEAD WITHOUT CONTRAST TECHNIQUE: Contiguous axial images were obtained from the base of the skull through the vertex without intravenous contrast. RADIATION DOSE REDUCTION: This exam was performed according to the departmental dose-optimization program which includes automated exposure control, adjustment of the mA and/or kV according to patient size and/or use of iterative reconstruction technique. COMPARISON:  None Available. FINDINGS: Brain: No evidence of acute infarction, hemorrhage, cerebral edema, mass, mass effect, or midline shift. No hydrocephalus or extra-axial fluid collection. Cerebral volume is within normal limits for age. Vascular: No hyperdense vessel. Skull: Negative for fracture or focal lesion. Sinuses/Orbits: No acute finding. Other: The mastoid air cells are well aerated. IMPRESSION: No acute intracranial process. Electronically Signed   By: Merilyn Baba M.D.    On: 11/20/2021 11:51   DG Chest Port 1 View  Result Date: 11/20/2021 CLINICAL DATA:  Weakness. EXAM: PORTABLE CHEST 1 VIEW COMPARISON:  None Available. FINDINGS: Heart size and mediastinal contours are unremarkable. Asymmetric elevation of the right hemidiaphragm noted. No signs of pleural effusion or edema. No airspace consolidation. The visualized osseous structures are unremarkable. IMPRESSION: 1. No acute cardiopulmonary abnormalities. 2. Asymmetric elevation of the right hemidiaphragm. Electronically Signed   By: Kerby Moors M.D.   On: 11/20/2021 11:34    Procedures Procedures    Medications Ordered in ED Medications  cefTRIAXone (ROCEPHIN) 2 g in sodium chloride 0.9 % 100 mL IVPB (has no administration in time range)  sodium chloride 0.9 % bolus 1,000 mL (0 mLs Intravenous Stopped 11/20/21 1251)    ED Course/ Medical Decision Making/ A&P                           Medical Decision Making Amount and/or Complexity of Data Reviewed Labs: ordered. Radiology: ordered.  Risk Prescription drug management.  This patient presents to the ED for concern of bowel confusion this involves an extensive number of treatment options, and is a complaint that carries with it a high risk of complications and morbidity.  The differential diagnosis includes UTI, stroke   Co morbidities that complicate the patient evaluation  Polymyalgia rheumatica   Additional history obtained:  Additional history obtained from family External records from outside source obtained and reviewed including hospital record   Lab Tests:  I Ordered, and personally interpreted labs.  The pertinent results include: UA suggest UTI, hemoglobin 11.7 white count normal at 4.5   Imaging Studies ordered:  I ordered imaging studies including CT head and chest x-ray I independently visualized and interpreted imaging which showed negative I agree with the radiologist interpretation   Cardiac Monitoring: /  EKG:  The patient was maintained on a cardiac monitor.  I personally viewed and interpreted the cardiac monitored which showed an underlying rhythm of: Normal sinus rhythm   Consultations Obtained:  No consultant  Problem List / ED Course / Critical interventions / Medication management  Polymyalgia rheumatica I ordered medication including Rocephin for UTI Reevaluation of the patient after these medicines showed that the patient stayed the same I have reviewed the patients home medicines and have made adjustments as needed   Social Determinants of Health:  None   Test / Admission - Considered:  No additional test necessary  Patient has a urinary tract infection.  She is given some Rocephin and will be given a prescription of Keflex and will follow-up with her PCP        Final Clinical Impression(s) / ED Diagnoses Final diagnoses:  Acute cystitis without hematuria    Rx / DC Orders ED Discharge Orders          Ordered    cephALEXin (KEFLEX) 500 MG capsule  4 times daily        11/20/21 1324              Milton Ferguson, MD 11/23/21 1132

## 2021-11-20 NOTE — ED Provider Notes (Signed)
[facility]    CSN: [id number] Arrival date & time: 11/20/21  0947      History   Chief Complaint Chief Complaint  Patient presents with   Diarrhea   Chills        Generalized Body Aches   Altered Mental Status    HPI [name] is a 87 y.o. female.   Patient presents with a few days of chills, body aches, nausea without vomiting, diarrhea, blurred vision, dizziness, and confusion started last night.  Reports she woke up at home, did not remember where she was or where her pajamas were kept.  Also did not remember her appointment time from today.  Female family member in the room reports this is not usually like her.  She has been able to urinate today.  Also having "fullness" in her right low back today.  Reports she got the pneumonia shot at her PCP office last Wednesday or Thursday, wondering if this may be a side effect.  Medical history significant for breast neoplasm, CML, chronic UTI.    Past Medical History:  Diagnosis Date   Bladder infection, chronic    Cancer ([place]) 02/2018   right breast cancer   Complication of anesthesia    GERD (gastroesophageal reflux disease)    Hemorrhoids    Hypercholesterolemia    Polymyalgia rheumatica (HCC)    PONV (postoperative nausea and vomiting)    S/P colonoscopy Jun 14, 2004   friable internal and external hemorrhoids, left-sided diverticula   S/P endoscopy August 13, 2006   pale 1-3 mm nodules consistent with benign squamous papilloma, tiny hiatal hernia   Serrated adenoma of colon     Patient Active Problem List   Diagnosis Date Noted   Pancytopenia, acquired ([place]) 11/16/2020   CML (chronic myelocytic leukemia) ([place]) 03/08/2020   Leukocytosis 02/22/2020   Breast neoplasm, Tis ([facility]), 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   Hemorrhoids 11/22/2016   Hx of adenomatous colonic polyps 07/20/2016   Rectal bleeding 07/20/2016   Constipation 07/20/2016   GERD (gastroesophageal reflux disease) 07/20/2016   Heme + stool 10/30/2010     Past Surgical History:  Procedure Laterality Date   ABDOMINAL HYSTERECTOMY     BREAST LUMPECTOMY WITH RADIOACTIVE SEED LOCALIZATION Right 03/12/2018   Procedure: RIGHT BREAST LUMPECTOMY WITH RADIOACTIVE SEED LOCALIZATION;  Surgeon: Fanny Skates, MD;  Location: [facility];  Service: General;  Laterality: Right;   CHOLECYSTECTOMY     COLONOSCOPY  11/20/2010   Dr. Gala Romney- serrated adenomaL side diverticulosis, hemorrhoids   COLONOSCOPY  06/14/04   friable internal and external hemorrhoids, left-sided diverticula   COLONOSCOPY N/A 08/23/2016   Dr. Gala Romney: Diverticulosis, medium-sized grade 2 internal hemorrhoids.   ESOPHAGOGASTRODUODENOSCOPY  08/13/06   pale 1-3 mm nodules consistent with benign squamous papilloma, tiny hiatal hernia   ESOPHAGOGASTRODUODENOSCOPY N/A 08/23/2016   Dr. Gala Romney: Normal   FLEXIBLE SIGMOIDOSCOPY  11/15/2011   Procedure: FLEXIBLE SIGMOIDOSCOPY;  Surgeon: Daneil Dolin, MD;  [facility];  Service: Endoscopy;  Laterality: N/A;  12:30PM   WRIST FRACTURE SURGERY Right     OB History   No obstetric history on file.      Home Medications    Prior to Admission medications   Medication Sig Start Date End Date Taking? Authorizing Provider  Calcium Carb-Cholecalciferol (CALCIUM 600 + D PO) Take 2 tablets by mouth daily.    [provider]  dasatinib (SPRYCEL) 20 MG tablet TAKE 1 TABLET (20 MG TOTAL) BY MOUTH DAILY. 08/29/21 08/29/22  Derek Jack, MD  magnesium 30 MG tablet Take 500 mg by mouth daily.    [provider]  Multiple Vitamin (MULTIVITAMIN WITH MINERALS) TABS tablet Take 1 tablet by mouth daily.    [provider]  polyethylene glycol (MIRALAX / GLYCOLAX) packet Take 17 g by mouth daily as needed.    [provider]  sucralfate (CARAFATE) 1 GM/10ML suspension Take by mouth. 08/04/20   [provider]  tamoxifen (NOLVADEX) 20 MG tablet TAKE 1 TABLET BY MOUTH DAILY. 05/16/21    Derek Jack, MD  Wheat Dextrin (BENEFIBER) POWD Take by mouth 2 (two) times daily. Takes 1 tbsp twice a day    [provider]    Family History Family History  Problem Relation Age of Onset   Thyroid cancer Mother    Parkinson's disease Sister    Thyroid disease Brother    Aneurysm Sister    Thyroid cancer Other    Thyroid cancer Other    Colon cancer Neg Hx     Social History Social History   Tobacco Use   Smoking status: Never   Smokeless tobacco: Never  Vaping Use   Vaping Use: Never used  Substance Use Topics   Alcohol use: Yes    Alcohol/week: 2.0 standard drinks of alcohol    Types: 2 Glasses of wine per week    Comment: Drinks wine 2-3 days per week ( usually a couple of glasses each time)   Drug use: No     Allergies   Fish allergy and Shrimp [shellfish allergy]   Review of Systems Review of Systems Per HPI  Physical Exam Triage Vital Signs ED Triage Vitals [11/20/21 1002]  Enc Vitals Group     BP 123/64     Pulse Rate 80     Resp 18     Temp 98.1 F (36.7 C)     Temp Source Oral     SpO2 97 %     Weight      Height      Head Circumference      Peak Flow      Pain Score 0     Pain Loc      Pain Edu?      Excl. in [place]?    No data found.  Updated Vital Signs BP 123/64 (BP Location: Right Arm)   Pulse 80   Temp 98.1 F (36.7 C) (Oral)   Resp 18   SpO2 97%   Visual Acuity Right Eye Distance:   Left Eye Distance:   Bilateral Distance:    Right Eye Near:   Left Eye Near:    Bilateral Near:     Physical Exam Vitals and nursing note reviewed.  Constitutional:      General: She is not in acute distress.    Appearance: Normal appearance. She is not toxic-appearing.  HENT:     Right Ear: Tympanic membrane, ear canal and external ear normal. There is no impacted cerumen.     Left Ear: Tympanic membrane, ear canal and external ear normal. There is no impacted cerumen.     Nose: Nose normal. No congestion or  rhinorrhea.     Mouth/Throat:     Mouth: Mucous membranes are moist.     Pharynx: Oropharynx is clear. No posterior oropharyngeal erythema.  Eyes:     General: No scleral icterus.       Right eye: No discharge.        Left eye: No  discharge.  Cardiovascular:     Rate and Rhythm: Normal rate and regular rhythm.  Pulmonary:     Effort: Pulmonary effort is normal. No respiratory distress.     Breath sounds: Normal breath sounds. No wheezing, rhonchi or rales.  Skin:    General: Skin is warm and dry.     Capillary Refill: Capillary refill takes less than 2 seconds.     Coloration: Skin is not jaundiced or pale.     Findings: No erythema.  Neurological:     Mental Status: She is alert and oriented to person, place, and time.  Psychiatric:        Behavior: Behavior is cooperative.      UC Treatments / Results  Labs (all labs ordered are listed, but only abnormal results are displayed) Labs Reviewed - No data to display   EKG   Radiology No results found.  Procedures Procedures (including critical care time)  Medications Ordered in UC Medications - No data to display  Initial Impression / Assessment and Plan / UC Course  I have reviewed the triage vital signs and the nursing notes.  Pertinent labs & imaging results that were available during my care of the patient were reviewed by me and considered in my medical decision making (see chart for details).    Patient is a very pleasant, well-appearing 87 year old female presenting for myriad of symptoms including confusion, dizziness, blurred vision, inability to urinate in urgent care today.  Unable to evaluate for UTI since patient is unable to urinate.  Additionally, given other symptoms, I recommended further evaluation management in the emergency room.  Patient and daughter-in-law are in agreement to this plan.  Patient stable to transport via private vehicle at this time. Final Clinical Impressions(s) / UC Diagnoses    Final diagnoses:  Confusion  Dizziness  Blurred vision     Discharge Instructions      - Please go directly to the Emergency Room for further evaluation of your symptoms     ED Prescriptions   None    PDMP not reviewed this encounter.   Eulogio Bear, NP 11/20/21 1031

## 2021-11-20 NOTE — Discharge Instructions (Signed)
-   Please go directly to the Emergency Room for further evaluation of your symptoms

## 2021-11-20 NOTE — ED Triage Notes (Signed)
Pt presents with daughter in law after being referred to UC. Pt reportedly experiencing 2-3 days of increasing confusion, dizziness, chills, and blurry vision. Pt normally A+Ox4, but mildly confused on time and disoriented to situation during triage. Pt reports that she recently had pneumonia vaccine and thinks symptoms are related to same. Denies any falls or trauma.

## 2021-11-20 NOTE — Discharge Instructions (Signed)
Drink plenty of fluids and follow-up with your doctor either Thursday or Friday for recheck

## 2021-11-20 NOTE — ED Triage Notes (Signed)
Pt reports dizziness "room spinning", blurry vision, chills, diarrhea, body aches, confused, middle back soreness x 2-3 days.. Per family, pt did not remember this morning she had the appointment and the time for the appointment. Pt think can be related to pneumonia shot 1 week ago.

## 2021-11-21 LAB — URINE CULTURE

## 2021-11-27 DIAGNOSIS — Z87440 Personal history of urinary (tract) infections: Secondary | ICD-10-CM | POA: Diagnosis not present

## 2021-11-30 ENCOUNTER — Other Ambulatory Visit (HOSPITAL_COMMUNITY): Payer: Self-pay | Admitting: *Deleted

## 2021-11-30 ENCOUNTER — Other Ambulatory Visit (HOSPITAL_COMMUNITY): Payer: Self-pay | Admitting: Hematology

## 2021-11-30 DIAGNOSIS — D0511 Intraductal carcinoma in situ of right breast: Secondary | ICD-10-CM

## 2021-11-30 NOTE — Telephone Encounter (Signed)
Refill approved for Tamoxifen.  Per last ovn, patient is tolerating and to continue on therapy.

## 2021-12-13 ENCOUNTER — Ambulatory Visit: Payer: PPO | Admitting: Orthopaedic Surgery

## 2021-12-13 ENCOUNTER — Encounter: Payer: Self-pay | Admitting: Orthopaedic Surgery

## 2021-12-13 ENCOUNTER — Other Ambulatory Visit: Payer: Self-pay

## 2021-12-13 ENCOUNTER — Inpatient Hospital Stay: Payer: PPO | Attending: Hematology

## 2021-12-13 ENCOUNTER — Ambulatory Visit (INDEPENDENT_AMBULATORY_CARE_PROVIDER_SITE_OTHER): Payer: PPO

## 2021-12-13 VITALS — BP 144/75 | HR 66 | Ht 68.0 in | Wt 147.8 lb

## 2021-12-13 DIAGNOSIS — Z808 Family history of malignant neoplasm of other organs or systems: Secondary | ICD-10-CM | POA: Diagnosis not present

## 2021-12-13 DIAGNOSIS — M25561 Pain in right knee: Secondary | ICD-10-CM | POA: Diagnosis not present

## 2021-12-13 DIAGNOSIS — Z90710 Acquired absence of both cervix and uterus: Secondary | ICD-10-CM | POA: Diagnosis not present

## 2021-12-13 DIAGNOSIS — M810 Age-related osteoporosis without current pathological fracture: Secondary | ICD-10-CM | POA: Diagnosis not present

## 2021-12-13 DIAGNOSIS — C9210 Chronic myeloid leukemia, BCR/ABL-positive, not having achieved remission: Secondary | ICD-10-CM | POA: Insufficient documentation

## 2021-12-13 DIAGNOSIS — D0511 Intraductal carcinoma in situ of right breast: Secondary | ICD-10-CM | POA: Insufficient documentation

## 2021-12-13 LAB — COMPREHENSIVE METABOLIC PANEL
ALT: 20 U/L (ref 0–44)
AST: 25 U/L (ref 15–41)
Albumin: 4 g/dL (ref 3.5–5.0)
Alkaline Phosphatase: 30 U/L — ABNORMAL LOW (ref 38–126)
Anion gap: 6 (ref 5–15)
BUN: 18 mg/dL (ref 8–23)
CO2: 25 mmol/L (ref 22–32)
Calcium: 8.6 mg/dL — ABNORMAL LOW (ref 8.9–10.3)
Chloride: 106 mmol/L (ref 98–111)
Creatinine, Ser: 0.79 mg/dL (ref 0.44–1.00)
GFR, Estimated: >60 mL/min (ref >60)
Glucose, Bld: 121 mg/dL — ABNORMAL HIGH (ref 70–99)
Potassium: 4.2 mmol/L (ref 3.5–5.1)
Sodium: 137 mmol/L (ref 135–145)
Total Bilirubin: 0.9 mg/dL (ref 0.3–1.2)
Total Protein: 6.7 g/dL (ref 6.5–8.1)

## 2021-12-13 LAB — CBC WITH DIFFERENTIAL/PLATELET
Abs Immature Granulocytes: 0.01 10*3/uL (ref 0.00–0.07)
Basophils Absolute: 0 10*3/uL (ref 0.0–0.1)
Basophils Relative: 1 %
Eosinophils Absolute: 0.1 10*3/uL (ref 0.0–0.5)
Eosinophils Relative: 4 %
HCT: 35.8 % — ABNORMAL LOW (ref 36.0–46.0)
Hemoglobin: 11.7 g/dL — ABNORMAL LOW (ref 12.0–15.0)
Immature Granulocytes: 0 %
Lymphocytes Relative: 36 %
Lymphs Abs: 1 10*3/uL (ref 0.7–4.0)
MCH: 31.6 pg (ref 26.0–34.0)
MCHC: 32.7 g/dL (ref 30.0–36.0)
MCV: 96.8 fL (ref 80.0–100.0)
Monocytes Absolute: 0.2 10*3/uL (ref 0.1–1.0)
Monocytes Relative: 8 %
Neutro Abs: 1.4 10*3/uL — ABNORMAL LOW (ref 1.7–7.7)
Neutrophils Relative %: 51 %
Platelets: 154 10*3/uL (ref 150–400)
RBC: 3.7 MIL/uL — ABNORMAL LOW (ref 3.87–5.11)
RDW: 14 % (ref 11.5–15.5)
WBC: 2.7 10*3/uL — ABNORMAL LOW (ref 4.0–10.5)
nRBC: 0 % (ref 0.0–0.2)

## 2021-12-13 NOTE — Patient Instructions (Signed)
While we are working on your approval please go ahead and call to schedule your appointment with [facility] in at least 2 weeks.    Central Scheduling [phone]    

## 2021-12-13 NOTE — Progress Notes (Signed)
Subjective:    Patient ID: [name], female    DOB: [birth year], 87 y.o.   MRN: [id number]  HPI She had pain in the right knee when she got up two days ago.  She could not stand and had difficulty in extending the knee.  It had some swelling but no redness.  She hopped around the house and tried ice and ibuprofen.  The knee slowly got better.  She has giving way of the knee now and medial pain.  Swelling is present.  She denies any trauma or untoward injury.  She has not had problems before.    Review of Systems  Constitutional:  Positive for activity change.  Musculoskeletal:  Positive for arthralgias, gait problem and joint swelling.  All other systems reviewed and are negative. For Review of Systems, all other systems reviewed and are negative.  The following is a summary of the past history medically, past history surgically, known current medicines, social history and family history.  This information is gathered electronically by the computer from prior information and documentation.  I review this each visit and have found including this information at this point in the chart is beneficial and informative.   Past Medical History:  Diagnosis Date   Bladder infection, chronic    Cancer ([place]) 02/2018   right breast cancer   Complication of anesthesia    GERD (gastroesophageal reflux disease)    Hemorrhoids    Hypercholesterolemia    Polymyalgia rheumatica (HCC)    PONV (postoperative nausea and vomiting)    S/P colonoscopy Jun 14, 2004   friable internal and external hemorrhoids, left-sided diverticula   S/P endoscopy August 13, 2006   pale 1-3 mm nodules consistent with benign squamous papilloma, tiny hiatal hernia   Serrated adenoma of colon     Past Surgical History:  Procedure Laterality Date   ABDOMINAL HYSTERECTOMY     BREAST LUMPECTOMY WITH RADIOACTIVE SEED LOCALIZATION Right 03/12/2018   Procedure: RIGHT BREAST LUMPECTOMY WITH RADIOACTIVE SEED LOCALIZATION;   Surgeon: Fanny Skates, MD;  Location: [facility];  Service: General;  Laterality: Right;   CHOLECYSTECTOMY     COLONOSCOPY  11/20/2010   Dr. Gala Romney- serrated adenomaL side diverticulosis, hemorrhoids   COLONOSCOPY  06/14/04   friable internal and external hemorrhoids, left-sided diverticula   COLONOSCOPY N/A 08/23/2016   Dr. Gala Romney: Diverticulosis, medium-sized grade 2 internal hemorrhoids.   ESOPHAGOGASTRODUODENOSCOPY  08/13/06   pale 1-3 mm nodules consistent with benign squamous papilloma, tiny hiatal hernia   ESOPHAGOGASTRODUODENOSCOPY N/A 08/23/2016   Dr. Gala Romney: Normal   FLEXIBLE SIGMOIDOSCOPY  11/15/2011   Procedure: FLEXIBLE SIGMOIDOSCOPY;  Surgeon: Daneil Dolin, MD;  [facility];  Service: Endoscopy;  Laterality: N/A;  12:30PM   WRIST FRACTURE SURGERY Right     Current Outpatient Medications on File Prior to Visit  Medication Sig Dispense Refill   Calcium Carb-Cholecalciferol (CALCIUM 600 + D PO) Take 2 tablets by mouth daily.     dasatinib (SPRYCEL) 20 MG tablet TAKE 1 TABLET (20 MG TOTAL) BY MOUTH DAILY. 30 tablet 3   magnesium 30 MG tablet Take 500 mg by mouth daily.     Multiple Vitamin (MULTIVITAMIN WITH MINERALS) TABS tablet Take 1 tablet by mouth daily.     polyethylene glycol (MIRALAX / GLYCOLAX) packet Take 17 g by mouth daily as needed.     sucralfate (CARAFATE) 1 GM/10ML suspension Take by mouth.     tamoxifen (NOLVADEX) 20 MG tablet TAKE 1  TABLET BY MOUTH DAILY. 90 tablet 0   Wheat Dextrin (BENEFIBER) POWD Take by mouth 2 (two) times daily. Takes 1 tbsp twice a day     cephALEXin (KEFLEX) 500 MG capsule Take 1 capsule (500 mg total) by mouth 4 (four) times daily. (Patient not taking: Reported on 12/13/2021) 28 capsule 0   No current facility-administered medications on file prior to visit.    Social History   Socioeconomic History   Marital status: Widowed    Spouse name: Not on file   Number of children: 2   Years of education: Not on  file   Highest education level: Not on file  Occupational History   Not on file  Tobacco Use   Smoking status: Never   Smokeless tobacco: Never  Vaping Use   Vaping Use: Never used  Substance and Sexual Activity   Alcohol use: Yes    Alcohol/week: 2.0 standard drinks of alcohol    Types: 2 Glasses of wine per week    Comment: Drinks wine 2-3 days per week ( usually a couple of glasses each time)   Drug use: No   Sexual activity: Not on file  Other Topics Concern   Not on file  Social History Narrative   Not on file   Social Determinants of Health   Financial Resource Strain: Low Risk  (03/08/2020)   Overall Financial Resource Strain (CARDIA)    Difficulty of Paying Living Expenses: Not hard at all  Food Insecurity: No Food Insecurity (03/08/2020)   Hunger Vital Sign    Worried About Running Out of Food in the Last Year: Never true    Ran Out of Food in the Last Year: Never true  Transportation Needs: No Transportation Needs (03/08/2020)   PRAPARE - Hydrologist (Medical): No    Lack of Transportation (Non-Medical): No  Physical Activity: Insufficiently Active (03/08/2020)   Exercise Vital Sign    Days of Exercise per Week: 7 days    Minutes of Exercise per Session: 20 min  Stress: No Stress Concern Present (03/08/2020)   [place]    Feeling of Stress : Not at all  Social Connections: Moderately Isolated (03/08/2020)   Social Connection and Isolation Panel [NHANES]    Frequency of Communication with Friends and Family: Twice a week    Frequency of Social Gatherings with Friends and Family: Twice a week    Attends Religious Services: More than 4 times per year    Active Member of [employer] or Organizations: No    Attends Archivist Meetings: Never    Marital Status: Widowed  Intimate Partner Violence: Not At Risk (03/08/2020)   Humiliation, Afraid, Rape, and Kick  questionnaire    Fear of Current or Ex-Partner: No    Emotionally Abused: No    Physically Abused: No    Sexually Abused: No    Family History  Problem Relation Age of Onset   Thyroid cancer Mother    Parkinson's disease Sister    Thyroid disease Brother    Aneurysm Sister    Thyroid cancer Other    Thyroid cancer Other    Colon cancer Neg Hx     BP (!) 144/75   Pulse 66   Ht '5\' 8"'$  (1.727 m)   Wt 147 lb 12.8 oz (67 kg)   BMI 22.47 kg/m   Body mass index is 22.47 kg/m.      Objective:  Physical Exam Vitals and nursing note reviewed. Exam conducted with a chaperone present.  Constitutional:      Appearance: She is well-developed.  HENT:     Head: Normocephalic and atraumatic.  Eyes:     Conjunctiva/sclera: Conjunctivae normal.     Pupils: Pupils are equal, round, and reactive to light.  Cardiovascular:     Rate and Rhythm: Normal rate and regular rhythm.  Pulmonary:     Effort: Pulmonary effort is normal.  Abdominal:     Palpations: Abdomen is soft.  Musculoskeletal:     Cervical back: Normal range of motion and neck supple.       Legs:  Skin:    General: Skin is warm and dry.  Neurological:     Mental Status: She is alert and oriented to person, place, and time.     Cranial Nerves: No cranial nerve deficit.     Motor: No abnormal muscle tone.     Coordination: Coordination normal.     Deep Tendon Reflexes: Reflexes are normal and symmetric. Reflexes normal.  Psychiatric:        Behavior: Behavior normal.        Thought Content: Thought content normal.        Judgment: Judgment normal.   X-rays were done of the right knee, reported separately.        Assessment & Plan:   Encounter Diagnosis  Name Primary?   Acute pain of right knee Yes   I am concerned about medial meniscus tear.  I will set up MRI.  She will be out of town next week visiting her brother in [place].  She can do MRI after she returns.  PROCEDURE NOTE:  The patient  requests injections of the right knee , verbal consent was obtained.  The right knee was prepped appropriately after time out was performed.   Sterile technique was observed and injection of 1 cc of DepoMedrol '40mg'$  with several cc's of plain xylocaine. Anesthesia was provided by ethyl chloride and a 20-gauge needle was used to inject the knee area. The injection was tolerated well.  A band aid dressing was applied.  The patient was advised to apply ice later today and tomorrow to the injection sight as needed.  I will see her in three weeks.  Call if any problem.  Precautions discussed.  Electronically Signed Sanjuana Kava, MD 8/9/20238:53 AM

## 2021-12-14 ENCOUNTER — Encounter (HOSPITAL_COMMUNITY): Admission: RE | Admit: 2021-12-14 | Payer: PPO | Source: Ambulatory Visit

## 2021-12-19 ENCOUNTER — Other Ambulatory Visit: Payer: Self-pay | Admitting: *Deleted

## 2021-12-19 DIAGNOSIS — C9210 Chronic myeloid leukemia, BCR/ABL-positive, not having achieved remission: Secondary | ICD-10-CM

## 2021-12-19 MED ORDER — DASATINIB 20 MG PO TABS: ORAL_TABLET | Freq: Every day | ORAL | 3 refills | Status: DC

## 2021-12-19 NOTE — Telephone Encounter (Signed)
Sprycel refill approved.  Patient tolerating and is to continue therapy.

## 2021-12-20 LAB — BCR-ABL1, CML/ALL, PCR, QUANT: Interpretation (BCRAL):: NEGATIVE

## 2021-12-27 ENCOUNTER — Inpatient Hospital Stay: Payer: PPO | Admitting: Hematology

## 2021-12-27 VITALS — BP 141/55 | HR 74 | Temp 98.0°F | Resp 17 | Ht 68.0 in | Wt 149.8 lb

## 2021-12-27 DIAGNOSIS — D0511 Intraductal carcinoma in situ of right breast: Secondary | ICD-10-CM | POA: Diagnosis not present

## 2021-12-27 DIAGNOSIS — C9210 Chronic myeloid leukemia, BCR/ABL-positive, not having achieved remission: Secondary | ICD-10-CM | POA: Diagnosis not present

## 2021-12-27 MED ORDER — TAMOXIFEN CITRATE 20 MG PO TABS: 20.0000 mg | ORAL_TABLET | Freq: Every day | ORAL | 6 refills | Status: DC

## 2021-12-27 NOTE — Patient Instructions (Signed)
[place] at [facility] Discharge Instructions  You were seen and examined today by Dr. Delton Coombes.  Dr. Delton Coombes discussed your most recent lab work and everything looks good.   Follow-up as scheduled in 4 months.    Thank you for choosing [place] at [facility] to provide your oncology and hematology care.  To afford each patient quality time with our provider, please arrive at least 15 minutes before your scheduled appointment time.   If you have a lab appointment with the [facility] please come in thru the Main Entrance and check in at the main information desk.  You need to re-schedule your appointment should you arrive 10 or more minutes late.  We strive to give you quality time with our providers, and arriving late affects you and other patients whose appointments are after yours.  Also, if you no show three or more times for appointments you may be dismissed from the clinic at the providers discretion.     Again, thank you for choosing [facility].  Our hope is that these requests will decrease the amount of time that you wait before being seen by our physicians.       _____________________________________________________________  Should you have questions after your visit to [facility], please contact our office at [phone] and follow the prompts.  Our office hours are 8:00 a.m. and 4:30 p.m. Monday - Friday.  Please note that voicemails left after 4:00 p.m. may not be returned until the following business day.  We are closed weekends and major holidays.  You do have access to a nurse 24-7, just call the main number to the clinic [phone] and do not press any options, hold on the line and a nurse will answer the phone.    For prescription refill requests, have your pharmacy contact our office and allow 72 hours.

## 2021-12-27 NOTE — Progress Notes (Signed)
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 female, here for follow-up of CML and right breast DCIS.  She is tolerating Dasatinib Monday through Friday very well.  Denies any GI side effects.  Denies any signs or symptoms of PND or orthopnea.  Denies any hot flashes from tamoxifen.  No recent infections or hospitalizations.  REVIEW OF SYSTEMS:  Review of Systems  Constitutional:  Negative for appetite change and fatigue.  HENT:   Positive for trouble swallowing.   Respiratory:  Negative for shortness of breath.   Cardiovascular:  Negative for chest pain.  Gastrointestinal:  Positive for constipation. Negative for diarrhea, nausea and vomiting.  Genitourinary:  Positive for frequency.   Neurological:  Positive for numbness (tingling R arm). Negative for dizziness.  Psychiatric/Behavioral:  Positive for sleep disturbance.   All other systems reviewed and are negative.   PAST MEDICAL/SURGICAL HISTORY:  Past Medical History:  Diagnosis Date   Bladder infection, chronic    Cancer ([place]) 02/2018   right breast cancer   Complication of anesthesia    GERD (gastroesophageal reflux disease)    Hemorrhoids    Hypercholesterolemia    Polymyalgia rheumatica (HCC)    PONV (postoperative nausea and vomiting)    S/P colonoscopy Jun 14, 2004   friable internal and external hemorrhoids, left-sided diverticula   S/P endoscopy August 13, 2006   pale 1-3 mm nodules consistent with benign squamous papilloma, tiny hiatal hernia   Serrated adenoma of colon    Past Surgical History:  Procedure Laterality Date   ABDOMINAL HYSTERECTOMY      BREAST LUMPECTOMY WITH RADIOACTIVE SEED LOCALIZATION Right 03/12/2018   Procedure: RIGHT BREAST LUMPECTOMY WITH RADIOACTIVE SEED LOCALIZATION;  Surgeon: Fanny Skates, MD;  Location: [facility];  Service: General;  Laterality: Right;   CHOLECYSTECTOMY     COLONOSCOPY  11/20/2010   Dr. Gala Romney- serrated adenomaL side diverticulosis, hemorrhoids   COLONOSCOPY  06/14/04   friable internal and external hemorrhoids, left-sided diverticula   COLONOSCOPY N/A 08/23/2016   Dr. Gala Romney: Diverticulosis, medium-sized grade 2 internal hemorrhoids.   ESOPHAGOGASTRODUODENOSCOPY  08/13/06   pale 1-3 mm nodules consistent with benign squamous papilloma, tiny hiatal hernia   ESOPHAGOGASTRODUODENOSCOPY N/A 08/23/2016   Dr. Gala Romney: Normal   FLEXIBLE SIGMOIDOSCOPY  11/15/2011   Procedure: FLEXIBLE SIGMOIDOSCOPY;  Surgeon: Daneil Dolin, MD;  [facility];  Service: Endoscopy;  Laterality: N/A;  12:30PM   WRIST FRACTURE SURGERY Right     SOCIAL HISTORY:  Social History   Socioeconomic History   Marital status: Widowed    Spouse name: Not on file   Number of children: 2   Years of education: Not on file   Highest education level: Not on file  Occupational History   Not on file  Tobacco Use   Smoking status: Never   Smokeless tobacco: Never  Vaping Use   Vaping Use: Never used  Substance and Sexual Activity   Alcohol use: Yes    Alcohol/week: 2.0 standard drinks of alcohol    Types: 2 Glasses of wine per week  Comment: Drinks wine 2-3 days per week ( usually a couple of glasses each time)   Drug use: No   Sexual activity: Not on file  Other Topics Concern   Not on file  Social History Narrative   Not on file   Social Determinants of Health   Financial Resource Strain: Low Risk  (03/08/2020)   Overall Financial Resource Strain (CARDIA)    Difficulty of Paying Living Expenses: Not hard at all  Food Insecurity: No Food Insecurity (03/08/2020)   Hunger Vital Sign     Worried About Running Out of Food in the Last Year: Never true    Ran Out of Food in the Last Year: Never true  Transportation Needs: No Transportation Needs (03/08/2020)   PRAPARE - Hydrologist (Medical): No    Lack of Transportation (Non-Medical): No  Physical Activity: Insufficiently Active (03/08/2020)   Exercise Vital Sign    Days of Exercise per Week: 7 days    Minutes of Exercise per Session: 20 min  Stress: No Stress Concern Present (03/08/2020)   [place]    Feeling of Stress : Not at all  Social Connections: Moderately Isolated (03/08/2020)   Social Connection and Isolation Panel [NHANES]    Frequency of Communication with Friends and Family: Twice a week    Frequency of Social Gatherings with Friends and Family: Twice a week    Attends Religious Services: More than 4 times per year    Active Member of [employer] or Organizations: No    Attends Archivist Meetings: Never    Marital Status: Widowed  Intimate Partner Violence: Not At Risk (03/08/2020)   Humiliation, Afraid, Rape, and Kick questionnaire    Fear of Current or Ex-Partner: No    Emotionally Abused: No    Physically Abused: No    Sexually Abused: No    FAMILY HISTORY:  Family History  Problem Relation Age of Onset   Thyroid cancer Mother    Parkinson's disease Sister    Thyroid disease Brother    Aneurysm Sister    Thyroid cancer Other    Thyroid cancer Other    Colon cancer Neg Hx     CURRENT MEDICATIONS:  Current Outpatient Medications  Medication Sig Dispense Refill   Calcium Carb-Cholecalciferol (CALCIUM 600 + D PO) Take 2 tablets by mouth daily.     cephALEXin (KEFLEX) 500 MG capsule Take 1 capsule (500 mg total) by mouth 4 (four) times daily. 28 capsule 0   dasatinib (SPRYCEL) 20 MG tablet TAKE 1 TABLET (20 MG TOTAL) BY MOUTH DAILY. 30 tablet 3   magnesium 30 MG tablet Take 500 mg by mouth daily.      Multiple Vitamin (MULTIVITAMIN WITH MINERALS) TABS tablet Take 1 tablet by mouth daily.     polyethylene glycol (MIRALAX / GLYCOLAX) packet Take 17 g by mouth daily as needed.     sucralfate (CARAFATE) 1 GM/10ML suspension Take by mouth.     tamoxifen (NOLVADEX) 20 MG tablet TAKE 1 TABLET BY MOUTH DAILY. 90 tablet 0   Wheat Dextrin (BENEFIBER) POWD Take by mouth 2 (two) times daily. Takes 1 tbsp twice a day     No current facility-administered medications for this visit.    ALLERGIES:  Allergies  Allergen Reactions   Fish Allergy Nausea And Vomiting   Shrimp [Shellfish Allergy] Nausea And Vomiting    PHYSICAL EXAM:  Performance status (ECOG): 1 - Symptomatic  but completely ambulatory  Vitals:   12/27/21 1425  BP: (!) 141/55  Pulse: 74  Resp: 17  Temp: 98 F (36.7 C)  SpO2: 99%   Wt Readings from Last 3 Encounters:  12/27/21 149 lb 12.8 oz (67.9 kg)  12/13/21 147 lb 12.8 oz (67 kg)  09/19/21 153 lb 8 oz (69.6 kg)   Physical Exam Vitals reviewed.  Constitutional:      Appearance: Normal appearance.  Cardiovascular:     Rate and Rhythm: Normal rate and regular rhythm.     Pulses: Normal pulses.     Heart sounds: Normal heart sounds.  Pulmonary:     Effort: Pulmonary effort is normal.     Breath sounds: Normal breath sounds.  Abdominal:     Palpations: Abdomen is soft. There is no hepatomegaly, splenomegaly or mass.     Tenderness: There is no abdominal tenderness.  Musculoskeletal:     Right lower leg: No edema.     Left lower leg: No edema.  Neurological:     General: No focal deficit present.     Mental Status: She is alert and oriented to person, place, and time.  Psychiatric:        Mood and Affect: Mood normal.        Behavior: Behavior normal.      LABORATORY DATA:  I have reviewed the labs as listed.     Latest Ref Rng & Units 12/13/2021   10:13 AM 11/20/2021   11:11 AM 09/12/2021    8:15 AM  CBC  WBC 4.0 - 10.5 K/uL 2.7  4.5  2.9   Hemoglobin  12.0 - 15.0 g/dL 11.7  11.7  12.2   Hematocrit 36.0 - 46.0 % 35.8  33.8  37.7   Platelets 150 - 400 K/uL 154  143  144       Latest Ref Rng & Units 12/13/2021   10:13 AM 11/20/2021   11:11 AM 09/12/2021    8:15 AM  CMP  Glucose 70 - 99 mg/dL 121  115  105   BUN 8 - 23 mg/dL _0 Creatinine 0.44 - 1.00 mg/dL 0.79  0.85  0.77   Sodium 135 - 145 mmol/L 137  136  139   Potassium 3.5 - 5.1 mmol/L 4.2  3.7  3.9   Chloride 98 - 111 mmol/L 106  105  109   CO2 22 - 32 mmol/L _1 Calcium 8.9 - 10.3 mg/dL 8.6  8.7  8.4   Total Protein 6.5 - 8.1 g/dL 6.7  6.4  6.6   Total Bilirubin 0.3 - 1.2 mg/dL 0.9  1.2  1.1   Alkaline Phos 38 - 126 U/L 30  39  32   AST 15 - 41 U/L _2 ALT 0 - 44 U/L _3 DIAGNOSTIC IMAGING:  I have independently reviewed the scans and discussed with the patient. DG Knee 3 Views Right  Result Date: 12/13/2021 Clinical:  right knee pain, no trauma X-rays were done of the right knee, three views. There is mild degenerative changes of the right knee and superior area of patella also.  No fracture or loose body noted.  Bone quality is good. Impression:  mild degenerative changes of the right knee, no acute findings. Electronically Signed Sanjuana Kava, MD 8/9/20238:43 AM    ASSESSMENT:  1.  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  BCR/ABL by FISH on 02/23/2020 was positive for fusion protein. 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 -Chromosome analysis confirms Philadelphia chromosome. -BCR/ABL by quantitative PCR with B2 A2 transcript 132% -Dasatinib 50 mg daily started on 03/13/2020. - Sprycel dose reduced to 20 mg daily on 05/19/2020 secondary to leukopenia/neutropenia. - Sprycel dose changed to 20 mg daily Monday through Friday due to tiredness and dizziness on 06/05/2021.   2.  Right breast DCIS: - Screening mammogram on 02/03/2018 was abnormal. -Ultrasound of the right breast on  02/11/2018 shows mass measuring 0.5 x 0.4 x 0.5 cm, circumscribed hypoechoic in the 3:30 o'clock location of the right breast.  Right axilla ultrasound was negative for adenopathy. - Right breast core biopsy on 02/18/2018 shows ductal carcinoma with papillary configuration.  In the comment section, it notes that this may represent DCIS involving a papillary lesion or it may represent an intracystic papillary ductal carcinoma.  ER/PR was 95% positive. - Right breast lumpectomy on 03/12/2018, pathology showing intermediate grade DCIS, 0.6 cm, with areas showing papillary configuration.  Posterior margin was 0.2 cm.  No evidence of invasion. - Radiation therapy was refused. -Tamoxifen for 5 years started on 04/09/2018.    3.  Osteoporosis: -DEXA scan on 04/11/2018 with T score of -2.9. -DEXA scan on 05/26/2020 with T score -2.3.   PLAN:  1.  CML in chronic phase: - She is taking Dasatinib 20 mg Monday through Friday. - Reviewed labs from 12/13/2021 which showed normal LFTs and creatinine.  CBC shows leukopenia with ANC of 1.4.  BCR/ABL by quantitative PCR is negative.  She was asking about discontinuation of TKI.  I have recommended that she continue Dasatinib at this time. - Continue Dasatinib 20 mg daily Monday through Friday.  RTC 4 months for follow-up with repeat BCR/ABL by quantitative PCR.   2.  Right breast DCIS: - Last mammogram on 02/21/2021 was normal. - Continue tamoxifen until end of 2024.   3.  Osteoporosis: - Continue calcium supplements twice daily. - She has not been receiving Prolia since 05/11/2021.  She had 2 doses of it.   Orders placed this encounter:  No orders of the defined types were placed in this encounter.    Derek Jack, MD [place] [phone]

## 2021-12-28 ENCOUNTER — Ambulatory Visit (HOSPITAL_COMMUNITY)
Admission: RE | Admit: 2021-12-28 | Discharge: 2021-12-28 | Disposition: A | Discharge status: Home / Self Care | Payer: PPO | Source: Ambulatory Visit | Attending: Orthopaedic Surgery | Admitting: Orthopaedic Surgery

## 2021-12-28 DIAGNOSIS — M25561 Pain in right knee: Secondary | ICD-10-CM | POA: Insufficient documentation

## 2022-01-03 ENCOUNTER — Ambulatory Visit: Payer: PPO | Admitting: Orthopaedic Surgery

## 2022-01-03 ENCOUNTER — Encounter: Payer: Self-pay | Admitting: Orthopaedic Surgery

## 2022-01-03 DIAGNOSIS — S83281A Other tear of lateral meniscus, current injury, right knee, initial encounter: Secondary | ICD-10-CM

## 2022-01-03 DIAGNOSIS — M25561 Pain in right knee: Secondary | ICD-10-CM

## 2022-01-03 NOTE — Patient Instructions (Signed)
Please schedule patient to see Dr.Harrison RT knee meniscal tear

## 2022-01-03 NOTE — Progress Notes (Signed)
My knee still hurts, gives way  She had MRI of the right knee showing: IMPRESSION: 1. Anterior horn lateral meniscus tear. 2. Sprain/partial tearing of the fibular collateral ligament. 3. Tricompartmental degenerative changes most significant in the patellofemoral joint. 4. Small joint effusion and moderate synovitis. Small leaking Baker's cyst.  I have explained the findings to her.  I have recommended she see Dr. Aline Brochure for consideration of possible surgery.  Her knee gives way often and she is very concerned about this and potential other injury.  Right knee has crepitus, just slight effusion, ROM 0 to 110, pain laterally, positive lateral [place].  NV intact.  No distal edema.  Encounter Diagnoses  Name Primary?   Acute tear lateral meniscus, right, initial encounter Yes   Acute pain of right knee    To see Dr Aline Brochure.  I have independently reviewed the MRI.    Call if any problem.  Precautions discussed.  Electronically Signed Sanjuana Kava, MD 8/30/20238:29 AM

## 2022-01-04 ENCOUNTER — Other Ambulatory Visit (HOSPITAL_COMMUNITY): Payer: Self-pay

## 2022-01-17 ENCOUNTER — Ambulatory Visit: Payer: PPO | Admitting: Orthopedic Surgery

## 2022-01-17 ENCOUNTER — Encounter: Payer: Self-pay | Admitting: Orthopedic Surgery

## 2022-01-17 VITALS — Ht 68.0 in | Wt 144.0 lb

## 2022-01-17 DIAGNOSIS — M23341 Other meniscus derangements, anterior horn of lateral meniscus, right knee: Secondary | ICD-10-CM

## 2022-01-17 DIAGNOSIS — M1712 Unilateral primary osteoarthritis, left knee: Secondary | ICD-10-CM

## 2022-01-17 DIAGNOSIS — M1711 Unilateral primary osteoarthritis, right knee: Secondary | ICD-10-CM | POA: Diagnosis not present

## 2022-01-17 NOTE — Progress Notes (Signed)
Chief Complaint  Patient presents with   Knee Pain    R/it is hurting and I am here to discuss surgery.   87 year old female referred to me by Dr. Luna Glasgow for possible surgery.  The patient woke up late August with pain in her right knee severe enough to interfere with her activities of daily living  She tells me that she takes care of her yard which includes mowing weed eating and tending flower beds and she is very active  She has a trip scheduled for the end of this month  She had 1 injection in her right knee did not get much improvement went for MRI and it shows she has a anterior horn lateral meniscus tear along with degenerative arthritis of the knee  Her plain films only show mild arthritis  Her complaints are of diffuse knee pain which is now progressed to some right hip pain which radiates down the right leg  Examination of the right lower extremity reveals tenderness in the lateral compartment medial compartment.  Small effusion.  Flexion 125 degrees extension just shy of full extension  Her gait is notable for limp  She is neurovascular intact  She is awake and alert she is oriented x3 mood and affect are normal  Body habitus is ectomorphic  We discussed surgical versus nonsurgical options  She is currently on ibuprofen 2 tablets as needed for pain  She does have frequent feelings that the knee will give way and its unstable to her  Unclear to me and I discussed this with her that a lateral meniscectomy of the anterior horn will cure her pain  She may still has some residual arthritic pain although it is unusual for arthritis to come on so suddenly  At this point we can continue with ibuprofen 400 mg 3 times daily supplement with 500 mg of Tylenol on a as needed basis add an economy hinged brace and see her early October after her trip

## 2022-01-17 NOTE — Patient Instructions (Signed)
Ibuprofen 400 mg 3 x  day   Brace wear for activity   You can supplement this with tylenol 500 mg every 6 hrs

## 2022-02-05 ENCOUNTER — Ambulatory Visit: Payer: PPO | Admitting: Orthopedic Surgery

## 2022-02-12 ENCOUNTER — Ambulatory Visit: Payer: PPO | Admitting: Orthopedic Surgery

## 2022-02-12 DIAGNOSIS — M23341 Other meniscus derangements, anterior horn of lateral meniscus, right knee: Secondary | ICD-10-CM | POA: Diagnosis not present

## 2022-02-12 DIAGNOSIS — M1711 Unilateral primary osteoarthritis, right knee: Secondary | ICD-10-CM | POA: Diagnosis not present

## 2022-02-12 MED ORDER — METHYLPREDNISOLONE ACETATE 40 MG/ML IJ SUSP
40.0000 mg | Freq: Once | INTRAMUSCULAR | Status: AC
  Administered 2022-02-12: 40 mg via INTRA_ARTICULAR

## 2022-02-12 NOTE — Progress Notes (Signed)
Chief Complaint  Patient presents with   Knee Pain    Right-Worse, fell 2 weeks ago was on vacatio with family and was getting down from a higher chair and she just went down, feels like knee gave away   Encounter Diagnoses  Name Primary?   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 Tetro went on her trip she fell again her knee gave way when she got up  I suggested she might need arthroscopic surgery but she wanted to have an injection  Reexamination of the knee she has lateral joint line tenderness no swelling although she says it swells by the end of the day the knee feels stable she still has some pain with rotation of the leg suggesting a positive McMurray  I injected the right knee  I think she would need arthroscopic surgery but she wants to try the injection so we will try that and come back in 2 weeks to rediscuss

## 2022-02-23 ENCOUNTER — Encounter: Payer: Self-pay | Admitting: Orthopedic Surgery

## 2022-02-23 ENCOUNTER — Ambulatory Visit: Payer: PPO | Admitting: Orthopedic Surgery

## 2022-02-23 DIAGNOSIS — M1711 Unilateral primary osteoarthritis, right knee: Secondary | ICD-10-CM | POA: Diagnosis not present

## 2022-02-23 DIAGNOSIS — M23341 Other meniscus derangements, anterior horn of lateral meniscus, right knee: Secondary | ICD-10-CM | POA: Diagnosis not present

## 2022-02-23 DIAGNOSIS — Z01818 Encounter for other preprocedural examination: Secondary | ICD-10-CM

## 2022-02-23 NOTE — Patient Instructions (Signed)
Your surgery will be at [facility] by Dr Harrison  The hospital will contact you with a preoperative appointment to discuss Anesthesia.  Please arrive on time or 15 minutes early for the preoperative appointment, they have a very tight schedule if you are late or do not come in your surgery will be cancelled.  The phone number is [phone]. Please bring your medications with you for the appointment. They will tell you the arrival time and medication instructions when you have your preoperative evaluation. Do not wear nail polish the day of your surgery and if you take Phentermine you need to stop this medication ONE WEEK prior to your surgery. If you take Invokana, Farxiga, Jardiance, or Steglatro) - Hold 72 hours before the procedure.  If you take Ozempic,  Bydureon or Trulicity do not take for 8 days before your surgery. If you take Victoza, Rybelsis, Saxenda or Adlyxi stop 24 hours before the procedure.  Please arrive at the hospital 2 hours before procedure if scheduled at 9:30 or later in the day or at the time the nurse tells you at your preoperative visit.   If you have my chart do not use the time given in my chart use the time given to you by the nurse during your preoperative visit.   Your surgery  time may change. Please be available for phone calls the day of your surgery and the day before. The Short Stay department may need to discuss changes about your surgery time. Not reaching the you could lead to procedure delays and possible cancellation.  You must have a ride home and someone to stay with you for 24 to 48 hours. The person taking you home will receive and sign for the your discharge instructions.  Please be prepared to give your support person's name and telephone number to [facility]. Dr Harrison will need that name and phone number post procedure.   

## 2022-02-23 NOTE — Progress Notes (Signed)
Chief Complaint  Patient presents with   Knee Pain    RT knee//pain is getting worse//last injection only lasted about 3 or 4 days   [name] was seen 9 October she had a lateral meniscal tear she was having lateral pain we suggested surgery but she wanted to try an injection which we did.  It only lasted 3 to 4 days  She says she cannot continue to live with the knee as it is because she is too active  We will schedule surgery on November 7 she has a [birth year].  She is anxious to get back into her yard  She has full extension of the knee some pain with flexion lateral joint line tenderness  We are planning to do arthroscopy right knee lateral meniscectomy  Same-day surgery she will need a walker 3 to 5 days.  She will be weightbearing as tolerated  We discussed DVT and swelling is possible postop complications with 3 to 4-week recovery.

## 2022-03-07 ENCOUNTER — Other Ambulatory Visit: Payer: Self-pay | Admitting: Orthopedic Surgery

## 2022-03-07 DIAGNOSIS — M23341 Other meniscus derangements, anterior horn of lateral meniscus, right knee: Secondary | ICD-10-CM

## 2022-03-09 ENCOUNTER — Encounter (HOSPITAL_COMMUNITY): Payer: Self-pay

## 2022-03-09 ENCOUNTER — Encounter (HOSPITAL_COMMUNITY)
Admission: RE | Admit: 2022-03-09 | Discharge: 2022-03-09 | Disposition: A | Discharge status: Home / Self Care | Payer: PPO | Source: Ambulatory Visit | Attending: Orthopedic Surgery | Admitting: Orthopedic Surgery

## 2022-03-09 DIAGNOSIS — M23341 Other meniscus derangements, anterior horn of lateral meniscus, right knee: Secondary | ICD-10-CM | POA: Diagnosis not present

## 2022-03-09 DIAGNOSIS — Z01818 Encounter for other preprocedural examination: Secondary | ICD-10-CM | POA: Diagnosis not present

## 2022-03-09 LAB — CBC WITH DIFFERENTIAL/PLATELET
Abs Immature Granulocytes: 0.01 10*3/uL (ref 0.00–0.07)
Basophils Absolute: 0 10*3/uL (ref 0.0–0.1)
Basophils Relative: 0 %
Eosinophils Absolute: 0.1 10*3/uL (ref 0.0–0.5)
Eosinophils Relative: 2 %
HCT: 34.6 % — ABNORMAL LOW (ref 36.0–46.0)
Hemoglobin: 11.6 g/dL — ABNORMAL LOW (ref 12.0–15.0)
Immature Granulocytes: 0 %
Lymphocytes Relative: 36 %
Lymphs Abs: 1.4 10*3/uL (ref 0.7–4.0)
MCH: 32.4 pg (ref 26.0–34.0)
MCHC: 33.5 g/dL (ref 30.0–36.0)
MCV: 96.6 fL (ref 80.0–100.0)
Monocytes Absolute: 0.3 10*3/uL (ref 0.1–1.0)
Monocytes Relative: 7 %
Neutro Abs: 2.1 10*3/uL (ref 1.7–7.7)
Neutrophils Relative %: 55 %
Platelets: 169 10*3/uL (ref 150–400)
RBC: 3.58 MIL/uL — ABNORMAL LOW (ref 3.87–5.11)
RDW: 13.7 % (ref 11.5–15.5)
WBC: 3.8 10*3/uL — ABNORMAL LOW (ref 4.0–10.5)
nRBC: 0 % (ref 0.0–0.2)

## 2022-03-09 LAB — BASIC METABOLIC PANEL
Anion gap: 8 (ref 5–15)
BUN: 25 mg/dL — ABNORMAL HIGH (ref 8–23)
CO2: 23 mmol/L (ref 22–32)
Calcium: 8.3 mg/dL — ABNORMAL LOW (ref 8.9–10.3)
Chloride: 105 mmol/L (ref 98–111)
Creatinine, Ser: 1 mg/dL (ref 0.44–1.00)
GFR, Estimated: 55 mL/min — ABNORMAL LOW (ref >60)
Glucose, Bld: 93 mg/dL (ref 70–99)
Potassium: 4 mmol/L (ref 3.5–5.1)
Sodium: 136 mmol/L (ref 135–145)

## 2022-03-09 NOTE — Patient Instructions (Signed)
[name]  03/09/2022     '@PREFPERIOPPHARMACY'$ @   Your procedure is scheduled on November 7.  Report to [facility] at 0830 A.M.  Call this number if you have problems the morning of surgery:  [phone]  If you experience any cold or flu symptoms such as cough, fever, chills, shortness of breath, etc. between now and your scheduled surgery, please notify us at the above number.   Remember:  Do not eat or drink after midnight.      Take these medicines the morning of surgery with A SIP OF WATER none    Do not wear jewelry, make-up or nail polish.  Do not wear lotions, powders, or perfumes, or deodorant.  Do not shave 48 hours prior to surgery.  Men may shave face and neck.  Do not bring valuables to the hospital.  [facility] is not responsible for any belongings or valuables.  Contacts, dentures or bridgework may not be worn into surgery.  Leave your suitcase in the car.  After surgery it may be brought to your room.  For patients admitted to the hospital, discharge time will be determined by your treatment team.  Patients discharged the day of surgery will not be allowed to drive home.   Name and phone number of your driver:   family Special instructions:  none  Please read over the following fact sheets that you were given. Surgical Site Infection Prevention, Anesthesia Post-op Instructions, and Care and Recovery After Surgery    Incentive Spirometer Record  Use your incentive spirometer as told by your health care provider. Your health care provider or respiratory therapist will help you set a goal. Breathe in slowly and as deeply as you can through your mouth. This will cause the piston or the ball to rise toward the top of the chamber. Try to reach the goal marker set on the spirometer. Use this form to write down your progress. Use additional notebook paper, if needed. Bring this form with you to your follow-up visits. My incentive spirometer goal is to reach  ____________________. Date __________ Time __________ Amount reached __________ Date __________ Time __________ Amount reached __________ Date __________ Time __________ Amount reached __________ Date __________ Time __________ Amount reached __________ Date __________ Time __________ Amount reached __________ Date __________ Time __________ Amount reached __________ Date __________ Time __________ Amount reached __________ Date __________ Time __________ Amount reached __________ Date __________ Time __________ Amount reached __________ Date __________ Time __________ Amount reached __________ Date __________ Time __________ Amount reached __________ Date __________ Time __________ Amount reached __________ Date __________ Time __________ Amount reached __________ Date __________ Time __________ Amount reached __________ Date __________ Time __________ Amount reached __________ Date __________ Time __________ Amount reached __________ Date __________ Time __________ Amount reached __________ Date __________ Time __________ Amount reached __________ Date __________ Time __________ Amount reached __________ Date __________ Time __________ Amount reached __________ Date __________ Time __________ Amount reached __________ Date __________ Time __________ Amount reached __________ Date __________ Time __________ Amount reached __________ Date __________ Time __________ Amount reached __________ Date __________ Time __________ Amount reached __________ Date __________ Time __________ Amount reached __________ This information is not intended to replace advice given to you by your health care provider. Make sure you discuss any questions you have with your health care provider. Document Revised: 07/13/2019 Document Reviewed: 07/13/2019 Elsevier Patient Education  [place]. Knee Arthroscopy, Care After This sheet gives you information about how to care for yourself after your  procedure. Your  health care provider may also give you more specific instructions. If you have problems or questions, contact your health care provider. What can I expect after the procedure? After the procedure, it is common to have: Soreness. Swelling. Pain. A small amount of fluid from the incisions. Follow these instructions at home: Medicines Take over-the-counter and prescription medicines only as told by your health care provider. Ask your health care provider if the medicine prescribed to you: Requires you to avoid driving or using machinery. Can cause constipation. You may need to take these actions to prevent or treat constipation: Drink enough fluid to keep your urine pale yellow. Take over-the-counter or prescription medicines. Eat foods that are high in fiber, such as beans, whole grains, and fresh fruits and vegetables. Limit foods that are high in fat and processed sugars, such as fried or sweet foods. If you have a brace or immobilizer: Wear it as told by your health care provider. Remove it only as told by your health care provider. Loosen it if your toes tingle, become numb, or turn cold and blue. Keep it clean and dry. Bathing Do not take baths, swim, or use a hot tub until your health care provider approves. Ask your health care provider if you may take showers. Keep your bandage (dressing) dry until your health care provider says that it can be removed. If the brace or immobilizer is not waterproof: Do not let it get wet. Cover it with a watertight covering when you take a bath or shower. Incision care  Follow instructions from your health care provider about how to take care of your incisions. Make sure you: Wash your hands with soap and water for at least 20 seconds before and after you change your dressing. If soap and water are not available, use hand sanitizer. Change your dressing as told by your health care provider. Leave stitches (sutures) or adhesive strips in place.  These skin closures may need to stay in place for 2 weeks or longer. If adhesive strip edges start to loosen and curl up, you may trim the loose edges. Do not remove adhesive strips completely unless your health care provider tells you to do that. Check your incision areas every day for signs of infection. Check for: Redness. More swelling or pain. Blood or more fluid. Warmth. Pus or a bad smell. Managing pain, stiffness, and swelling  If directed, put ice on the injured area. To do this: If you have a removable brace or immobilizer, remove it as told by your health care provider. Put ice in a plastic bag or use the icing device (cold therapy unit) that you were given. Follow instructions about how to use the icing device. Place a towel between your skin and the bag or between your skin and the icing device. Leave the ice on for 20 minutes, 2-3 times a day. Remove the ice if your skin turns bright red. This is very important. If you cannot feel pain, heat, or cold, you have a greater risk of damage to the area. Move your toes often to reduce stiffness and swelling. Raise (elevate) the injured area above the level of your heart while you are sitting or lying down. Activity Do not use your knee to support your body weight until your health care provider says that you can. Follow weight-bearing restrictions as told. Use crutches or other devices to help you move around (assistive devices) as told by your health care provider. Ask your health  care provider what activities are safe for you during recovery, and what activities you need to avoid. If physical therapy was prescribed, do exercises as told by your health care provider. Doing exercises may help improve knee movement, range of motion, and flexibility. Do not lift anything that is heavier than 10 lb (4.5 kg), or the limit that you are told, until your health care provider says that it is safe. General instructions Do not drive until your  health care provider approves. You may be able to drive after 1-3 weeks. Do not use any products that contain nicotine or tobacco, such as cigarettes, e-cigarettes, and chewing tobacco. These can delay incision or bone healing after surgery. If you need help quitting, ask your health care provider. Wear compression stockings as told by your health care provider. These stockings help to prevent blood clots and reduce swelling in your legs. Keep all follow-up visits. This is important. Contact a health care provider if: You have any of these signs of infection: Redness or more pain around an incision. Blood or more fluid coming from an incision. Warmth coming from an incision. Pus or a bad smell coming from an incision. More swelling in your knee. A fever or chills. You have severe knee pain, and medicine does not help. An incision opens up. Get help right away if: You have trouble breathing or shortness of breath. You have chest pain. You develop pain or swelling in your lower leg or at the back of your knee. You have numbness or tingling in your lower leg or your foot. You notice that your foot or toes look darker than normal or are cooler than normal. These symptoms may represent a serious problem that is an emergency. Do not wait to see if the symptoms will go away. Get medical help right away. Call your local emergency services (911 in the [place] Do not drive yourself to the hospital. Summary To help relieve pain and swelling, put ice on the injured area for 20 minutes, 2-3 times a day. Raise (elevate) the injured area above the level of your heart while you are sitting or lying down. If physical therapy was prescribed, do exercises as told by your health care provider. Exercises may help improve range of motion. This information is not intended to replace advice given to you by your health care provider. Make sure you discuss any questions you have with your health care  provider. Document Revised: 08/24/2019 Document Reviewed: 08/24/2019 Elsevier Patient Education  [place]. Knee Arthroscopy Knee arthroscopy is a surgery to examine the inside of the knee joint and repair any damage to cartilage, surfaces, and other soft tissues around the joint. You may have this surgery if nonsurgical treatment has not relieved your symptoms. Knee arthroscopy may be used to: Repair a torn ligament or other torn tissues. Ligaments are tissues that connect bones to each other. Remove bone fragments. Remove a fluid-filled sac (cyst). Treat kneecap (patella)problems. Treat septic knee. This is an advanced infection in the knee. Arthroscopic surgery is done using a thin tube that has a light and camera on the end of it (arthroscope). The arthroscope is placed through a small incision, and the camera sends images to a screen in the operating room. The images are used to help perform the surgery. Tell a health care provider about: Any allergies you have. All medicines you are taking, including vitamins, herbs, eye drops, creams, and over-the-counter medicines. Any problems you or family members have had with  anesthetic medicines. Any blood disorders you have. Any surgeries you have had. Any medical conditions you have. Whether you are pregnant or may be pregnant. What are the risks? Generally, this is a safe procedure. However, problems may occur, including: Infection. Bleeding. Allergic reactions to medicines. Damage to blood vessels, nerves, or tissues in the knee. A blood clot that forms in the leg and travels to the lung (pulmonary embolism). Failure of the surgery to relieve symptoms. Knee stiffness. What happens before the procedure? Staying hydrated Follow instructions from your health care provider about hydration, which may include: Up to 2 hours before the procedure - you may continue to drink clear liquids, such as water, clear fruit juice, black  coffee, and plain tea.  Eating and drinking restrictions Follow instructions from your health care provider about eating and drinking, which may include: 8 hours before the procedure - stop eating heavy meals or foods, such as meat, fried foods, or fatty foods. 6 hours before the procedure - stop eating light meals or foods, such as toast or cereal. 6 hours before the procedure - stop drinking milk or drinks that contain milk. 2 hours before the procedure - stop drinking clear liquids. Medicines Ask your health care provider about: Changing or stopping your regular medicines. This is especially important if you are taking diabetes medicines or blood thinners. Taking medicines such as aspirin and ibuprofen. These medicines can thin your blood. Do not take these medicines unless your health care provider tells you to take them. Taking over-the-counter medicines, vitamins, herbs, and supplements. General instructions You may have a physical exam and tests, such as an X-ray, CT scan, or MRI. Do not drink alcohol unless your health care provider says that you can. Do not use any products that contain nicotine or tobacco for at least 4 weeks before the procedure. These products include cigarettes, e-cigarettes, and chewing tobacco. If you need help quitting, ask your health care provider. Plan to have a responsible adult take you home from the hospital or clinic. If you will be going home right after the procedure, plan to have a responsible adult care for you for the time you are told. This is important. Ask your health care provider: How your surgery site will be marked. What steps will be taken to help prevent infection. These steps may include: Removing hair at the surgery site. Washing skin with a germ-killing soap. Taking antibiotic medicine. What happens during the procedure?  An IV will be inserted into one of your veins. You will be given one or more of the following: A medicine to  help you relax (sedative). A medicine to numb the knee area (local anesthetic). A medicine to make you fall asleep (general anesthetic). A medicine that is injected into an area of your body to numb everything below the injection site (regional anesthetic). This may be injected into your groin or thigh. A cuff may be placed around your upper leg to slow blood flow to your lower leg during the procedure. Several small incisions will be made around your knee. Your knee joint will be rinsed (flushed) and filled with sterile saline. This is a germ-free solution made of salt and water. This expands the area to help your surgeon see your joint more clearly. An arthroscope will be passed through one of your incisions, into your knee joint. Other surgical instruments will be passed through the other incisions. Then, your surgeon will examine and repair your knee as needed. The sterile saline will  be drained from your knee, and the cuff will be removed from your upper leg. Your incisions will be closed with adhesive strips or stitches, also called sutures, and covered with a bandage (dressing). The procedure may vary among health care providers and hospitals. What happens after the procedure?  Your blood pressure, heart rate, breathing rate, and blood oxygen level will be monitored until you leave the hospital or clinic. You will be given pain medicine as needed. You may be given medicine to lower your risk of blood clots. You may have to wear compression stockings. These stockings help to prevent blood clots and reduce swelling in your legs. You may be given a knee brace or immobilizer. Do not drive or use machinery until your health care provider approves. Summary Knee arthroscopy is a surgery to examine or repair the inside of your knee joint. Before the procedure, follow instructions from your health care provider about eating and drinking. Plan to have a responsible adult take you home from the  hospital or clinic. This information is not intended to replace advice given to you by your health care provider. Make sure you discuss any questions you have with your health care provider. Document Revised: 08/24/2019 Document Reviewed: 08/24/2019 Elsevier Patient Education  [place] Anesthesia, Adult, Care After The following information offers guidance on how to care for yourself after your procedure. Your health care provider may also give you more specific instructions. If you have problems or questions, contact your health care provider. What can I expect after the procedure? After the procedure, it is common for people to: Have pain or discomfort at the IV site. Have nausea or vomiting. Have a sore throat or hoarseness. Have trouble concentrating. Feel cold or chills. Feel weak, sleepy, or tired (fatigue). Have soreness and body aches. These can affect parts of the body that were not involved in surgery. Follow these instructions at home: For the time period you were told by your health care provider:  Rest. Do not participate in activities where you could fall or become injured. Do not drive or use machinery. Do not drink alcohol. Do not take sleeping pills or medicines that cause drowsiness. Do not make important decisions or sign legal documents. Do not take care of children on your own. General instructions Drink enough fluid to keep your urine pale yellow. If you have sleep apnea, surgery and certain medicines can increase your risk for breathing problems. Follow instructions from your health care provider about wearing your sleep device: Anytime you are sleeping, including during daytime naps. While taking prescription pain medicines, sleeping medicines, or medicines that make you drowsy. Return to your normal activities as told by your health care provider. Ask your health care provider what activities are safe for you. Take over-the-counter and  prescription medicines only as told by your health care provider. Do not use any products that contain nicotine or tobacco. These products include cigarettes, chewing tobacco, and vaping devices, such as e-cigarettes. These can delay incision healing after surgery. If you need help quitting, ask your health care provider. Contact a health care provider if: You have nausea or vomiting that does not get better with medicine. You vomit every time you eat or drink. You have pain that does not get better with medicine. You cannot urinate or have bloody urine. You develop a skin rash. You have a fever. Get help right away if: You have trouble breathing. You have chest pain. You vomit blood.  These symptoms may be an emergency. Get help right away. Call 911. Do not wait to see if the symptoms will go away. Do not drive yourself to the hospital. Summary After the procedure, it is common to have a sore throat, hoarseness, nausea, vomiting, or to feel weak, sleepy, or fatigue. For the time period you were told by your health care provider, do not drive or use machinery. Get help right away if you have difficulty breathing, have chest pain, or vomit blood. These symptoms may be an emergency. This information is not intended to replace advice given to you by your health care provider. Make sure you discuss any questions you have with your health care provider. Document Revised: 07/21/2021 Document Reviewed: 07/21/2021 Elsevier Patient Education  [place].

## 2022-03-12 NOTE — H&P (Signed)
Chief Complaint  Patient presents with   Knee Pain      RT knee//pain is getting worse//last injection only lasted about 3 or 4 days    [name] was seen 9 October she had a lateral meniscal tear she was having lateral pain we suggested surgery but she wanted to try an injection which we did.  It only lasted 3 to 4 days   She says she cannot continue to live with the knee as it is because she is too active   We will schedule surgery on November 7 she has a [birth year].  She is anxious to get back into her yard   She has full extension of the knee some pain with flexion lateral joint line tenderness   We are planning to do arthroscopy right knee lateral meniscectomy   Same-day surgery she will need a walker 3 to 5 days.  She will be weightbearing as tolerated   We discussed DVT and swelling is possible postop complications with 3 to 4-week recovery.  Past Medical History:  Diagnosis Date   Bladder infection, chronic    Cancer ([place]) 02/2018   right breast cancer   Complication of anesthesia    GERD (gastroesophageal reflux disease)    Hemorrhoids    Hypercholesterolemia    Polymyalgia rheumatica (HCC)    PONV (postoperative nausea and vomiting)    S/P colonoscopy Jun 14, 2004   friable internal and external hemorrhoids, left-sided diverticula   S/P endoscopy August 13, 2006   pale 1-3 mm nodules consistent with benign squamous papilloma, tiny hiatal hernia   Serrated adenoma of colon     Past Surgical History:  Procedure Laterality Date   ABDOMINAL HYSTERECTOMY     BREAST LUMPECTOMY WITH RADIOACTIVE SEED LOCALIZATION Right 03/12/2018   Procedure: RIGHT BREAST LUMPECTOMY WITH RADIOACTIVE SEED LOCALIZATION;  Surgeon: Fanny Skates, MD;  Location: [facility];  Service: General;  Laterality: Right;   CHOLECYSTECTOMY     COLONOSCOPY  11/20/2010   Dr. Gala Romney- serrated adenomaL side diverticulosis, hemorrhoids   COLONOSCOPY  06/14/04   friable internal and  external hemorrhoids, left-sided diverticula   COLONOSCOPY N/A 08/23/2016   Dr. Gala Romney: Diverticulosis, medium-sized grade 2 internal hemorrhoids.   ESOPHAGOGASTRODUODENOSCOPY  08/13/06   pale 1-3 mm nodules consistent with benign squamous papilloma, tiny hiatal hernia   ESOPHAGOGASTRODUODENOSCOPY N/A 08/23/2016   Dr. Gala Romney: Normal   FLEXIBLE SIGMOIDOSCOPY  11/15/2011   Procedure: FLEXIBLE SIGMOIDOSCOPY;  Surgeon: Daneil Dolin, MD;  [facility];  Service: Endoscopy;  Laterality: N/A;  12:30PM   WRIST FRACTURE SURGERY Right     Family History  Problem Relation Age of Onset   Thyroid cancer Mother    Parkinson's disease Sister    Thyroid disease Brother    Aneurysm Sister    Thyroid cancer Other    Thyroid cancer Other    Colon cancer Neg Hx     Social History   Tobacco Use   Smoking status: Never   Smokeless tobacco: Never  Vaping Use   Vaping Use: Never used  Substance Use Topics   Alcohol use: Yes    Alcohol/week: 2.0 standard drinks of alcohol    Types: 2 Glasses of wine per week    Comment: Drinks wine 2-3 days per week ( usually a couple of glasses each time)   Drug use: No    Current Outpatient Medications  Medication Instructions   acetaminophen (TYLENOL) 500-1,000 mg, Oral, Every 6 hours  PRN   Calcium-Magnesium-Vitamin D (CALCIUM 1200+D3 PO) 1 tablet, Oral, Every morning   dasatinib (SPRYCEL) 20 MG tablet TAKE 1 TABLET (20 MG TOTAL) BY MOUTH DAILY.   Magnesium 500 mg, Oral, Every morning   Multiple Vitamin (MULTIVITAMIN WITH MINERALS) TABS tablet 1 tablet, Oral, Every morning, Centrum Silver   polyethylene glycol (MIRALAX / GLYCOLAX) 17 g, Oral, Every morning   tamoxifen (NOLVADEX) 20 mg, Oral, Daily   Wheat Dextrin (BENEFIBER) POWD 1 Dose, Oral, Daily    Allergies  Allergen Reactions   Fish Allergy Nausea And Vomiting   Shrimp [Shellfish Allergy] Nausea And Vomiting         Chief Complaint  Patient presents with   Knee Pain      R/it is  hurting and I am here to discuss surgery.    87 year old female referred to me by Dr. Luna Glasgow for possible surgery.  The patient woke up late August with pain in her right knee severe enough to interfere with her activities of daily living   She tells me that she takes care of her yard which includes mowing weed eating and tending flower beds and she is very active   She has a trip scheduled for the end of this month   She had 1 injection in her right knee did not get much improvement went for MRI and it shows she has a anterior horn lateral meniscus tear along with degenerative arthritis of the knee   Her plain films only show mild arthritis   Her complaints are of diffuse knee pain which is now progressed to some right hip pain which radiates down the right leg   Examination of the right lower extremity reveals tenderness in the lateral compartment medial compartment.  Small effusion.  Flexion 125 degrees extension just shy of full extension   Her gait is notable for limp   She is neurovascular intact   She is awake and alert she is oriented x3 mood and affect are normal   Body habitus is ectomorphic   We discussed surgical versus nonsurgical options   She is currently on ibuprofen 2 tablets as needed for pain   She does have frequent feelings that the knee will give way and its unstable to her

## 2022-03-13 ENCOUNTER — Other Ambulatory Visit: Payer: Self-pay

## 2022-03-13 ENCOUNTER — Ambulatory Visit (HOSPITAL_COMMUNITY)
Admission: RE | Admit: 2022-03-13 | Discharge: 2022-03-13 | Disposition: A | Discharge status: Home / Self Care | Payer: PPO | Attending: Orthopedic Surgery | Admitting: Orthopedic Surgery

## 2022-03-13 ENCOUNTER — Encounter (HOSPITAL_COMMUNITY): Payer: Self-pay | Admitting: Orthopedic Surgery

## 2022-03-13 ENCOUNTER — Ambulatory Visit (HOSPITAL_COMMUNITY): Payer: PPO | Admitting: Anesthesiology

## 2022-03-13 ENCOUNTER — Ambulatory Visit (HOSPITAL_BASED_OUTPATIENT_CLINIC_OR_DEPARTMENT_OTHER): Payer: PPO | Admitting: Anesthesiology

## 2022-03-13 ENCOUNTER — Encounter (HOSPITAL_COMMUNITY)
Admission: RE | Disposition: A | Discharge status: Home / Self Care | Payer: Self-pay | Source: Home / Self Care | Attending: Orthopedic Surgery

## 2022-03-13 DIAGNOSIS — D649 Anemia, unspecified: Secondary | ICD-10-CM

## 2022-03-13 DIAGNOSIS — S83281S Other tear of lateral meniscus, current injury, right knee, sequela: Secondary | ICD-10-CM | POA: Diagnosis not present

## 2022-03-13 DIAGNOSIS — K219 Gastro-esophageal reflux disease without esophagitis: Secondary | ICD-10-CM | POA: Diagnosis not present

## 2022-03-13 DIAGNOSIS — M94261 Chondromalacia, right knee: Secondary | ICD-10-CM

## 2022-03-13 DIAGNOSIS — M1711 Unilateral primary osteoarthritis, right knee: Secondary | ICD-10-CM | POA: Insufficient documentation

## 2022-03-13 DIAGNOSIS — S83281A Other tear of lateral meniscus, current injury, right knee, initial encounter: Secondary | ICD-10-CM | POA: Diagnosis not present

## 2022-03-13 DIAGNOSIS — S83271A Complex tear of lateral meniscus, current injury, right knee, initial encounter: Secondary | ICD-10-CM

## 2022-03-13 DIAGNOSIS — X58XXXA Exposure to other specified factors, initial encounter: Secondary | ICD-10-CM | POA: Insufficient documentation

## 2022-03-13 DIAGNOSIS — C9210 Chronic myeloid leukemia, BCR/ABL-positive, not having achieved remission: Secondary | ICD-10-CM | POA: Insufficient documentation

## 2022-03-13 HISTORY — PX: KNEE ARTHROSCOPY WITH LATERAL MENISECTOMY: SHX6193

## 2022-03-13 SURGERY — ARTHROSCOPY, KNEE, WITH LATERAL MENISCECTOMY
Anesthesia: General | Site: Knee | Laterality: Right

## 2022-03-13 MED ORDER — ONDANSETRON HCL 4 MG/2ML IJ SOLN: 4.0000 mg | Freq: Once | INTRAMUSCULAR | Status: DC | PRN

## 2022-03-13 MED ORDER — ORAL CARE MOUTH RINSE: 15.0000 mL | Freq: Once | OROMUCOSAL | Status: AC

## 2022-03-13 MED ORDER — IBUPROFEN 400 MG PO TABS
400.0000 mg | ORAL_TABLET | Freq: Once | ORAL | Status: AC
  Administered 2022-03-13: 400 mg via ORAL
  Filled 2022-03-13: qty 1

## 2022-03-13 MED ORDER — HYDROCODONE-ACETAMINOPHEN 5-325 MG PO TABS: 1.0000 | ORAL_TABLET | Freq: Four times a day (QID) | ORAL | 0 refills | Status: AC | PRN

## 2022-03-13 MED ORDER — DEXAMETHASONE SODIUM PHOSPHATE 10 MG/ML IJ SOLN
INTRAMUSCULAR | Status: DC | PRN
  Administered 2022-03-13: 10 mg via INTRAVENOUS

## 2022-03-13 MED ORDER — LACTATED RINGERS IV SOLN: INTRAVENOUS | Status: DC

## 2022-03-13 MED ORDER — ACETAMINOPHEN 500 MG PO TABS
500.0000 mg | ORAL_TABLET | Freq: Once | ORAL | Status: AC
  Administered 2022-03-13: 500 mg via ORAL
  Filled 2022-03-13: qty 1

## 2022-03-13 MED ORDER — SUGAMMADEX SODIUM 200 MG/2ML IV SOLN
INTRAVENOUS | Status: DC | PRN
  Administered 2022-03-13: 200 mg via INTRAVENOUS

## 2022-03-13 MED ORDER — HYDROMORPHONE HCL 1 MG/ML IJ SOLN: 0.2500 mg | INTRAMUSCULAR | Status: DC | PRN

## 2022-03-13 MED ORDER — EPINEPHRINE PF 1 MG/ML IJ SOLN
INTRAMUSCULAR | Status: AC
  Filled 2022-03-13: qty 6

## 2022-03-13 MED ORDER — ONDANSETRON HCL 4 MG/2ML IJ SOLN
INTRAMUSCULAR | Status: AC
  Filled 2022-03-13: qty 2

## 2022-03-13 MED ORDER — PROPOFOL 10 MG/ML IV BOLUS
INTRAVENOUS | Status: DC | PRN
  Administered 2022-03-13: 150 mg via INTRAVENOUS
  Administered 2022-03-13: 100 mg via INTRAVENOUS

## 2022-03-13 MED ORDER — SODIUM CHLORIDE 0.9 % IR SOLN
Status: DC | PRN
  Administered 2022-03-13 (×2): 3000 mL

## 2022-03-13 MED ORDER — PROPOFOL 10 MG/ML IV BOLUS
INTRAVENOUS | Status: AC
  Filled 2022-03-13: qty 20

## 2022-03-13 MED ORDER — BUPIVACAINE-EPINEPHRINE (PF) 0.5% -1:200000 IJ SOLN
INTRAMUSCULAR | Status: AC
  Filled 2022-03-13: qty 30

## 2022-03-13 MED ORDER — LIDOCAINE HCL (PF) 2 % IJ SOLN
INTRAMUSCULAR | Status: AC
  Filled 2022-03-13: qty 5

## 2022-03-13 MED ORDER — LIDOCAINE 2% (20 MG/ML) 5 ML SYRINGE
INTRAMUSCULAR | Status: DC | PRN
  Administered 2022-03-13: 60 mg via INTRAVENOUS

## 2022-03-13 MED ORDER — CEFAZOLIN SODIUM-DEXTROSE 2-4 GM/100ML-% IV SOLN
INTRAVENOUS | Status: AC
  Filled 2022-03-13: qty 100

## 2022-03-13 MED ORDER — PHENYLEPHRINE 80 MCG/ML (10ML) SYRINGE FOR IV PUSH (FOR BLOOD PRESSURE SUPPORT)
PREFILLED_SYRINGE | INTRAVENOUS | Status: AC
  Filled 2022-03-13: qty 10

## 2022-03-13 MED ORDER — FENTANYL CITRATE (PF) 100 MCG/2ML IJ SOLN
INTRAMUSCULAR | Status: DC | PRN
  Administered 2022-03-13: 50 ug via INTRAVENOUS

## 2022-03-13 MED ORDER — FENTANYL CITRATE (PF) 100 MCG/2ML IJ SOLN
INTRAMUSCULAR | Status: AC
  Filled 2022-03-13: qty 2

## 2022-03-13 MED ORDER — PHENYLEPHRINE 80 MCG/ML (10ML) SYRINGE FOR IV PUSH (FOR BLOOD PRESSURE SUPPORT)
PREFILLED_SYRINGE | INTRAVENOUS | Status: DC | PRN
  Administered 2022-03-13: 160 ug via INTRAVENOUS

## 2022-03-13 MED ORDER — PROMETHAZINE HCL 12.5 MG PO TABS: 12.5000 mg | ORAL_TABLET | Freq: Four times a day (QID) | ORAL | 0 refills | Status: DC | PRN

## 2022-03-13 MED ORDER — CEFAZOLIN SODIUM-DEXTROSE 2-4 GM/100ML-% IV SOLN
2.0000 g | INTRAVENOUS | Status: AC
  Administered 2022-03-13: 2 g via INTRAVENOUS

## 2022-03-13 MED ORDER — ONDANSETRON HCL 4 MG/2ML IJ SOLN
INTRAMUSCULAR | Status: DC | PRN
  Administered 2022-03-13: 4 mg via INTRAVENOUS

## 2022-03-13 MED ORDER — CHLORHEXIDINE GLUCONATE 0.12 % MT SOLN
15.0000 mL | Freq: Once | OROMUCOSAL | Status: AC
  Administered 2022-03-13: 15 mL via OROMUCOSAL

## 2022-03-13 MED ORDER — ROCURONIUM BROMIDE 10 MG/ML (PF) SYRINGE
PREFILLED_SYRINGE | INTRAVENOUS | Status: DC | PRN
  Administered 2022-03-13: 40 mg via INTRAVENOUS

## 2022-03-13 MED ORDER — BUPIVACAINE-EPINEPHRINE (PF) 0.5% -1:200000 IJ SOLN
INTRAMUSCULAR | Status: DC | PRN
  Administered 2022-03-13: 30 mL via PERINEURAL

## 2022-03-13 SURGICAL SUPPLY — 42 items
APL PRP STRL LF DISP 70% ISPRP (MISCELLANEOUS) ×1
BLADE SURG SZ11 CARB STEEL (BLADE) ×1 IMPLANT
BNDG CMPR MED 10X6 ELC LF (GAUZE/BANDAGES/DRESSINGS) ×1
BNDG CMPR STD VLCR NS LF 5.8X6 (GAUZE/BANDAGES/DRESSINGS) ×1
BNDG ELASTIC 6X10 VLCR STRL LF (GAUZE/BANDAGES/DRESSINGS) IMPLANT
BNDG ELASTIC 6X5.8 VLCR NS LF (GAUZE/BANDAGES/DRESSINGS) ×1 IMPLANT
CHLORAPREP W/TINT 26 (MISCELLANEOUS) ×2 IMPLANT
CLOTH BEACON ORANGE TIMEOUT ST (SAFETY) ×1 IMPLANT
COOLER ICEMAN CLASSIC (MISCELLANEOUS) ×1 IMPLANT
DECANTER SPIKE VIAL GLASS SM (MISCELLANEOUS) ×2 IMPLANT
GAUZE SPONGE 4X4 12PLY STRL (GAUZE/BANDAGES/DRESSINGS) ×1 IMPLANT
GAUZE XEROFORM 5X9 LF (GAUZE/BANDAGES/DRESSINGS) ×1 IMPLANT
GLOVE BIOGEL PI IND STRL 7.0 (GLOVE) ×2 IMPLANT
GLOVE SS N UNI LF 8.5 STRL (GLOVE) ×1 IMPLANT
GLOVE SURG POLYISO LF SZ8 (GLOVE) ×1 IMPLANT
GLOVE SURG SS PI 7.0 STRL IVOR (GLOVE) IMPLANT
GOWN STRL REUS W/TWL LRG LVL3 (GOWN DISPOSABLE) ×1 IMPLANT
GOWN STRL REUS W/TWL XL LVL3 (GOWN DISPOSABLE) ×1 IMPLANT
IV NS IRRIG 3000ML ARTHROMATIC (IV SOLUTION) ×2 IMPLANT
KIT BLADEGUARD II DBL (SET/KITS/TRAYS/PACK) ×1 IMPLANT
KIT TURNOVER CYSTO (KITS) ×1 IMPLANT
MANIFOLD NEPTUNE II (INSTRUMENTS) ×1 IMPLANT
MARKER SKIN DUAL TIP RULER LAB (MISCELLANEOUS) ×1 IMPLANT
NDL HYPO 18GX1.5 BLUNT FILL (NEEDLE) ×1 IMPLANT
NDL HYPO 21X1.5 SAFETY (NEEDLE) ×1 IMPLANT
NEEDLE HYPO 18GX1.5 BLUNT FILL (NEEDLE) ×1 IMPLANT
NEEDLE HYPO 21X1.5 SAFETY (NEEDLE) ×1 IMPLANT
PACK ARTHRO LIMB DRAPE STRL (MISCELLANEOUS) ×1 IMPLANT
PAD ABD 5X9 TENDERSORB (GAUZE/BANDAGES/DRESSINGS) ×1 IMPLANT
PAD ARMBOARD 7.5X6 YLW CONV (MISCELLANEOUS) ×1 IMPLANT
PAD COLD SHLDR SM WRAP-ON (PAD) IMPLANT
PAD FOR LEG HOLDER (MISCELLANEOUS) ×1 IMPLANT
PADDING CAST COTTON 6X4 STRL (CAST SUPPLIES) ×1 IMPLANT
PORT APPOLLO RF 90DEGREE MULTI (SURGICAL WAND) IMPLANT
RESECTOR TORPEDO 4MM 13CM CVD (MISCELLANEOUS) IMPLANT
SET ARTHROSCOPY INST (INSTRUMENTS) ×1 IMPLANT
SET BASIN LINEN APH (SET/KITS/TRAYS/PACK) ×1 IMPLANT
SUT ETHILON 3 0 FSL (SUTURE) ×1 IMPLANT
SYR 10ML LL (SYRINGE) IMPLANT
SYR 30ML LL (SYRINGE) ×1 IMPLANT
TUBE CONNECTING 12X1/4 (SUCTIONS) ×2 IMPLANT
TUBING IN/OUT FLOW W/MAIN PUMP (TUBING) ×1 IMPLANT

## 2022-03-13 NOTE — Interval H&P Note (Signed)
History and Physical Interval Note:  03/13/2022 9:58 AM  [name]  has presented today for surgery, with the diagnosis of Right knee torn lateral meniscus.  The various methods of treatment have been discussed with the patient and family. After consideration of risks, benefits and other options for treatment, the patient has consented to  Procedure(s): KNEE ARTHROSCOPY WITH LATERAL MENISCECTOMY (Right) as a surgical intervention.  The patient's history has been reviewed, patient examined, no change in status, stable for surgery.  I have reviewed the patient's chart and labs.  Questions were answered to the patient's satisfaction.     Arther Abbott

## 2022-03-13 NOTE — Anesthesia Postprocedure Evaluation (Signed)
Anesthesia Post Note  Patient: [name]  Procedure(s) Performed: KNEE ARTHROSCOPY WITH LATERAL MENISCECTOMY (Right: Knee)  Patient location during evaluation: Phase II Anesthesia Type: General Level of consciousness: awake and alert, oriented and awake Pain management: pain level controlled Vital Signs Assessment: post-procedure vital signs reviewed and stable Respiratory status: spontaneous breathing, nonlabored ventilation and respiratory function stable Cardiovascular status: blood pressure returned to baseline and stable Postop Assessment: no apparent nausea or vomiting Anesthetic complications: no  No notable events documented.   Last Vitals:  Vitals:   03/13/22 1145 03/13/22 1205  BP: (!) 143/79 97/79  Pulse: 70 74  Resp: 14 15  Temp:  36.6 C  SpO2:  100%    Last Pain:  Vitals:   03/13/22 1205  TempSrc: Oral  PainSc: 2                  [name]

## 2022-03-13 NOTE — Brief Op Note (Signed)
03/13/2022  11:07 AM  PATIENT:  [name]  87 y.o. female  PRE-OPERATIVE DIAGNOSIS:  Right knee torn lateral meniscus  POST-OPERATIVE DIAGNOSIS:  Right knee torn lateral meniscus  PROCEDURE:  Procedure(s): KNEE ARTHROSCOPY WITH LATERAL MENISCECTOMY (Right)  SURGEON:  Surgeon(s) and Role:    Carole Civil, MD - Primary  PHYSICIAN ASSISTANT:   ASSISTANTS: none   ANESTHESIA:   general  EBL:  none   BLOOD ADMINISTERED:none  DRAINS: none   LOCAL MEDICATIONS USED:  MARCAINE     SPECIMEN:  No Specimen  DISPOSITION OF SPECIMEN:  N/A  COUNTS:  YES  TOURNIQUET:  * No tourniquets in log *  DICTATION: .Dragon Dictation  PLAN OF CARE: Discharge to home after PACU  PATIENT DISPOSITION:  PACU - hemodynamically stable.   Delay start of Pharmacological VTE agent (>24hrs) due to surgical blood loss or risk of bleeding: not applicable

## 2022-03-13 NOTE — Transfer of Care (Signed)
Immediate Anesthesia Transfer of Care Note  Patient: [name]  Procedure(s) Performed: KNEE ARTHROSCOPY WITH LATERAL MENISCECTOMY (Right: Knee)  Patient Location: PACU  Anesthesia Type:General  Level of Consciousness: awake, alert , oriented, and patient cooperative  Airway & Oxygen Therapy: Patient Spontanous Breathing and Patient connected to nasal cannula oxygen  Post-op Assessment: Report given to RN, Post -op Vital signs reviewed and stable, and Patient moving all extremities  Post vital signs: Reviewed and stable  Last Vitals:  Vitals Value Taken Time  BP 135/55 03/13/22 1119  Temp    Pulse 70 03/13/22 1121  Resp 13 03/13/22 1121  SpO2 99 % 03/13/22 1121  Vitals shown include unvalidated device data.  Last Pain:  Vitals:   03/13/22 0904  PainSc: 0-No pain         Complications: No notable events documented.

## 2022-03-13 NOTE — Anesthesia Procedure Notes (Signed)
Procedure Name: Intubation Date/Time: 03/13/2022 10:32 AM  Performed by: Myna Bright, CRNAPre-anesthesia Checklist: Patient identified, Emergency Drugs available, Suction available and Patient being monitored Patient Re-evaluated:Patient Re-evaluated prior to induction Oxygen Delivery Method: Circle system utilized Preoxygenation: Pre-oxygenation with 100% oxygen Induction Type: IV induction Ventilation: Mask ventilation without difficulty LMA: LMA inserted LMA Size: 4.0 Laryngoscope Size: Glidescope and 3 Grade View: Grade I Tube type: Oral Tube size: 7.0 mm Number of attempts: 1 Airway Equipment and Method: Stylet and Video-laryngoscopy Placement Confirmation: ETT inserted through vocal cords under direct vision, positive ETCO2 and breath sounds checked- equal and bilateral Secured at: 21 cm Tube secured with: Tape Dental Injury: Teeth and Oropharynx as per pre-operative assessment  Comments: Easy mask airway. LMA #4 placed, but did not seal adequately. Removed LMA and resumed mask ventilation. SaO2 remained 100% throughout. DL with Glidescope only d/t less than ideal patient positioning. Good view with Glide, Atraumatic oral intubation.

## 2022-03-13 NOTE — Anesthesia Preprocedure Evaluation (Addendum)
Anesthesia Evaluation  Patient identified by MRN, date of birth, ID band Patient awake    Reviewed: Allergy & Precautions, H&P , NPO status , Patient's Chart, lab work & pertinent test results  History of Anesthesia Complications (+) PONV and history of anesthetic complications  Airway Mallampati: I  TM Distance: >3 FB Neck ROM: Full    Dental  (+) Dental Advisory Given, Missing, Implants   Pulmonary neg pulmonary ROS   Pulmonary exam normal breath sounds clear to auscultation       Cardiovascular negative cardio ROS Normal cardiovascular exam Rhythm:Regular Rate:Normal     Neuro/Psych negative neurological ROS  negative psych ROS   GI/Hepatic Neg liver ROS,GERD  Controlled,,  Endo/Other  negative endocrine ROS    Renal/GU negative Renal ROS  negative genitourinary   Musculoskeletal negative musculoskeletal ROS (+)    Abdominal   Peds negative pediatric ROS (+)  Hematology  (+) Blood dyscrasia (CML), anemia   Anesthesia Other Findings   Reproductive/Obstetrics negative OB ROS                             Anesthesia Physical Anesthesia Plan  ASA: 2  Anesthesia Plan: General   Post-op Pain Management: Minimal or no pain anticipated   Induction: Intravenous  PONV Risk Score and Plan: 4 or greater and Ondansetron and Dexamethasone  Airway Management Planned: LMA  Additional Equipment:   Intra-op Plan:   Post-operative Plan:   Informed Consent: I have reviewed the patients History and Physical, chart, labs and discussed the procedure including the risks, benefits and alternatives for the proposed anesthesia with the patient or authorized representative who has indicated his/her understanding and acceptance.     Dental advisory given  Plan Discussed with: CRNA and Surgeon  Anesthesia Plan Comments:        Anesthesia Quick Evaluation

## 2022-03-13 NOTE — Interval H&P Note (Signed)
History and Physical Interval Note:  03/13/2022 9:57 AM  [name]  has presented today for surgery, with the diagnosis of Right knee torn lateral meniscus.  The various methods of treatment have been discussed with the patient and family. After consideration of risks, benefits and other options for treatment, the patient has consented to  Procedure(s): KNEE ARTHROSCOPY WITH LATERAL MENISCECTOMY (Right) as a surgical intervention.  The patient's history has been reviewed, patient examined, no change in status, stable for surgery.  I have reviewed the patient's chart and labs.  Questions were answered to the patient's satisfaction.     Arther Abbott

## 2022-03-13 NOTE — Op Note (Signed)
03/13/2022  11:07 AM  PATIENT:  [name]  87 y.o. female  PRE-OPERATIVE DIAGNOSIS:  Right knee torn lateral meniscus  POST-OPERATIVE DIAGNOSIS:  Right knee torn lateral meniscus  PROCEDURE:  Procedure(s): KNEE ARTHROSCOPY WITH LATERAL MENISCECTOMY (Right)  Surgical findings  Medial compartment normal meniscus, diffuse grade I and II chondromalacia medial femoral condyle medial tibial plateau normal  ACL PCL normal  Lateral compartment torn anterior horn lateral meniscus complex tear Grade 3 chondral changes tibial plateau Grade 2 diffuse chondral changes lateral femoral condyle  Trochlea and patellofemoral joint grade 3 chondral changes in the trochlea grade 2 chondral changes on the medial lateral facet  Synovitis: Mild  Procedure was formed as follows I saw the patient in the preop area to confirm the right knee as the surgical site and marked it I reviewed the images and the history and physical and cleared her for surgery  She was taken to the operating room for general anesthesia with an LMA she was in the supine position.  The left leg was placed on a padded well leg holder placed in flexion the right leg was placed in an arthroscopic leg holder.  A brief exam under anesthesia revealed no additional findings  The right leg was prepped and draped sterilely.  Timeout was executed  Standard lateral portal was placed the scope was placed into the joint a diagnostic arthroscopy was performed we reviewed all compartments of the joint a list of the findings above  I made another portal medially with the assistance of a spinal needle dilated with a trocar which was blunt and then passed the shaver a curved torpedo shaver from [employer]th[employer] and debrided the torn pieces of the anterior horn which were multiple and in a complex pattern  I did use a 90 degree ArthroCare wand to control bleeding.  I removed some of the synovium for better visualization.  I continued debridement with  the shaver and the ArthroCare wand until a stable rim was performed.  I removed approximately 50% of the anterior horn.  I took the shaver and ran it across the undersurface and edge of the body and lateral meniscus to make sure there were no free fragments and there were not  I irrigated the joint evaluated the patellofemoral area I did not remove any cartilage from that area.  I removed all fluid from the joint injected the joint with Marcaine with epinephrine 30 cc and closed the portals with 3-0 nylon suture  I then placed a sterile dressing along with an Ace bandage and Cryo/Cuff and then the patient was extubated and taken to recovery room in stable condition  Postop plan weight-bear as tolerated immediate range of motion return to the office in a week  SURGEON:  Surgeon(s) and Role:    * Carole Civil, MD - Primary  PHYSICIAN ASSISTANT:   ASSISTANTS: none   ANESTHESIA:   general  EBL:  none   BLOOD ADMINISTERED:none  DRAINS: none   LOCAL MEDICATIONS USED:  MARCAINE     SPECIMEN:  No Specimen  DISPOSITION OF SPECIMEN:  N/A  COUNTS:  YES  TOURNIQUET:  * No tourniquets in log *  DICTATION: .Dragon Dictation  PLAN OF CARE: Discharge to home after PACU  PATIENT DISPOSITION:  PACU - hemodynamically stable.   Delay start of Pharmacological VTE agent (>24hrs) due to surgical blood loss or risk of bleeding: not applicable

## 2022-03-14 ENCOUNTER — Ambulatory Visit: Payer: PPO | Admitting: Orthopedic Surgery

## 2022-03-20 ENCOUNTER — Encounter (HOSPITAL_COMMUNITY): Payer: Self-pay | Admitting: Orthopedic Surgery

## 2022-03-21 ENCOUNTER — Ambulatory Visit (INDEPENDENT_AMBULATORY_CARE_PROVIDER_SITE_OTHER): Payer: PPO | Admitting: Orthopedic Surgery

## 2022-03-21 DIAGNOSIS — Z9889 Other specified postprocedural states: Secondary | ICD-10-CM

## 2022-03-21 NOTE — Progress Notes (Signed)
Postop appointment  Chief Complaint  Patient presents with   Routine Post Op    SARK DOS 03/13/22    PRE-OPERATIVE DIAGNOSIS:  Right knee torn lateral meniscus   POST-OPERATIVE DIAGNOSIS:  Right knee torn lateral meniscus   PROCEDURE:  Procedure(s): KNEE ARTHROSCOPY WITH LATERAL MENISCECTOMY (Right)   Surgical findings   Medial compartment normal meniscus, diffuse grade I and II chondromalacia medial femoral condyle medial tibial plateau normal   ACL PCL normal   Lateral compartment torn anterior horn lateral meniscus complex tear Grade 3 chondral changes tibial plateau Grade 2 diffuse chondral changes lateral femoral condyle   Trochlea and patellofemoral joint grade 3 chondral changes in the trochlea grade 2 chondral changes on the medial lateral facet   Synovitis: Mild  [name] is doing well almost overdoing it according to her son.  She has minimal swelling in her knee her knee is straight she can bend it well she will start some home exercises follow-up in 3 to 4 weeks  She can drive  She can mow her lawn on a riding lawnmower

## 2022-04-02 DIAGNOSIS — R7301 Impaired fasting glucose: Secondary | ICD-10-CM | POA: Diagnosis not present

## 2022-04-02 DIAGNOSIS — E782 Mixed hyperlipidemia: Secondary | ICD-10-CM | POA: Diagnosis not present

## 2022-04-03 ENCOUNTER — Encounter (HOSPITAL_COMMUNITY)
Admission: RE | Admit: 2022-04-03 | Discharge: 2022-04-03 | Disposition: A | Discharge status: Home / Self Care | Payer: PPO | Source: Ambulatory Visit | Attending: Internal Medicine | Admitting: Internal Medicine

## 2022-04-03 ENCOUNTER — Other Ambulatory Visit: Payer: Self-pay | Admitting: *Deleted

## 2022-04-03 VITALS — BP 130/51 | HR 78 | Temp 97.6°F | Resp 20

## 2022-04-03 DIAGNOSIS — M810 Age-related osteoporosis without current pathological fracture: Secondary | ICD-10-CM | POA: Insufficient documentation

## 2022-04-03 MED ORDER — DENOSUMAB 60 MG/ML ~~LOC~~ SOSY
60.0000 mg | PREFILLED_SYRINGE | Freq: Once | SUBCUTANEOUS | Status: AC
  Administered 2022-04-03: 60 mg via SUBCUTANEOUS
  Filled 2022-04-03: qty 1

## 2022-04-03 NOTE — Progress Notes (Signed)
Diagnosis: Osteoporosis  Provider:  Wende Neighbors MD  Procedure: Injection  Prolia (Denosumab), Dose: 60 mg, Site: subcutaneous, Number of injections: 1  Post Care: Patient declined observation  Discharge: Condition: Good, Destination: Home . AVS provided to patient.   Performed by:  Baxter Hire, RN

## 2022-04-04 ENCOUNTER — Telehealth: Payer: Self-pay

## 2022-04-04 ENCOUNTER — Other Ambulatory Visit (HOSPITAL_COMMUNITY): Payer: Self-pay

## 2022-04-04 NOTE — Telephone Encounter (Signed)
Oral Oncology Patient Advocate Encounter   Received notification that patient is due for re-enrollment for assistance for Sprycel through [employer].   Re-enrollment process has been initiated and will be submitted upon completion of necessary documents.  BMS' phone number [phone].   I will continue to follow until final determination.  Berdine Addison, [facility] Patient [place]  [phone] (phone) [phone] (fax) 04/04/2022 3:31 PM

## 2022-04-06 DIAGNOSIS — M791 Myalgia, unspecified site: Secondary | ICD-10-CM | POA: Diagnosis not present

## 2022-04-06 DIAGNOSIS — D72819 Decreased white blood cell count, unspecified: Secondary | ICD-10-CM | POA: Diagnosis not present

## 2022-04-06 DIAGNOSIS — M810 Age-related osteoporosis without current pathological fracture: Secondary | ICD-10-CM | POA: Diagnosis not present

## 2022-04-06 DIAGNOSIS — C9210 Chronic myeloid leukemia, BCR/ABL-positive, not having achieved remission: Secondary | ICD-10-CM | POA: Diagnosis not present

## 2022-04-06 DIAGNOSIS — S83281D Other tear of lateral meniscus, current injury, right knee, subsequent encounter: Secondary | ICD-10-CM | POA: Diagnosis not present

## 2022-04-06 DIAGNOSIS — C50919 Malignant neoplasm of unspecified site of unspecified female breast: Secondary | ICD-10-CM | POA: Diagnosis not present

## 2022-04-06 DIAGNOSIS — E782 Mixed hyperlipidemia: Secondary | ICD-10-CM | POA: Diagnosis not present

## 2022-04-06 DIAGNOSIS — R7301 Impaired fasting glucose: Secondary | ICD-10-CM | POA: Diagnosis not present

## 2022-04-06 DIAGNOSIS — R809 Proteinuria, unspecified: Secondary | ICD-10-CM | POA: Diagnosis not present

## 2022-04-06 DIAGNOSIS — N390 Urinary tract infection, site not specified: Secondary | ICD-10-CM | POA: Diagnosis not present

## 2022-04-09 ENCOUNTER — Telehealth: Payer: Self-pay | Admitting: Pharmacy Technician

## 2022-04-09 NOTE — Telephone Encounter (Signed)
Auth Submission: APPROVED Payer: [employer] ADVT Medication & CPT/J Code(s) submitted: Prolia (Denosumab) [id number] Route of submission (phone, fax, portal):  Phone [phone] Fax [phone] Auth type: Buy/Bill Units/visits requested: X1 Reference number: [id number] Approval from: 04/05/22 to 07/04/22

## 2022-04-11 ENCOUNTER — Ambulatory Visit (INDEPENDENT_AMBULATORY_CARE_PROVIDER_SITE_OTHER): Payer: PPO | Admitting: Orthopedic Surgery

## 2022-04-11 DIAGNOSIS — S83281D Other tear of lateral meniscus, current injury, right knee, subsequent encounter: Secondary | ICD-10-CM

## 2022-04-11 DIAGNOSIS — Z9889 Other specified postprocedural states: Secondary | ICD-10-CM

## 2022-04-11 NOTE — Progress Notes (Signed)
  Postop visit #2 status post arthroscopy right knee partial lateral meniscectomy  The patient is doing well with no complaints the sutures were removed today.  She has full range of motion no swelling and no pain.  My only advice to her is to return to activities gradually  She will see [place] on an as-needed basis

## 2022-04-12 NOTE — Telephone Encounter (Signed)
Left VM for patient to initiate re-enrollment.  Berdine Addison, [facility] Patient [place]  [phone] (phone) [phone] (fax) 04/12/2022 11:23 AM

## 2022-04-17 NOTE — Telephone Encounter (Signed)
Received patient signatures. Will submit once I have received MD signatures.   [name], [facility] Patient [place]  [phone] (phone) [phone] (fax) 04/17/2022 12:08 PM

## 2022-04-17 NOTE — Telephone Encounter (Addendum)
Oral Oncology Patient Advocate Encounter  Reached out and spoke with patient regarding PAP paperwork, explained that I would send it to their preferred email via DocuSign.   Confirmed email address: [email or web address].    Patient expressed understanding and consent.  Will follow up once paperwork has been signed and returned.   Berdine Addison, [facility] Patient [place]  [phone] (phone) [phone] (fax) 04/17/2022 10:01 AM

## 2022-04-20 NOTE — Telephone Encounter (Signed)
Oral Oncology Patient Advocate Encounter   Submitted application for assistance for Sprycel to [employer].   Application submitted via e-fax to [phone]   BMS' phone number [phone].   I will continue to check the status until final determination.   Berdine Addison, [facility] Patient [place]  [phone] (phone) [phone] (fax) 04/20/2022 3:18 PM

## 2022-05-08 ENCOUNTER — Other Ambulatory Visit (HOSPITAL_COMMUNITY): Payer: Self-pay | Admitting: Internal Medicine

## 2022-05-08 DIAGNOSIS — M810 Age-related osteoporosis without current pathological fracture: Secondary | ICD-10-CM

## 2022-05-09 ENCOUNTER — Other Ambulatory Visit (HOSPITAL_COMMUNITY): Payer: Self-pay | Admitting: Internal Medicine

## 2022-05-09 ENCOUNTER — Inpatient Hospital Stay: Payer: PPO | Attending: Hematology

## 2022-05-09 DIAGNOSIS — M810 Age-related osteoporosis without current pathological fracture: Secondary | ICD-10-CM | POA: Insufficient documentation

## 2022-05-09 DIAGNOSIS — Z79899 Other long term (current) drug therapy: Secondary | ICD-10-CM | POA: Diagnosis not present

## 2022-05-09 DIAGNOSIS — D0511 Intraductal carcinoma in situ of right breast: Secondary | ICD-10-CM | POA: Diagnosis not present

## 2022-05-09 DIAGNOSIS — Z79810 Long term (current) use of selective estrogen receptor modulators (SERMs): Secondary | ICD-10-CM | POA: Diagnosis not present

## 2022-05-09 DIAGNOSIS — C9210 Chronic myeloid leukemia, BCR/ABL-positive, not having achieved remission: Secondary | ICD-10-CM

## 2022-05-09 DIAGNOSIS — Z9889 Other specified postprocedural states: Secondary | ICD-10-CM

## 2022-05-09 DIAGNOSIS — Z1231 Encounter for screening mammogram for malignant neoplasm of breast: Secondary | ICD-10-CM

## 2022-05-09 LAB — CBC WITH DIFFERENTIAL/PLATELET
Abs Immature Granulocytes: 0.01 10*3/uL (ref 0.00–0.07)
Basophils Absolute: 0 10*3/uL (ref 0.0–0.1)
Basophils Relative: 1 %
Eosinophils Absolute: 0.1 10*3/uL (ref 0.0–0.5)
Eosinophils Relative: 2 %
HCT: 37.1 % (ref 36.0–46.0)
Hemoglobin: 12.1 g/dL (ref 12.0–15.0)
Immature Granulocytes: 0 %
Lymphocytes Relative: 39 %
Lymphs Abs: 1 10*3/uL (ref 0.7–4.0)
MCH: 31.8 pg (ref 26.0–34.0)
MCHC: 32.6 g/dL (ref 30.0–36.0)
MCV: 97.4 fL (ref 80.0–100.0)
Monocytes Absolute: 0.2 10*3/uL (ref 0.1–1.0)
Monocytes Relative: 9 %
Neutro Abs: 1.3 10*3/uL — ABNORMAL LOW (ref 1.7–7.7)
Neutrophils Relative %: 49 %
Platelets: 189 10*3/uL (ref 150–400)
RBC: 3.81 MIL/uL — ABNORMAL LOW (ref 3.87–5.11)
RDW: 13 % (ref 11.5–15.5)
WBC: 2.6 10*3/uL — ABNORMAL LOW (ref 4.0–10.5)
nRBC: 0 % (ref 0.0–0.2)

## 2022-05-09 LAB — COMPREHENSIVE METABOLIC PANEL
ALT: 18 U/L (ref 0–44)
AST: 25 U/L (ref 15–41)
Albumin: 4.2 g/dL (ref 3.5–5.0)
Alkaline Phosphatase: 26 U/L — ABNORMAL LOW (ref 38–126)
Anion gap: 7 (ref 5–15)
BUN: 14 mg/dL (ref 8–23)
CO2: 25 mmol/L (ref 22–32)
Calcium: 8.6 mg/dL — ABNORMAL LOW (ref 8.9–10.3)
Chloride: 107 mmol/L (ref 98–111)
Creatinine, Ser: 0.71 mg/dL (ref 0.44–1.00)
GFR, Estimated: >60 mL/min (ref >60)
Glucose, Bld: 104 mg/dL — ABNORMAL HIGH (ref 70–99)
Potassium: 3.9 mmol/L (ref 3.5–5.1)
Sodium: 139 mmol/L (ref 135–145)
Total Bilirubin: 1 mg/dL (ref 0.3–1.2)
Total Protein: 6.8 g/dL (ref 6.5–8.1)

## 2022-05-09 LAB — VITAMIN D 25 HYDROXY (VIT D DEFICIENCY, FRACTURES): Vit D, 25-Hydroxy: 44.21 ng/mL (ref 30–100)

## 2022-05-15 ENCOUNTER — Other Ambulatory Visit: Payer: Self-pay

## 2022-05-20 LAB — BCR-ABL1, CML/ALL, PCR, QUANT: Interpretation (BCRAL):: NEGATIVE

## 2022-05-23 ENCOUNTER — Encounter: Payer: Self-pay | Admitting: Hematology

## 2022-05-23 ENCOUNTER — Inpatient Hospital Stay (HOSPITAL_BASED_OUTPATIENT_CLINIC_OR_DEPARTMENT_OTHER): Payer: PPO | Admitting: Hematology

## 2022-05-23 VITALS — BP 140/61 | HR 77 | Temp 97.6°F | Resp 18 | Wt 145.1 lb

## 2022-05-23 DIAGNOSIS — C9210 Chronic myeloid leukemia, BCR/ABL-positive, not having achieved remission: Secondary | ICD-10-CM

## 2022-05-23 NOTE — Telephone Encounter (Signed)
Called to check status of application. Informed by representative Raven that Benefits Investigation was almost complete. They will then contact the patient for a Medicare Questionnaire before re-enrolling. I will continue to follow and update until final determination.   Berdine Addison, [facility] Patient [place]  [phone] (phone) [phone] (fax) 05/23/2022 2:57 PM

## 2022-05-23 NOTE — Patient Instructions (Addendum)
[facility]  Discharge Instructions  You were seen and examined today by Dr. Delton Coombes.  Your labs are stable, please continue Sprycel and Tamoxifen. Your CML labs look great.  You will remain on Tamoxifen until December of this year.  Follow-up as scheduled.  Thank you for choosing [facility] to provide your oncology and hematology care.   To afford each patient quality time with our provider, please arrive at least 15 minutes before your scheduled appointment time. You may need to reschedule your appointment if you arrive late (10 or more minutes). Arriving late affects you and other patients whose appointments are after yours.  Also, if you miss three or more appointments without notifying the office, you may be dismissed from the clinic at the provider's discretion.    Again, thank you for choosing [facility].  Our hope is that these requests will decrease the amount of time that you wait before being seen by our physicians.   If you have a lab appointment with the [facility] please come in thru the Main Entrance and check in at the main information desk.           _____________________________________________________________  Should you have questions after your visit to [facility], please contact our office at [phone] and follow the prompts.  Our office hours are 8:00 a.m. to 4:30 p.m. Monday - Thursday and 8:00 a.m. to 2:30 p.m. Friday.  Please note that voicemails left after 4:00 p.m. may not be returned until the following business day.  We are closed weekends and all major holidays.  You do have access to a nurse 24-7, just call the main number to the clinic [phone] and do not press any options, hold on the line and a nurse will answer the phone.    For prescription refill requests, have your pharmacy contact our office and allow 72 hours.    Masks are optional in the cancer  centers. If you would like for your care team to wear a mask while they are taking care of you, please let them know. You may have one support person who is at least 86 years old accompany you for your appointments.

## 2022-05-23 NOTE — Progress Notes (Signed)
[place], [place] [address]   CLINIC:  Medical Oncology/Hematology  PCP:  Celene Squibb, MD 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, a 88 y.o. 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  She is tolerating Dasatinib 20 mg daily Monday through Friday.  She is also taking tamoxifen without any major issues.  Reports energy levels of 100%.  REVIEW OF SYSTEMS:  Review of Systems  Constitutional:  Negative for appetite change and fatigue.  Respiratory:  Negative for shortness of breath.   Cardiovascular:  Negative for chest pain.  Gastrointestinal:  Negative for diarrhea, nausea and vomiting.  Genitourinary:  Positive for frequency.   All other systems reviewed and are negative.   PAST MEDICAL/SURGICAL HISTORY:  Past Medical History:  Diagnosis Date   Bladder infection, chronic    Cancer ([place]) 02/2018   right breast cancer   Complication of anesthesia    GERD (gastroesophageal reflux disease)    Hemorrhoids    Hypercholesterolemia    Polymyalgia rheumatica ([facility])    PONV (postoperative nausea and vomiting)    S/P colonoscopy Jun 14, 2004   friable internal and external hemorrhoids, left-sided diverticula   S/P endoscopy August 13, 2006   pale 1-3 mm nodules consistent with benign squamous papilloma, tiny hiatal hernia   Serrated adenoma of colon    Past Surgical History:  Procedure Laterality Date   ABDOMINAL HYSTERECTOMY     BREAST LUMPECTOMY WITH RADIOACTIVE SEED LOCALIZATION Right 03/12/2018   Procedure: RIGHT BREAST LUMPECTOMY WITH RADIOACTIVE SEED LOCALIZATION;  Surgeon: Fanny Skates, MD;  Location: [facility];  Service: General;  Laterality: Right;   CHOLECYSTECTOMY      COLONOSCOPY  11/20/2010   Dr. Gala Romney- serrated adenomaL side diverticulosis, hemorrhoids   COLONOSCOPY  06/14/04   friable internal and external hemorrhoids, left-sided diverticula   COLONOSCOPY N/A 08/23/2016   Dr. Gala Romney: Diverticulosis, medium-sized grade 2 internal hemorrhoids.   ESOPHAGOGASTRODUODENOSCOPY  08/13/06   pale 1-3 mm nodules consistent with benign squamous papilloma, tiny hiatal hernia   ESOPHAGOGASTRODUODENOSCOPY N/A 08/23/2016   Dr. Gala Romney: Normal   FLEXIBLE SIGMOIDOSCOPY  11/15/2011   Procedure: FLEXIBLE SIGMOIDOSCOPY;  Surgeon: Daneil Dolin, MD;  [facility];  Service: Endoscopy;  Laterality: N/A;  12:30PM   KNEE ARTHROSCOPY WITH LATERAL MENISECTOMY Right 03/13/2022   Procedure: KNEE ARTHROSCOPY WITH LATERAL MENISCECTOMY;  Surgeon: Carole Civil, MD;  Location: [facility];  Service: Orthopedics;  Laterality: Right;   WRIST FRACTURE SURGERY Right     SOCIAL HISTORY:  Social History   Socioeconomic History   Marital status: Widowed    Spouse name: Not on file   Number of children: 2   Years of education: Not on file   Highest education level: Not on file  Occupational History   Not on file  Tobacco Use   Smoking status: Never   Smokeless tobacco: Never  Vaping Use   Vaping Use: Never used  Substance and Sexual Activity   Alcohol use: Yes    Alcohol/week: 2.0 standard drinks of alcohol    Types: 2 Glasses of wine per week    Comment: Drinks wine 2-3 days per week (  usually a couple of glasses each time)   Drug use: No   Sexual activity: Not on file  Other Topics Concern   Not on file  Social History Narrative   Not on file   Social Determinants of Health   Financial Resource Strain: Low Risk  (03/08/2020)   Overall Financial Resource Strain (CARDIA)    Difficulty of Paying Living Expenses: Not hard at all  Food Insecurity: No Food Insecurity (03/08/2020)   Hunger Vital Sign    Worried About Running Out of Food in the Last Year: Never true     Ran Out of Food in the Last Year: Never true  Transportation Needs: No Transportation Needs (03/08/2020)   PRAPARE - Hydrologist (Medical): No    Lack of Transportation (Non-Medical): No  Physical Activity: Insufficiently Active (03/08/2020)   Exercise Vital Sign    Days of Exercise per Week: 7 days    Minutes of Exercise per Session: 20 min  Stress: No Stress Concern Present (03/08/2020)   [place]    Feeling of Stress : Not at all  Social Connections: Moderately Isolated (03/08/2020)   Social Connection and Isolation Panel [NHANES]    Frequency of Communication with Friends and Family: Twice a week    Frequency of Social Gatherings with Friends and Family: Twice a week    Attends Religious Services: More than 4 times per year    Active Member of [employer] or Organizations: No    Attends Archivist Meetings: Never    Marital Status: Widowed  Intimate Partner Violence: Not At Risk (03/08/2020)   Humiliation, Afraid, Rape, and Kick questionnaire    Fear of Current or Ex-Partner: No    Emotionally Abused: No    Physically Abused: No    Sexually Abused: No    FAMILY HISTORY:  Family History  Problem Relation Age of Onset   Thyroid cancer Mother    Parkinson's disease Sister    Thyroid disease Brother    Aneurysm Sister    Thyroid cancer Other    Thyroid cancer Other    Colon cancer Neg Hx     CURRENT MEDICATIONS:  Current Outpatient Medications  Medication Sig Dispense Refill   acetaminophen (TYLENOL) 500 MG tablet Take 500-1,000 mg by mouth every 6 (six) hours as needed (pain.).     Calcium-Magnesium-Vitamin D (CALCIUM 1200+D3 PO) Take 1 tablet by mouth in the morning.     Magnesium 500 MG TABS Take 500 mg by mouth in the morning.     Multiple Vitamin (MULTIVITAMIN WITH MINERALS) TABS tablet Take 1 tablet by mouth in the morning. Centrum Silver     polyethylene glycol  (MIRALAX / GLYCOLAX) packet Take 17 g by mouth in the morning.     tamoxifen (NOLVADEX) 20 MG tablet Take 1 tablet (20 mg total) by mouth daily. 90 tablet 6   Wheat Dextrin (BENEFIBER) POWD Take 1 Dose by mouth daily.     dasatinib (SPRYCEL) 20 MG tablet TAKE 1 TABLET (20 MG TOTAL) BY MOUTH DAILY. (Patient not taking: Reported on 05/23/2022) 30 tablet 3   No current facility-administered medications for this visit.    ALLERGIES:  Allergies  Allergen Reactions   Fish Allergy Nausea And Vomiting   Shrimp [Shellfish Allergy] Nausea And Vomiting    PHYSICAL EXAM:  Performance status (ECOG): 1 - Symptomatic but completely ambulatory  Vitals:   05/23/22 1340  BP: (!) 140/61  Pulse: 77  Resp: 18  Temp: 97.6 F (36.4 C)  SpO2: 96%   Wt Readings from Last 3 Encounters:  05/23/22 145 lb 1 oz (65.8 kg)  03/09/22 143 lb 15.4 oz (65.3 kg)  01/17/22 144 lb (65.3 kg)   Physical Exam Vitals reviewed.  Constitutional:      Appearance: Normal appearance.  Cardiovascular:     Rate and Rhythm: Normal rate and regular rhythm.     Pulses: Normal pulses.     Heart sounds: Normal heart sounds.  Pulmonary:     Effort: Pulmonary effort is normal.     Breath sounds: Normal breath sounds.  Abdominal:     Palpations: Abdomen is soft. There is no hepatomegaly, splenomegaly or mass.     Tenderness: There is no abdominal tenderness.  Musculoskeletal:     Right lower leg: No edema.     Left lower leg: No edema.  Neurological:     General: No focal deficit present.     Mental Status: She is alert and oriented to person, place, and time.  Psychiatric:        Mood and Affect: Mood normal.        Behavior: Behavior normal.      LABORATORY DATA:  I have reviewed the labs as listed.     Latest Ref Rng & Units 05/09/2022    8:57 AM 03/09/2022   12:53 PM 12/13/2021   10:13 AM  CBC  WBC 4.0 - 10.5 K/uL 2.6  3.8  2.7   Hemoglobin 12.0 - 15.0 g/dL 12.1  11.6  11.7   Hematocrit 36.0 - 46.0 % 37.1   34.6  35.8   Platelets 150 - 400 K/uL 189  169  154       Latest Ref Rng & Units 05/09/2022    8:57 AM 03/09/2022   12:53 PM 12/13/2021   10:13 AM  CMP  Glucose 70 - 99 mg/dL 104  93  121   BUN 8 - 23 mg/dL '14  25  18   '$ Creatinine 0.44 - 1.00 mg/dL 0.71  1.00  0.79   Sodium 135 - 145 mmol/L 139  136  137   Potassium 3.5 - 5.1 mmol/L 3.9  4.0  4.2   Chloride 98 - 111 mmol/L 107  105  106   CO2 22 - 32 mmol/L '25  23  25   '$ Calcium 8.9 - 10.3 mg/dL 8.6  8.3  8.6   Total Protein 6.5 - 8.1 g/dL 6.8   6.7   Total Bilirubin 0.3 - 1.2 mg/dL 1.0   0.9   Alkaline Phos 38 - 126 U/L 26   30   AST 15 - 41 U/L 25   25   ALT 0 - 44 U/L 18   20     DIAGNOSTIC IMAGING:  I have independently reviewed the scans and discussed with the patient. No results found.   ASSESSMENT:  1.  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  BCR/ABL by FISH on 02/23/2020 was positive for fusion protein. 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 -Chromosome analysis confirms Philadelphia chromosome. -BCR/ABL by quantitative PCR with B2 A2 transcript 132% -Dasatinib 50 mg daily started on 03/13/2020. - Sprycel dose reduced to 20 mg daily on 05/19/2020 secondary to leukopenia/neutropenia. - Sprycel dose changed to 20 mg daily Monday through Friday due to tiredness and dizziness on 06/05/2021.   2.  Right breast DCIS: - Screening mammogram  on 02/03/2018 was abnormal. -Ultrasound of the right breast on 02/11/2018 shows mass measuring 0.5 x 0.4 x 0.5 cm, circumscribed hypoechoic in the 3:30 o'clock location of the right breast.  Right axilla ultrasound was negative for adenopathy. - Right breast core biopsy on 02/18/2018 shows ductal carcinoma with papillary configuration.  In the comment section, it notes that this may represent DCIS involving a papillary lesion or it may represent an intracystic papillary ductal carcinoma.  ER/PR was 95% positive. - Right breast lumpectomy  on 03/12/2018, pathology showing intermediate grade DCIS, 0.6 cm, with areas showing papillary configuration.  Posterior margin was 0.2 cm.  No evidence of invasion. - Radiation therapy was refused. -Tamoxifen for 5 years started on 04/09/2018.    3.  Osteoporosis: -DEXA scan on 04/11/2018 with T score of -2.9. -DEXA scan on 05/26/2020 with T score -2.3.   PLAN:  1.  CML in chronic phase: - She is taking Dasatinib 20 mg Monday through Friday. - Reviewed labs from 05/09/2022 which showed normal LFTs and creatinine.  CBC shows white count is 2.6 with ANC of 1.3.  Platelet count is normal. - BCR/ABL by quantitative PCR on 05/09/2022 is undetectable. - We will try to contact our pharmacy team to get her grants for co-pay help.  She will continue Dasatinib 20 mg daily Monday through Friday.  RTC 4 months for follow-up with repeat BCR/ABL by PCR.   2.  Right breast DCIS: - Last mammogram was on 02/21/2021 which was normal. - She will have mammogram done on 05/29/2022.   3.  Osteoporosis: - Continue calcium supplements twice daily. - Last dose of Prolia is on 04/03/2022.  She does not want to continue it due to high co-pay. - We will schedule her for DEXA scan on 05/29/2022.   Orders placed this encounter:  No orders of the defined types were placed in this encounter.    Derek Jack, MD [place] [phone]

## 2022-05-23 NOTE — Progress Notes (Signed)
Patient is taking Sprycel and Tamoxifen as prescribed. She has not missed any doses and reports no side effects at this time.

## 2022-05-25 ENCOUNTER — Encounter (HOSPITAL_COMMUNITY): Payer: Self-pay | Admitting: Internal Medicine

## 2022-05-25 ENCOUNTER — Other Ambulatory Visit (HOSPITAL_COMMUNITY): Payer: Self-pay

## 2022-05-25 ENCOUNTER — Telehealth: Payer: Self-pay

## 2022-05-25 NOTE — Telephone Encounter (Signed)
Oral Oncology Patient Advocate Encounter  Prior Authorization for Sprycel has been approved.    PA# [id number]  Effective dates: 05/25/22 through 05/26/23  Patients co-pay is $1,909.14.    Berdine Addison, [facility] Patient [place]  [phone] (phone) [phone] (fax) 05/25/2022 3:04 PM

## 2022-05-25 NOTE — Telephone Encounter (Signed)
Oral Oncology Patient Advocate Encounter   Received notification that prior authorization for Sprycel is required.   PA submitted on 05/25/22  Key [id number]  Status is pending     [name], [place] Patient [place]  [phone] (phone) [phone] (fax) 05/25/2022 2:10 PM

## 2022-05-29 ENCOUNTER — Ambulatory Visit (HOSPITAL_COMMUNITY)
Admission: RE | Admit: 2022-05-29 | Discharge: 2022-05-29 | Disposition: A | Discharge status: Home / Self Care | Payer: PPO | Source: Ambulatory Visit | Attending: Internal Medicine | Admitting: Internal Medicine

## 2022-05-29 ENCOUNTER — Encounter (HOSPITAL_COMMUNITY): Payer: Self-pay

## 2022-05-29 DIAGNOSIS — Z9889 Other specified postprocedural states: Secondary | ICD-10-CM

## 2022-05-29 DIAGNOSIS — Z780 Asymptomatic menopausal state: Secondary | ICD-10-CM | POA: Diagnosis not present

## 2022-05-29 DIAGNOSIS — R92323 Mammographic fibroglandular density, bilateral breasts: Secondary | ICD-10-CM | POA: Diagnosis not present

## 2022-05-29 DIAGNOSIS — M8589 Other specified disorders of bone density and structure, multiple sites: Secondary | ICD-10-CM | POA: Diagnosis not present

## 2022-05-29 DIAGNOSIS — Z853 Personal history of malignant neoplasm of breast: Secondary | ICD-10-CM | POA: Insufficient documentation

## 2022-05-29 DIAGNOSIS — M810 Age-related osteoporosis without current pathological fracture: Secondary | ICD-10-CM | POA: Insufficient documentation

## 2022-05-29 DIAGNOSIS — Z1239 Encounter for other screening for malignant neoplasm of breast: Secondary | ICD-10-CM | POA: Diagnosis not present

## 2022-06-01 ENCOUNTER — Other Ambulatory Visit: Payer: Self-pay

## 2022-06-01 DIAGNOSIS — C9210 Chronic myeloid leukemia, BCR/ABL-positive, not having achieved remission: Secondary | ICD-10-CM

## 2022-06-01 MED ORDER — DASATINIB 20 MG PO TABS: 20.0000 mg | ORAL_TABLET | Freq: Every day | ORAL | 3 refills | Status: DC

## 2022-06-01 NOTE — Telephone Encounter (Signed)
Sprycel refill sent per Dr. Delton Coombes last office note

## 2022-06-01 NOTE — Telephone Encounter (Signed)
Called to check status of application. Informed that application was triaged to [name] for review. They requested we fax a prescription to [phone] so they have that on hand once review is complete. I have notified MD Office and asked to have rx sent. I will continue to follow and update until final determination.   Berdine Addison, [facility] [place]  [phone] (phone) [phone] (fax) 06/01/2022 9:58 AM

## 2022-06-04 ENCOUNTER — Other Ambulatory Visit (HOSPITAL_COMMUNITY): Payer: Self-pay

## 2022-06-05 ENCOUNTER — Other Ambulatory Visit: Payer: Self-pay

## 2022-06-05 DIAGNOSIS — C9210 Chronic myeloid leukemia, BCR/ABL-positive, not having achieved remission: Secondary | ICD-10-CM

## 2022-06-05 MED ORDER — DASATINIB 20 MG PO TABS: 20.0000 mg | ORAL_TABLET | Freq: Every day | ORAL | 3 refills | Status: DC

## 2022-06-05 NOTE — Telephone Encounter (Signed)
Oral Oncology Patient Advocate Encounter   Received notification re-enrollment for assistance for Sprycel through [employer] has been approved. Patient may continue to receive their medication at $0 from this program.    [employer]' phone number [phone].   Effective dates: 06/05/22 through 05/07/23  I have spoken to the patient.  Berdine Addison, [facility] Patient [place]  [phone] (phone) [phone] (fax) 06/05/2022 12:54 PM

## 2022-06-14 ENCOUNTER — Other Ambulatory Visit: Payer: Self-pay | Admitting: Pharmacy Technician

## 2022-06-21 ENCOUNTER — Telehealth: Payer: Self-pay | Admitting: Pharmacy Technician

## 2022-06-21 NOTE — Telephone Encounter (Signed)
Auth Submission: NO AUTH NEEDED Payer: [employer] ADVT Medication & CPT/J Code(s) submitted: Prolia (Denosumab) [id number] Route of submission (phone, fax, portal):  Phone # Fax # Auth type: Buy/Bill Units/visits requested: X2 Reference number: [id number] Approval from: 06/21/22 to 05/07/23

## 2022-06-25 ENCOUNTER — Encounter: Payer: Self-pay | Admitting: Internal Medicine

## 2022-07-05 ENCOUNTER — Encounter: Payer: Self-pay | Admitting: Radiology

## 2022-08-30 ENCOUNTER — Other Ambulatory Visit: Payer: Self-pay

## 2022-09-20 ENCOUNTER — Inpatient Hospital Stay: Payer: PPO | Attending: Hematology

## 2022-09-20 DIAGNOSIS — D0511 Intraductal carcinoma in situ of right breast: Secondary | ICD-10-CM | POA: Insufficient documentation

## 2022-09-20 DIAGNOSIS — M810 Age-related osteoporosis without current pathological fracture: Secondary | ICD-10-CM | POA: Insufficient documentation

## 2022-09-20 DIAGNOSIS — Z79810 Long term (current) use of selective estrogen receptor modulators (SERMs): Secondary | ICD-10-CM | POA: Insufficient documentation

## 2022-09-20 DIAGNOSIS — Z808 Family history of malignant neoplasm of other organs or systems: Secondary | ICD-10-CM | POA: Diagnosis not present

## 2022-09-20 DIAGNOSIS — C9210 Chronic myeloid leukemia, BCR/ABL-positive, not having achieved remission: Secondary | ICD-10-CM | POA: Diagnosis not present

## 2022-09-20 DIAGNOSIS — Z90710 Acquired absence of both cervix and uterus: Secondary | ICD-10-CM | POA: Diagnosis not present

## 2022-09-20 LAB — CBC WITH DIFFERENTIAL/PLATELET
Abs Immature Granulocytes: 0.01 10*3/uL (ref 0.00–0.07)
Basophils Absolute: 0 10*3/uL (ref 0.0–0.1)
Basophils Relative: 1 %
Eosinophils Absolute: 0 10*3/uL (ref 0.0–0.5)
Eosinophils Relative: 1 %
HCT: 40.4 % (ref 36.0–46.0)
Hemoglobin: 13.2 g/dL (ref 12.0–15.0)
Immature Granulocytes: 0 %
Lymphocytes Relative: 47 %
Lymphs Abs: 1.7 10*3/uL (ref 0.7–4.0)
MCH: 30.9 pg (ref 26.0–34.0)
MCHC: 32.7 g/dL (ref 30.0–36.0)
MCV: 94.6 fL (ref 80.0–100.0)
Monocytes Absolute: 0.3 10*3/uL (ref 0.1–1.0)
Monocytes Relative: 7 %
Neutro Abs: 1.6 10*3/uL — ABNORMAL LOW (ref 1.7–7.7)
Neutrophils Relative %: 44 %
Platelets: 192 10*3/uL (ref 150–400)
RBC: 4.27 MIL/uL (ref 3.87–5.11)
RDW: 13.4 % (ref 11.5–15.5)
WBC: 3.6 10*3/uL — ABNORMAL LOW (ref 4.0–10.5)
nRBC: 0 % (ref 0.0–0.2)

## 2022-09-20 LAB — COMPREHENSIVE METABOLIC PANEL
ALT: 17 U/L (ref 0–44)
AST: 24 U/L (ref 15–41)
Albumin: 4.4 g/dL (ref 3.5–5.0)
Alkaline Phosphatase: 28 U/L — ABNORMAL LOW (ref 38–126)
Anion gap: 8 (ref 5–15)
BUN: 16 mg/dL (ref 8–23)
CO2: 27 mmol/L (ref 22–32)
Calcium: 9.6 mg/dL (ref 8.9–10.3)
Chloride: 104 mmol/L (ref 98–111)
Creatinine, Ser: 0.88 mg/dL (ref 0.44–1.00)
GFR, Estimated: >60 mL/min (ref >60)
Glucose, Bld: 108 mg/dL — ABNORMAL HIGH (ref 70–99)
Potassium: 4.1 mmol/L (ref 3.5–5.1)
Sodium: 139 mmol/L (ref 135–145)
Total Bilirubin: 1.3 mg/dL — ABNORMAL HIGH (ref 0.3–1.2)
Total Protein: 7.1 g/dL (ref 6.5–8.1)

## 2022-09-26 LAB — BCR-ABL1, CML/ALL, PCR, QUANT: b2a2 transcript: 0.0292 %

## 2022-10-02 DIAGNOSIS — R7301 Impaired fasting glucose: Secondary | ICD-10-CM | POA: Diagnosis not present

## 2022-10-02 DIAGNOSIS — E782 Mixed hyperlipidemia: Secondary | ICD-10-CM | POA: Diagnosis not present

## 2022-10-03 NOTE — Progress Notes (Signed)
[facility] [address] [place] [address]    Clinic Day:  10/04/2022  Referring physician: Benita Stabile, MD  Patient Care Team: Benita Stabile, MD as PCP - General (Internal Medicine) Jena Gauss Gerrit Friends, MD (Gastroenterology) Doreatha Massed, MD as Medical Oncologist (Medical Oncology)   ASSESSMENT & PLAN:   Assessment: 1.  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  BCR/ABL by FISH on 02/23/2020 was positive for fusion protein. 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 -Chromosome analysis confirms Philadelphia chromosome. -BCR/ABL by quantitative PCR with B2 A2 transcript 132% -Dasatinib 50 mg daily started on 03/13/2020. - Sprycel dose reduced to 20 mg daily on 05/19/2020 secondary to leukopenia/neutropenia. - Sprycel dose changed to 20 mg daily Monday through Friday due to tiredness and dizziness on 06/05/2021.   2.  Right breast DCIS: - Screening mammogram on 02/03/2018 was abnormal. -Ultrasound of the right breast on 02/11/2018 shows mass measuring 0.5 x 0.4 x 0.5 cm, circumscribed hypoechoic in the 3:30 o'clock location of the right breast.  Right axilla ultrasound was negative for adenopathy. - Right breast core biopsy on 02/18/2018 shows ductal carcinoma with papillary configuration.  In the comment section, it notes that this may represent DCIS involving a papillary lesion or it may represent an intracystic papillary ductal carcinoma.  ER/PR was 95% positive. - Right breast lumpectomy on 03/12/2018, pathology showing intermediate grade DCIS, 0.6 cm, with areas showing papillary configuration.  Posterior margin was 0.2 cm.  No evidence of invasion. - Radiation therapy was refused. -Tamoxifen for 5 years started on 04/09/2018.   3.  Osteoporosis: -DEXA scan on 04/11/2018 with T score of -2.9. -DEXA scan on 05/26/2020 with T score -2.3.    Plan: 1.  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 - She has occasional tiredness.  She reports not missing any doses. - No GI side effects noted. - Physical exam: Lungs are clear.  No palpable splenomegaly or hepatomegaly. - Labs from 09/20/2022: LFTs are normal.  CBC with white count 3.6 and ANC 1.6. - BCR/ABL by quantitative PCR: B2 A2 transcript 0.0292.  Previously negative.  B3 A2 and even A2 transcripts were undetectable. - She had difficulty tolerating Dasatinib 20 mg daily due to tiredness.  Hence I would continue same dose Monday through Friday and repeat BCR/ABL by quantitative PCR.  If it continues to trend up, will do kinase domain mutation testing.   2.  Right breast DCIS: - Reviewed mammogram from 05/29/2022: BI-RADS Category 2.  Continue yearly mammograms.   3.  Osteoporosis: - Last dose of Prolia was on 04/03/2022.  She does not want to continue it at our center due to high co-pay. - Reviewed DEXA scan from 05/29/2022 with T-score improved to -2.2.  Previously -2.9. - Continue calcium supplements. - She will continue Prolia with Dr. Scharlene Gloss office.    Orders Placed This Encounter  Procedures   BCR-ABL1, CML/ALL, PCR, QUANT    Standing Status:   Future    Standing Expiration Date:   10/04/2023   CBC with Differential/Platelet    Standing Status:   Future    Standing Expiration Date:   10/04/2023    Order Specific Question:   Release to patient    Answer:   Immediate   Comprehensive metabolic panel    Standing Status:   Future    Standing Expiration Date:   10/04/2023  Order Specific Question:   Release to patient    Answer:   Immediate      I,Katie Daubenspeck,acting as a scribe for Doreatha Massed, MD.,have documented all relevant documentation on the behalf of Doreatha Massed, MD,as directed by  Doreatha Massed, MD while in the presence of Doreatha Massed, MD.   I, Doreatha Massed MD, have reviewed the above documentation for accuracy and completeness, and I agree with  the above.   Doreatha Massed, MD   5/30/20244:45 PM  CHIEF COMPLAINT:   Diagnosis: CML and right breast DCIS    Cancer Staging  No matching staging information was found for the patient.   Prior Therapy: right lumpectomy, 03/12/18  Current Therapy:  Tamoxifen 20 mg daily and Dasatinib 20 mg daily    HISTORY OF PRESENT ILLNESS:   Oncology History   No history exists.     INTERVAL HISTORY:   Da is a 88 y.o. female presenting to clinic today for follow up of CML and right breast DCIS. She was last seen by me on 05/23/22.  Since her last visit, she underwent annual screening mammogram on 05/29/22 showing no evidence of malignancy.  Of note, she also underwent DEXA scan the same day showing osteopenia with T-score of -2.2.  Today, she states that she is doing well overall. Her appetite level is at 75%. Her energy level is at 90%.  PAST MEDICAL HISTORY:   Past Medical History: Past Medical History:  Diagnosis Date   Bladder infection, chronic    Cancer (HCC) 02/2018   right breast cancer   Complication of anesthesia    GERD (gastroesophageal reflux disease)    Hemorrhoids    Hypercholesterolemia    Polymyalgia rheumatica (HCC)    PONV (postoperative nausea and vomiting)    S/P colonoscopy Jun 14, 2004   friable internal and external hemorrhoids, left-sided diverticula   S/P endoscopy August 13, 2006   pale 1-3 mm nodules consistent with benign squamous papilloma, tiny hiatal hernia   Serrated adenoma of colon     Surgical History: Past Surgical History:  Procedure Laterality Date   ABDOMINAL HYSTERECTOMY     BREAST LUMPECTOMY WITH RADIOACTIVE SEED LOCALIZATION Right 03/12/2018   Procedure: RIGHT BREAST LUMPECTOMY WITH RADIOACTIVE SEED LOCALIZATION;  Surgeon: Claud Kelp, MD;  Location: [facility];  Service: General;  Laterality: Right;   CHOLECYSTECTOMY     COLONOSCOPY  11/20/2010   Dr. Jena Gauss- serrated adenomaL side diverticulosis,  hemorrhoids   COLONOSCOPY  06/14/04   friable internal and external hemorrhoids, left-sided diverticula   COLONOSCOPY N/A 08/23/2016   Dr. Jena Gauss: Diverticulosis, medium-sized grade 2 internal hemorrhoids.   ESOPHAGOGASTRODUODENOSCOPY  08/13/06   pale 1-3 mm nodules consistent with benign squamous papilloma, tiny hiatal hernia   ESOPHAGOGASTRODUODENOSCOPY N/A 08/23/2016   Dr. Jena Gauss: Normal   FLEXIBLE SIGMOIDOSCOPY  11/15/2011   Procedure: FLEXIBLE SIGMOIDOSCOPY;  Surgeon: Corbin Ade, MD;  Location: [facility];  Service: Endoscopy;  Laterality: N/A;  12:30PM   KNEE ARTHROSCOPY WITH LATERAL MENISECTOMY Right 03/13/2022   Procedure: KNEE ARTHROSCOPY WITH LATERAL MENISCECTOMY;  Surgeon: Vickki Hearing, MD;  Location: [facility];  Service: Orthopedics;  Laterality: Right;   WRIST FRACTURE SURGERY Right     Social History: Social History   Socioeconomic History   Marital status: Widowed    Spouse name: Not on file   Number of children: 2   Years of education: Not on file   Highest education level: Not on file  Occupational History  Not on file  Tobacco Use   Smoking status: Never   Smokeless tobacco: Never  Vaping Use   Vaping Use: Never used  Substance and Sexual Activity   Alcohol use: Yes    Alcohol/week: 2.0 standard drinks of alcohol    Types: 2 Glasses of wine per week    Comment: Drinks wine 2-3 days per week ( usually a couple of glasses each time)   Drug use: No   Sexual activity: Not on file  Other Topics Concern   Not on file  Social History Narrative   Not on file   Social Determinants of Health   Financial Resource Strain: Low Risk  (03/08/2020)   Overall Financial Resource Strain (CARDIA)    Difficulty of Paying Living Expenses: Not hard at all  Food Insecurity: No Food Insecurity (03/08/2020)   Hunger Vital Sign    Worried About Running Out of Food in the Last Year: Never true    Ran Out of Food in the Last Year: Never true  Transportation Needs: No  Transportation Needs (03/08/2020)   PRAPARE - Administrator, Civil Service (Medical): No    Lack of Transportation (Non-Medical): No  Physical Activity: Insufficiently Active (03/08/2020)   Exercise Vital Sign    Days of Exercise per Week: 7 days    Minutes of Exercise per Session: 20 min  Stress: No Stress Concern Present (03/08/2020)   [employer] of Occupational Health - Occupational Stress Questionnaire    Feeling of Stress : Not at all  Social Connections: Moderately Isolated (03/08/2020)   Social Connection and Isolation Panel [NHANES]    Frequency of Communication with Friends and Family: Twice a week    Frequency of Social Gatherings with Friends and Family: Twice a week    Attends Religious Services: More than 4 times per year    Active Member of [employer] or Organizations: No    Attends Banker Meetings: Never    Marital Status: Widowed  Intimate Partner Violence: Not At Risk (03/08/2020)   Humiliation, Afraid, Rape, and Kick questionnaire    Fear of Current or Ex-Partner: No    Emotionally Abused: No    Physically Abused: No    Sexually Abused: No    Family History: Family History  Problem Relation Age of Onset   Thyroid cancer Mother    Parkinson's disease Sister    Thyroid disease Brother    Aneurysm Sister    Thyroid cancer Other    Thyroid cancer Other    Colon cancer Neg Hx     Current Medications:  Current Outpatient Medications:    acetaminophen (TYLENOL) 500 MG tablet, Take 500-1,000 mg by mouth every 6 (six) hours as needed (pain.)., Disp: , Rfl:    Calcium-Magnesium-Vitamin D (CALCIUM 1200+D3 PO), Take 1 tablet by mouth in the morning., Disp: , Rfl:    Magnesium 500 MG TABS, Take 500 mg by mouth in the morning., Disp: , Rfl:    Multiple Vitamin (MULTIVITAMIN WITH MINERALS) TABS tablet, Take 1 tablet by mouth in the morning. Centrum Silver, Disp: , Rfl:    polyethylene glycol (MIRALAX / GLYCOLAX) packet, Take 17 g by mouth in the  morning., Disp: , Rfl:    tamoxifen (NOLVADEX) 20 MG tablet, Take 1 tablet (20 mg total) by mouth daily., Disp: 90 tablet, Rfl: 6   Wheat Dextrin (BENEFIBER) POWD, Take 1 Dose by mouth daily., Disp: , Rfl:    [START ON 10/05/2022] dasatinib (SPRYCEL) 20 MG  tablet, Take one tablet daily Monday thru friday, Disp: 20 tablet, Rfl: 3   Allergies: Allergies  Allergen Reactions   Fish Allergy Nausea And Vomiting   Shrimp [Shellfish Allergy] Nausea And Vomiting    REVIEW OF SYSTEMS:   Review of Systems  Constitutional:  Negative for chills, fatigue and fever.  HENT:   Negative for lump/mass, mouth sores, nosebleeds, sore throat and trouble swallowing.   Eyes:  Negative for eye problems.  Respiratory:  Negative for cough and shortness of breath.   Cardiovascular:  Negative for chest pain, leg swelling and palpitations.  Gastrointestinal:  Negative for abdominal pain, constipation, diarrhea, nausea and vomiting.  Genitourinary:  Positive for bladder incontinence. Negative for difficulty urinating, dysuria, frequency, hematuria and nocturia.   Musculoskeletal:  Negative for arthralgias, back pain, flank pain, myalgias and neck pain.  Skin:  Negative for itching and rash.  Neurological:  Positive for dizziness. Negative for headaches and numbness.  Hematological:  Does not bruise/bleed easily.  Psychiatric/Behavioral:  Positive for sleep disturbance. Negative for depression and suicidal ideas. The patient is not nervous/anxious.   All other systems reviewed and are negative.    VITALS:   Blood pressure 138/62, pulse 75, temperature 98.1 F (36.7 C), temperature source Oral, resp. rate 17, height 5\' 8"  (1.727 m), weight 143 lb 9.6 oz (65.1 kg), SpO2 98 %.  Wt Readings from Last 3 Encounters:  10/04/22 143 lb 9.6 oz (65.1 kg)  05/23/22 145 lb 1 oz (65.8 kg)  03/09/22 143 lb 15.4 oz (65.3 kg)    Body mass index is 21.83 kg/m.  Performance status (ECOG): 1 - Symptomatic but completely  ambulatory  PHYSICAL EXAM:   Physical Exam Vitals and nursing note reviewed. Exam conducted with a chaperone present.  Constitutional:      Appearance: Normal appearance.  Cardiovascular:     Rate and Rhythm: Normal rate and regular rhythm.     Pulses: Normal pulses.     Heart sounds: Normal heart sounds.  Pulmonary:     Effort: Pulmonary effort is normal.     Breath sounds: Normal breath sounds.  Abdominal:     Palpations: Abdomen is soft. There is no hepatomegaly, splenomegaly or mass.     Tenderness: There is no abdominal tenderness.  Musculoskeletal:     Right lower leg: No edema.     Left lower leg: No edema.  Lymphadenopathy:     Cervical: No cervical adenopathy.     Right cervical: No superficial, deep or posterior cervical adenopathy.    Left cervical: No superficial, deep or posterior cervical adenopathy.     Upper Body:     Right upper body: No supraclavicular or axillary adenopathy.     Left upper body: No supraclavicular or axillary adenopathy.  Neurological:     General: No focal deficit present.     Mental Status: She is alert and oriented to person, place, and time.  Psychiatric:        Mood and Affect: Mood normal.        Behavior: Behavior normal.     LABS:      Latest Ref Rng & Units 09/20/2022   12:45 PM 05/09/2022    8:57 AM 03/09/2022   12:53 PM  CBC  WBC 4.0 - 10.5 K/uL 3.6  2.6  3.8   Hemoglobin 12.0 - 15.0 g/dL 16.1  09.6  04.5   Hematocrit 36.0 - 46.0 % 40.4  37.1  34.6   Platelets 150 - 400 K/uL  192  189  169       Latest Ref Rng & Units 09/20/2022   12:45 PM 05/09/2022    8:57 AM 03/09/2022   12:53 PM  CMP  Glucose 70 - 99 mg/dL 098  119  93   BUN 8 - 23 mg/dL 16  14  25    Creatinine 0.44 - 1.00 mg/dL 1.47  8.29  5.62   Sodium 135 - 145 mmol/L 139  139  136   Potassium 3.5 - 5.1 mmol/L 4.1  3.9  4.0   Chloride 98 - 111 mmol/L 104  107  105   CO2 22 - 32 mmol/L 27  25  23    Calcium 8.9 - 10.3 mg/dL 9.6  8.6  8.3   Total Protein 6.5 -  8.1 g/dL 7.1  6.8    Total Bilirubin 0.3 - 1.2 mg/dL 1.3  1.0    Alkaline Phos 38 - 126 U/L 28  26    AST 15 - 41 U/L 24  25    ALT 0 - 44 U/L 17  18       No results found for: "CEA1", "CEA" / No results found for: "CEA1", "CEA" No results found for: "PSA1" No results found for: "ZHY865" No results found for: "CAN125"  No results found for: "TOTALPROTELP", "ALBUMINELP", "A1GS", "A2GS", "BETS", "BETA2SER", "GAMS", "MSPIKE", "SPEI" No results found for: "TIBC", "FERRITIN", "IRONPCTSAT" Lab Results  Component Value Date   LDH 161 05/22/2021   LDH 166 02/21/2021   LDH 148 11/09/2020     STUDIES:   No results found.

## 2022-10-04 ENCOUNTER — Inpatient Hospital Stay (HOSPITAL_BASED_OUTPATIENT_CLINIC_OR_DEPARTMENT_OTHER): Payer: PPO | Admitting: Hematology

## 2022-10-04 DIAGNOSIS — C9210 Chronic myeloid leukemia, BCR/ABL-positive, not having achieved remission: Secondary | ICD-10-CM | POA: Diagnosis not present

## 2022-10-04 DIAGNOSIS — Z0000 Encounter for general adult medical examination without abnormal findings: Secondary | ICD-10-CM | POA: Diagnosis not present

## 2022-10-04 MED ORDER — DASATINIB 20 MG PO TABS
ORAL_TABLET | ORAL | 3 refills | Status: DC
Start: 2022-10-05 — End: 2023-01-14

## 2022-10-04 MED ORDER — DASATINIB 20 MG PO TABS: ORAL_TABLET | ORAL | 3 refills | Status: DC

## 2022-10-04 NOTE — Progress Notes (Signed)
Patient has been assessed, vital signs and labs have been reviewed by Dr. Ellin Saba. ANC, Creatinine, LFTs, and Platelets are within treatment parameters per Dr. Ellin Saba. The patient is not going to proceed with Prolia treatment at this time with our office.  Primary RN and pharmacy aware. She will start treatments with her PCP.

## 2022-10-04 NOTE — Patient Instructions (Addendum)
[facility]  Discharge Instructions  You were seen and examined today by Dr. Ellin Saba.  Dr. Ellin Saba discussed your most recent lab work which revealed that everything looks stable.  Start taking the Sprycel 20 mg only Monday- Friday and not on the weekends.  Follow-up as scheduled in 3 months.    Thank you for choosing [facility] to provide your oncology and hematology care.   To afford each patient quality time with our provider, please arrive at least 15 minutes before your scheduled appointment time. You may need to reschedule your appointment if you arrive late (10 or more minutes). Arriving late affects you and other patients whose appointments are after yours.  Also, if you miss three or more appointments without notifying the office, you may be dismissed from the clinic at the provider's discretion.    Again, thank you for choosing [facility].  Our hope is that these requests will decrease the amount of time that you wait before being seen by our physicians.   If you have a lab appointment with the Cancer Center - please note that after April 8th, all labs will be drawn in the cancer center.  You do not have to check in or register with the main entrance as you have in the past but will complete your check-in at the cancer center.            _____________________________________________________________  Should you have questions after your visit to [facility], please contact our office at [phone] and follow the prompts.  Our office hours are 8:00 a.m. to 4:30 p.m. Monday - Thursday and 8:00 a.m. to 2:30 p.m. Friday.  Please note that voicemails left after 4:00 p.m. may not be returned until the following business day.  We are closed weekends and all major holidays.  You do have access to a nurse 24-7, just call the main number to the clinic [phone] and do not press any options, hold on  the line and a nurse will answer the phone.    For prescription refill requests, have your pharmacy contact our office and allow 72 hours.    Masks are no longer required in the cancer centers. If you would like for your care team to wear a mask while they are taking care of you, please let them know. You may have one support person who is at least 86 years old accompany you for your appointments.

## 2022-10-08 DIAGNOSIS — R944 Abnormal results of kidney function studies: Secondary | ICD-10-CM | POA: Diagnosis not present

## 2022-10-08 DIAGNOSIS — S83281D Other tear of lateral meniscus, current injury, right knee, subsequent encounter: Secondary | ICD-10-CM | POA: Diagnosis not present

## 2022-10-08 DIAGNOSIS — Z713 Dietary counseling and surveillance: Secondary | ICD-10-CM | POA: Diagnosis not present

## 2022-10-08 DIAGNOSIS — E782 Mixed hyperlipidemia: Secondary | ICD-10-CM | POA: Diagnosis not present

## 2022-10-08 DIAGNOSIS — Z7182 Exercise counseling: Secondary | ICD-10-CM | POA: Diagnosis not present

## 2022-10-08 DIAGNOSIS — C9210 Chronic myeloid leukemia, BCR/ABL-positive, not having achieved remission: Secondary | ICD-10-CM | POA: Diagnosis not present

## 2022-10-08 DIAGNOSIS — R809 Proteinuria, unspecified: Secondary | ICD-10-CM | POA: Diagnosis not present

## 2022-10-08 DIAGNOSIS — M810 Age-related osteoporosis without current pathological fracture: Secondary | ICD-10-CM | POA: Diagnosis not present

## 2022-10-08 DIAGNOSIS — R7301 Impaired fasting glucose: Secondary | ICD-10-CM | POA: Diagnosis not present

## 2022-10-08 DIAGNOSIS — Z6822 Body mass index (BMI) 22.0-22.9, adult: Secondary | ICD-10-CM | POA: Diagnosis not present

## 2022-10-08 DIAGNOSIS — C50919 Malignant neoplasm of unspecified site of unspecified female breast: Secondary | ICD-10-CM | POA: Diagnosis not present

## 2022-10-08 DIAGNOSIS — M791 Myalgia, unspecified site: Secondary | ICD-10-CM | POA: Diagnosis not present

## 2022-10-09 ENCOUNTER — Telehealth: Payer: Self-pay

## 2022-10-09 ENCOUNTER — Encounter (HOSPITAL_COMMUNITY)
Admission: RE | Admit: 2022-10-09 | Discharge: 2022-10-09 | Disposition: A | Discharge status: Home / Self Care | Payer: PPO | Source: Ambulatory Visit | Attending: Internal Medicine | Admitting: Internal Medicine

## 2022-10-09 DIAGNOSIS — M810 Age-related osteoporosis without current pathological fracture: Secondary | ICD-10-CM | POA: Insufficient documentation

## 2022-10-09 NOTE — Telephone Encounter (Signed)
Called patient to reschedule missed appointment this morning. No answer. Left HIPAA-compliant voicemail requesting call back. Will also send MyChart message.  Dewell Monnier E Alesi Zachery, RN  

## 2022-11-21 ENCOUNTER — Other Ambulatory Visit: Payer: Self-pay

## 2023-01-11 ENCOUNTER — Inpatient Hospital Stay: Payer: PPO | Attending: Hematology

## 2023-01-11 DIAGNOSIS — C9210 Chronic myeloid leukemia, BCR/ABL-positive, not having achieved remission: Secondary | ICD-10-CM | POA: Diagnosis not present

## 2023-01-11 DIAGNOSIS — D0511 Intraductal carcinoma in situ of right breast: Secondary | ICD-10-CM | POA: Diagnosis not present

## 2023-01-11 DIAGNOSIS — M810 Age-related osteoporosis without current pathological fracture: Secondary | ICD-10-CM | POA: Insufficient documentation

## 2023-01-11 DIAGNOSIS — Z79810 Long term (current) use of selective estrogen receptor modulators (SERMs): Secondary | ICD-10-CM | POA: Diagnosis present

## 2023-01-11 LAB — CBC WITH DIFFERENTIAL/PLATELET
Abs Immature Granulocytes: 0.01 10*3/uL (ref 0.00–0.07)
Basophils Absolute: 0 10*3/uL (ref 0.0–0.1)
Basophils Relative: 1 %
Eosinophils Absolute: 0.1 10*3/uL (ref 0.0–0.5)
Eosinophils Relative: 2 %
HCT: 39 % (ref 36.0–46.0)
Hemoglobin: 12.7 g/dL (ref 12.0–15.0)
Immature Granulocytes: 0 %
Lymphocytes Relative: 43 %
Lymphs Abs: 1.5 10*3/uL (ref 0.7–4.0)
MCH: 31.7 pg (ref 26.0–34.0)
MCHC: 32.6 g/dL (ref 30.0–36.0)
MCV: 97.3 fL (ref 80.0–100.0)
Monocytes Absolute: 0.3 10*3/uL (ref 0.1–1.0)
Monocytes Relative: 7 %
Neutro Abs: 1.6 10*3/uL — ABNORMAL LOW (ref 1.7–7.7)
Neutrophils Relative %: 47 %
Platelets: 161 10*3/uL (ref 150–400)
RBC: 4.01 MIL/uL (ref 3.87–5.11)
RDW: 13 % (ref 11.5–15.5)
WBC: 3.4 10*3/uL — ABNORMAL LOW (ref 4.0–10.5)
nRBC: 0 % (ref 0.0–0.2)

## 2023-01-11 LAB — COMPREHENSIVE METABOLIC PANEL
ALT: 15 U/L (ref 0–44)
AST: 21 U/L (ref 15–41)
Albumin: 4.1 g/dL (ref 3.5–5.0)
Alkaline Phosphatase: 26 U/L — ABNORMAL LOW (ref 38–126)
Anion gap: 10 (ref 5–15)
BUN: 18 mg/dL (ref 8–23)
CO2: 24 mmol/L (ref 22–32)
Calcium: 8.7 mg/dL — ABNORMAL LOW (ref 8.9–10.3)
Chloride: 103 mmol/L (ref 98–111)
Creatinine, Ser: 0.95 mg/dL (ref 0.44–1.00)
GFR, Estimated: 59 mL/min — ABNORMAL LOW (ref >60)
Glucose, Bld: 107 mg/dL — ABNORMAL HIGH (ref 70–99)
Potassium: 3.7 mmol/L (ref 3.5–5.1)
Sodium: 137 mmol/L (ref 135–145)
Total Bilirubin: 1.2 mg/dL (ref 0.3–1.2)
Total Protein: 6.7 g/dL (ref 6.5–8.1)

## 2023-01-14 ENCOUNTER — Other Ambulatory Visit: Payer: Self-pay | Admitting: *Deleted

## 2023-01-14 DIAGNOSIS — C9210 Chronic myeloid leukemia, BCR/ABL-positive, not having achieved remission: Secondary | ICD-10-CM

## 2023-01-14 MED ORDER — DASATINIB 20 MG PO TABS: ORAL_TABLET | ORAL | 3 refills | Status: DC

## 2023-01-20 NOTE — Progress Notes (Signed)
[facility] [address] [place] [address]    Clinic Day:  01/21/2023  Referring physician: Benita Stabile, MD  Patient Care Team: Benita Stabile, MD as PCP - General (Internal Medicine) Jena Gauss Gerrit Friends, MD (Gastroenterology) Doreatha Massed, MD as Medical Oncologist (Medical Oncology)   ASSESSMENT & PLAN:   Assessment: 1.  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  BCR/ABL by FISH on 02/23/2020 was positive for fusion protein. 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 -Chromosome analysis confirms Philadelphia chromosome. -BCR/ABL by quantitative PCR with B2 A2 transcript 132% -Dasatinib 50 mg daily started on 03/13/2020. - Sprycel dose reduced to 20 mg daily on 05/19/2020 secondary to leukopenia/neutropenia. - Sprycel dose changed to 20 mg daily Monday through Friday due to tiredness and dizziness on 06/05/2021.   2.  Right breast DCIS: - Screening mammogram on 02/03/2018 was abnormal. -Ultrasound of the right breast on 02/11/2018 shows mass measuring 0.5 x 0.4 x 0.5 cm, circumscribed hypoechoic in the 3:30 o'clock location of the right breast.  Right axilla ultrasound was negative for adenopathy. - Right breast core biopsy on 02/18/2018 shows ductal carcinoma with papillary configuration.  In the comment section, it notes that this may represent DCIS involving a papillary lesion or it may represent an intracystic papillary ductal carcinoma.  ER/PR was 95% positive. - Right breast lumpectomy on 03/12/2018, pathology showing intermediate grade DCIS, 0.6 cm, with areas showing papillary configuration.  Posterior margin was 0.2 cm.  No evidence of invasion. - Radiation therapy was refused. -Tamoxifen for 5 years started on 04/09/2018.   3.  Osteoporosis: -DEXA scan on 04/11/2018 with T score of -2.9. -DEXA scan on 05/26/2020 with T score -2.3.    Plan: 1.  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 - She denies any B symptoms. - Physical exam: No palpable splenomegaly.  Lungs are clear to auscultation. - Reviewed labs from 01/11/2023: WBC 3.4, ANC 1.6.  Platelet count and hemoglobin normal.  LFTs are normal. - BCR/ABL by PCR is pending.  Previous test on 09/20/2022 showed B2 A2 transcript 0.0292, previously negative.  Other 2 transcripts are undetectable. - She had difficulty tolerating Dasatinib 20 mg daily due to tiredness. - We will follow-up on the PCR results.  If it continues to go high, we will do kinase domain mutation testing and consider switching treatments.   2.  Right breast DCIS: - Mammogram on 05/29/2022: BI-RADS Category 2. - She stopped taking tamoxifen.  I have recommended that she restart tamoxifen and continue until December 2024 to complete 5 years. - Continue yearly mammograms.   3.  Osteoporosis: - DEXA scan on 05/29/2022 with T-score -2.2, previously -2.9. - Continue Prolia at Dr. Scharlene Gloss office.    Orders Placed This Encounter  Procedures   CBC with Differential    Standing Status:   Future    Standing Expiration Date:   01/21/2024   Comprehensive metabolic panel    Standing Status:   Future    Standing Expiration Date:   01/21/2024   BCR-ABL1, CML/ALL, PCR, QUANT    Standing Status:   Future    Standing Expiration Date:   01/21/2024      I,Katie Daubenspeck,acting as a scribe for Doreatha Massed, MD.,have documented all relevant documentation on the behalf of Doreatha Massed, MD,as directed by  Doreatha Massed, MD while in the presence of Doreatha Massed, MD.  I, Doreatha Massed MD, have reviewed the above documentation for accuracy and completeness, and I agree with the above.   Doreatha Massed, MD   9/16/20245:08 PM  CHIEF COMPLAINT:   Diagnosis: CML and right breast DCIS    Cancer Staging  No matching staging information was found for the patient.    Prior Therapy: right lumpectomy, 03/12/18    Current Therapy:  Tamoxifen 20 mg daily and Dasatinib 20 mg daily    HISTORY OF PRESENT ILLNESS:   Oncology History   No history exists.     INTERVAL HISTORY:   [name] is a 88 y.o. female presenting to clinic today for follow up of CML and right breast DCIS. She was last seen by me on 10/04/22.  Today, she states that she is doing well overall. Her appetite level is at 80%. Her energy level is at 80%.  PAST MEDICAL HISTORY:   Past Medical History: Past Medical History:  Diagnosis Date   Bladder infection, chronic    Cancer (HCC) 02/2018   right breast cancer   Complication of anesthesia    GERD (gastroesophageal reflux disease)    Hemorrhoids    Hypercholesterolemia    Polymyalgia rheumatica (HCC)    PONV (postoperative nausea and vomiting)    S/P colonoscopy Jun 14, 2004   friable internal and external hemorrhoids, left-sided diverticula   S/P endoscopy August 13, 2006   pale 1-3 mm nodules consistent with benign squamous papilloma, tiny hiatal hernia   Serrated adenoma of colon     Surgical History: Past Surgical History:  Procedure Laterality Date   ABDOMINAL HYSTERECTOMY     BREAST LUMPECTOMY WITH RADIOACTIVE SEED LOCALIZATION Right 03/12/2018   Procedure: RIGHT BREAST LUMPECTOMY WITH RADIOACTIVE SEED LOCALIZATION;  Surgeon: Claud Kelp, MD;  Location: [facility];  Service: General;  Laterality: Right;   CHOLECYSTECTOMY     COLONOSCOPY  11/20/2010   Dr. Jena Gauss- serrated adenomaL side diverticulosis, hemorrhoids   COLONOSCOPY  06/14/04   friable internal and external hemorrhoids, left-sided diverticula   COLONOSCOPY N/A 08/23/2016   Dr. Jena Gauss: Diverticulosis, medium-sized grade 2 internal hemorrhoids.   ESOPHAGOGASTRODUODENOSCOPY  08/13/06   pale 1-3 mm nodules consistent with benign squamous papilloma, tiny hiatal hernia   ESOPHAGOGASTRODUODENOSCOPY N/A 08/23/2016   Dr. Jena Gauss: Normal   FLEXIBLE SIGMOIDOSCOPY  11/15/2011   Procedure: FLEXIBLE  SIGMOIDOSCOPY;  Surgeon: Corbin Ade, MD;  Location: [facility];  Service: Endoscopy;  Laterality: N/A;  12:30PM   KNEE ARTHROSCOPY WITH LATERAL MENISECTOMY Right 03/13/2022   Procedure: KNEE ARTHROSCOPY WITH LATERAL MENISCECTOMY;  Surgeon: Vickki Hearing, MD;  Location: [facility];  Service: Orthopedics;  Laterality: Right;   WRIST FRACTURE SURGERY Right     Social History: Social History   Socioeconomic History   Marital status: Widowed    Spouse name: Not on file   Number of children: 2   Years of education: Not on file   Highest education level: Not on file  Occupational History   Not on file  Tobacco Use   Smoking status: Never   Smokeless tobacco: Never  Vaping Use   Vaping status: Never Used  Substance and Sexual Activity   Alcohol use: Yes    Alcohol/week: 2.0 standard drinks of alcohol    Types: 2 Glasses of wine per week    Comment: Drinks wine 2-3 days per week ( usually a couple of glasses each time)   Drug use: No   Sexual activity: Not on file  Other  Topics Concern   Not on file  Social History Narrative   Not on file   Social Determinants of Health   Financial Resource Strain: Low Risk  (03/08/2020)   Overall Financial Resource Strain (CARDIA)    Difficulty of Paying Living Expenses: Not hard at all  Food Insecurity: No Food Insecurity (03/08/2020)   Hunger Vital Sign    Worried About Running Out of Food in the Last Year: Never true    Ran Out of Food in the Last Year: Never true  Transportation Needs: No Transportation Needs (03/08/2020)   PRAPARE - Administrator, Civil Service (Medical): No    Lack of Transportation (Non-Medical): No  Physical Activity: Insufficiently Active (03/08/2020)   Exercise Vital Sign    Days of Exercise per Week: 7 days    Minutes of Exercise per Session: 20 min  Stress: No Stress Concern Present (03/08/2020)   [employer] of Occupational Health - Occupational Stress Questionnaire    Feeling of Stress :  Not at all  Social Connections: Moderately Isolated (03/08/2020)   Social Connection and Isolation Panel [NHANES]    Frequency of Communication with Friends and Family: Twice a week    Frequency of Social Gatherings with Friends and Family: Twice a week    Attends Religious Services: More than 4 times per year    Active Member of [employer] or Organizations: No    Attends Banker Meetings: Never    Marital Status: Widowed  Intimate Partner Violence: Not At Risk (03/08/2020)   Humiliation, Afraid, Rape, and Kick questionnaire    Fear of Current or Ex-Partner: No    Emotionally Abused: No    Physically Abused: No    Sexually Abused: No    Family History: Family History  Problem Relation Age of Onset   Thyroid cancer Mother    Parkinson's disease Sister    Thyroid disease Brother    Aneurysm Sister    Thyroid cancer Other    Thyroid cancer Other    Colon cancer Neg Hx     Current Medications:  Current Outpatient Medications:    acetaminophen (TYLENOL) 500 MG tablet, Take 500-1,000 mg by mouth every 6 (six) hours as needed (pain.)., Disp: , Rfl:    Calcium-Magnesium-Vitamin D (CALCIUM 1200+D3 PO), Take 1 tablet by mouth in the morning., Disp: , Rfl:    dasatinib (SPRYCEL) 20 MG tablet, Take one tablet daily Monday thru friday, Disp: 20 tablet, Rfl: 3   Magnesium 500 MG TABS, Take 500 mg by mouth in the morning., Disp: , Rfl:    Multiple Vitamin (MULTIVITAMIN WITH MINERALS) TABS tablet, Take 1 tablet by mouth in the morning. Centrum Silver, Disp: , Rfl:    polyethylene glycol (MIRALAX / GLYCOLAX) packet, Take 17 g by mouth in the morning., Disp: , Rfl:    Wheat Dextrin (BENEFIBER) POWD, Take 1 Dose by mouth daily., Disp: , Rfl:    tamoxifen (NOLVADEX) 20 MG tablet, Take 1 tablet (20 mg total) by mouth daily., Disp: 90 tablet, Rfl: 6   Allergies: Allergies  Allergen Reactions   Fish Allergy Nausea And Vomiting   Shrimp [Shellfish Allergy] Nausea And Vomiting    REVIEW  OF SYSTEMS:   Review of Systems  Constitutional:  Negative for chills, fatigue and fever.  HENT:   Negative for lump/mass, mouth sores, nosebleeds, sore throat and trouble swallowing.   Eyes:  Negative for eye problems.  Respiratory:  Negative for cough and shortness of breath.  Cardiovascular:  Negative for chest pain, leg swelling and palpitations.  Gastrointestinal:  Negative for abdominal pain, constipation, diarrhea, nausea and vomiting.  Genitourinary:  Negative for bladder incontinence, difficulty urinating, dysuria, frequency, hematuria and nocturia.   Musculoskeletal:  Negative for arthralgias, back pain, flank pain, myalgias and neck pain.  Skin:  Negative for itching and rash.  Neurological:  Positive for dizziness. Negative for headaches and numbness.  Hematological:  Does not bruise/bleed easily.  Psychiatric/Behavioral:  Negative for depression, sleep disturbance and suicidal ideas. The patient is not nervous/anxious.   All other systems reviewed and are negative.    VITALS:   Blood pressure (!) 152/66, pulse 77, temperature 97.7 F (36.5 C), temperature source Oral, resp. rate 17, weight 147 lb 6.4 oz (66.9 kg), SpO2 98%.  Wt Readings from Last 3 Encounters:  01/21/23 147 lb 6.4 oz (66.9 kg)  10/04/22 143 lb 9.6 oz (65.1 kg)  05/23/22 145 lb 1 oz (65.8 kg)    Body mass index is 22.41 kg/m.  Performance status (ECOG): 1 - Symptomatic but completely ambulatory  PHYSICAL EXAM:   Physical Exam Vitals and nursing note reviewed. Exam conducted with a chaperone present.  Constitutional:      Appearance: Normal appearance.  Cardiovascular:     Rate and Rhythm: Normal rate and regular rhythm.     Pulses: Normal pulses.     Heart sounds: Normal heart sounds.  Pulmonary:     Effort: Pulmonary effort is normal.     Breath sounds: Normal breath sounds.  Abdominal:     Palpations: Abdomen is soft. There is no hepatomegaly, splenomegaly or mass.     Tenderness: There  is no abdominal tenderness.  Musculoskeletal:     Right lower leg: No edema.     Left lower leg: No edema.  Lymphadenopathy:     Cervical: No cervical adenopathy.     Right cervical: No superficial, deep or posterior cervical adenopathy.    Left cervical: No superficial, deep or posterior cervical adenopathy.     Upper Body:     Right upper body: No supraclavicular or axillary adenopathy.     Left upper body: No supraclavicular or axillary adenopathy.  Neurological:     General: No focal deficit present.     Mental Status: She is alert and oriented to person, place, and time.  Psychiatric:        Mood and Affect: Mood normal.        Behavior: Behavior normal.     LABS:      Latest Ref Rng & Units 01/11/2023    9:18 AM 09/20/2022   12:45 PM 05/09/2022    8:57 AM  CBC  WBC 4.0 - 10.5 K/uL 3.4  3.6  2.6   Hemoglobin 12.0 - 15.0 g/dL 66.4  40.3  47.4   Hematocrit 36.0 - 46.0 % 39.0  40.4  37.1   Platelets 150 - 400 K/uL 161  192  189       Latest Ref Rng & Units 01/11/2023    9:18 AM 09/20/2022   12:45 PM 05/09/2022    8:57 AM  CMP  Glucose 70 - 99 mg/dL 259  563  875   BUN 8 - 23 mg/dL 18  16  14    Creatinine 0.44 - 1.00 mg/dL 6.43  3.29  5.18   Sodium 135 - 145 mmol/L 137  139  139   Potassium 3.5 - 5.1 mmol/L 3.7  4.1  3.9   Chloride 98 -  111 mmol/L 103  104  107   CO2 22 - 32 mmol/L 24  27  25    Calcium 8.9 - 10.3 mg/dL 8.7  9.6  8.6   Total Protein 6.5 - 8.1 g/dL 6.7  7.1  6.8   Total Bilirubin 0.3 - 1.2 mg/dL 1.2  1.3  1.0   Alkaline Phos 38 - 126 U/L 26  28  26    AST 15 - 41 U/L 21  24  25    ALT 0 - 44 U/L 15  17  18       No results found for: "CEA1", "CEA" / No results found for: "CEA1", "CEA" No results found for: "PSA1" No results found for: "YQM578" No results found for: "CAN125"  No results found for: "TOTALPROTELP", "ALBUMINELP", "A1GS", "A2GS", "BETS", "BETA2SER", "GAMS", "MSPIKE", "SPEI" No results found for: "TIBC", "FERRITIN", "IRONPCTSAT" Lab Results   Component Value Date   LDH 161 05/22/2021   LDH 166 02/21/2021   LDH 148 11/09/2020     STUDIES:   No results found.

## 2023-01-21 ENCOUNTER — Inpatient Hospital Stay: Payer: PPO | Admitting: Hematology

## 2023-01-21 ENCOUNTER — Other Ambulatory Visit: Payer: Self-pay | Admitting: *Deleted

## 2023-01-21 VITALS — BP 152/66 | HR 77 | Temp 97.7°F | Resp 17 | Wt 147.4 lb

## 2023-01-21 DIAGNOSIS — C9210 Chronic myeloid leukemia, BCR/ABL-positive, not having achieved remission: Secondary | ICD-10-CM | POA: Diagnosis not present

## 2023-01-21 DIAGNOSIS — D0511 Intraductal carcinoma in situ of right breast: Secondary | ICD-10-CM

## 2023-01-21 MED ORDER — TAMOXIFEN CITRATE 20 MG PO TABS: 20.0000 mg | ORAL_TABLET | Freq: Every day | ORAL | 6 refills | Status: DC

## 2023-01-21 NOTE — Telephone Encounter (Signed)
Tamoxifen refill approved.  Patient tolerating and is to continue therapy.

## 2023-01-21 NOTE — Patient Instructions (Signed)
[facility] at [facility] Discharge Instructions   You were seen and examined today by Dr. Ellin Saba.  He reviewed the results of your lab work which are normal/stable. The special blood test we send to check the status of your CML is pending. We will follow-up on the results and let you know if there is there is anything of concern.   We will see you back in 3 months. We will repeat lab work 2 weeks prior to this visit.   Return as scheduled.    Thank you for choosing [facility] at [facility] to provide your oncology and hematology care.  To afford each patient quality time with our provider, please arrive at least 15 minutes before your scheduled appointment time.   If you have a lab appointment with the Cancer Center please come in thru the Main Entrance and check in at the main information desk.  You need to re-schedule your appointment should you arrive 10 or more minutes late.  We strive to give you quality time with our providers, and arriving late affects you and other patients whose appointments are after yours.  Also, if you no show three or more times for appointments you may be dismissed from the clinic at the providers discretion.     Again, thank you for choosing [facility].  Our hope is that these requests will decrease the amount of time that you wait before being seen by our physicians.       _____________________________________________________________  Should you have questions after your visit to [facility], please contact our office at [phone] and follow the prompts.  Our office hours are 8:00 a.m. and 4:30 p.m. Monday - Friday.  Please note that voicemails left after 4:00 p.m. may not be returned until the following business day.  We are closed weekends and major holidays.  You do have access to a nurse 24-7, just call the main number to the clinic [phone] and do not press any options,  hold on the line and a nurse will answer the phone.    For prescription refill requests, have your pharmacy contact our office and allow 72 hours.    Due to Covid, you will need to wear a mask upon entering the hospital. If you do not have a mask, a mask will be given to you at the Main Entrance upon arrival. For doctor visits, patients may have 1 support person age 86 or older with them. For treatment visits, patients can not have anyone with them due to social distancing guidelines and our immunocompromised population.

## 2023-01-21 NOTE — Progress Notes (Signed)
Patient is taking Sprycel as prescribed.  She has not missed any doses and reports no side effects at this time.   ?

## 2023-01-24 LAB — BCR-ABL1, CML/ALL, PCR, QUANT
E1A2 Transcript: <0.0032 %
Interpretation (BCRAL):: NEGATIVE
b2a2 transcript: <0.0032 %
b3a2 transcript: <0.0032 %

## 2023-02-05 ENCOUNTER — Ambulatory Visit
Admission: EM | Admit: 2023-02-05 | Discharge: 2023-02-05 | Disposition: A | Discharge status: Home / Self Care | Payer: PPO | Attending: Family Medicine | Admitting: Family Medicine

## 2023-02-05 DIAGNOSIS — B9789 Other viral agents as the cause of diseases classified elsewhere: Secondary | ICD-10-CM | POA: Diagnosis not present

## 2023-02-05 DIAGNOSIS — J069 Acute upper respiratory infection, unspecified: Secondary | ICD-10-CM | POA: Diagnosis not present

## 2023-02-05 DIAGNOSIS — R3915 Urgency of urination: Secondary | ICD-10-CM | POA: Insufficient documentation

## 2023-02-05 DIAGNOSIS — Z1152 Encounter for screening for COVID-19: Secondary | ICD-10-CM | POA: Insufficient documentation

## 2023-02-05 DIAGNOSIS — N390 Urinary tract infection, site not specified: Secondary | ICD-10-CM | POA: Insufficient documentation

## 2023-02-05 DIAGNOSIS — R059 Cough, unspecified: Secondary | ICD-10-CM | POA: Insufficient documentation

## 2023-02-05 DIAGNOSIS — R350 Frequency of micturition: Secondary | ICD-10-CM | POA: Diagnosis not present

## 2023-02-05 LAB — POCT URINALYSIS DIP (MANUAL ENTRY)
Glucose, UA: NEGATIVE mg/dL
Nitrite, UA: POSITIVE — AB
Protein Ur, POC: 30 mg/dL — AB
Spec Grav, UA: 1.015 (ref 1.010–1.025)
Urobilinogen, UA: 1 U/dL
pH, UA: 7 (ref 5.0–8.0)

## 2023-02-05 MED ORDER — BENZONATATE 100 MG PO CAPS: 100.0000 mg | ORAL_CAPSULE | Freq: Three times a day (TID) | ORAL | 0 refills | Status: DC

## 2023-02-05 MED ORDER — CEPHALEXIN 500 MG PO CAPS: 500.0000 mg | ORAL_CAPSULE | Freq: Two times a day (BID) | ORAL | 0 refills | Status: DC

## 2023-02-05 MED ORDER — FLUTICASONE PROPIONATE 50 MCG/ACT NA SUSP: 1.0000 | Freq: Two times a day (BID) | NASAL | 2 refills | Status: DC

## 2023-02-05 NOTE — ED Triage Notes (Signed)
Pt c/o cough, chest congestion,  nasal congestion, hoarseness and raw sots in mouth x 3 days

## 2023-02-05 NOTE — ED Provider Notes (Signed)
[facility]    CSN: [id number] Arrival date & time: 02/05/23  1210      History   Chief Complaint No chief complaint on file.   HPI [name] is a 88 y.o. female.   Patient presenting today with several day history of runny nose, sinus pressure, bilateral ear pressure, cough.  Denies fever, chills, body aches, chest pain, shortness of breath, abdominal pain, nausea vomiting or diarrhea.  So far trying over-the-counter remedies with no relief.  No known history of chronic pulmonary disease.  No known sick contacts recently.  She is also having several months of worsening urinary frequency, urgency, leakage.  Wanting to have her urine checked to make sure she does not have a UTI.  Denies any dysuria, flank pain, hematuria, fever, chills, nausea, vomiting.    Past Medical History:  Diagnosis Date   Bladder infection, chronic    Cancer (HCC) 02/2018   right breast cancer   Complication of anesthesia    GERD (gastroesophageal reflux disease)    Hemorrhoids    Hypercholesterolemia    Polymyalgia rheumatica (HCC)    PONV (postoperative nausea and vomiting)    S/P colonoscopy Jun 14, 2004   friable internal and external hemorrhoids, left-sided diverticula   S/P endoscopy August 13, 2006   pale 1-3 mm nodules consistent with benign squamous papilloma, tiny hiatal hernia   Serrated adenoma of colon     Patient Active Problem List   Diagnosis Date Noted   OP (osteoporosis) 04/03/2022   Acute lateral meniscal tear, right, sequela 03/13/2022   Pancytopenia, acquired (HCC) 11/16/2020   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   Leukocytosis 02/22/2020   Breast neoplasm, Tis (DCIS), 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 Jena Gauss- serrated adenomaL side diverticulosis, hemorrhoids   COLONOSCOPY  06/14/04   friable internal and external hemorrhoids, left-sided diverticula   COLONOSCOPY N/A 08/23/2016   Dr. Jena Gauss: Diverticulosis, medium-sized grade 2 internal hemorrhoids.   ESOPHAGOGASTRODUODENOSCOPY  08/13/06   pale 1-3 mm nodules consistent with benign squamous papilloma, tiny hiatal hernia   ESOPHAGOGASTRODUODENOSCOPY N/A 08/23/2016   Dr. Jena Gauss: Normal   FLEXIBLE SIGMOIDOSCOPY  11/15/2011   Procedure: FLEXIBLE SIGMOIDOSCOPY;  Surgeon: Corbin Ade, MD;  Location: [facility];  Service: Endoscopy;  Laterality: N/A;  12:30PM   KNEE ARTHROSCOPY WITH LATERAL MENISECTOMY Right 03/13/2022   Procedure: KNEE ARTHROSCOPY WITH LATERAL MENISCECTOMY;  Surgeon: Vickki Hearing, MD;  Location: [facility];  Service: Orthopedics;  Laterality: Right;   WRIST FRACTURE SURGERY Right     OB History   No obstetric history on file.      Home Medications    Prior to Admission medications   Medication Sig Start Date End Date Taking? Authorizing Provider  benzonatate (TESSALON) 100 MG capsule Take 1 capsule (100 mg total) by mouth every 8 (eight) hours. 02/05/23  Yes Particia Nearing, PA-C  cephALEXin (KEFLEX) 500 MG capsule Take 1  capsule (500 mg total) by mouth 2 (two) times daily. 02/05/23  Yes Particia Nearing, PA-C  fluticasone [facility]) 50 MCG/ACT nasal spray Place 1 spray into both nostrils 2 (two) times daily. 02/05/23  Yes Particia Nearing, PA-C  acetaminophen (TYLENOL) 500 MG tablet Take 500-1,000 mg by mouth every 6 (six) hours as needed (pain.).    [provider]   Calcium-Magnesium-Vitamin D (CALCIUM 1200+D3 PO) Take 1 tablet by mouth in the morning.    [provider]  dasatinib (SPRYCEL) 20 MG tablet Take one tablet daily Monday thru friday 01/14/23   Doreatha Massed, MD  Magnesium 500 MG TABS Take 500 mg by mouth in the morning.    [provider]  Multiple Vitamin (MULTIVITAMIN WITH MINERALS) TABS tablet Take 1 tablet by mouth in the morning. Centrum Silver    [provider]  polyethylene glycol (MIRALAX / GLYCOLAX) packet Take 17 g by mouth in the morning.    [provider]  tamoxifen (NOLVADEX) 20 MG tablet Take 1 tablet (20 mg total) by mouth daily. 01/21/23   Doreatha Massed, MD  Wheat Dextrin (BENEFIBER) POWD Take 1 Dose by mouth daily.    [provider]    Family History Family History  Problem Relation Age of Onset   Thyroid cancer Mother    Parkinson's disease Sister    Thyroid disease Brother    Aneurysm Sister    Thyroid cancer Other    Thyroid cancer Other    Colon cancer Neg Hx     Social History Social History   Tobacco Use   Smoking status: Never   Smokeless tobacco: Never  Vaping Use   Vaping status: Never Used  Substance Use Topics   Alcohol use: Yes    Alcohol/week: 2.0 standard drinks of alcohol    Types: 2 Glasses of wine per week    Comment: Drinks wine 2-3 days per week ( usually a couple of glasses each time)   Drug use: No     Allergies   Fish allergy and Shrimp [shellfish allergy]   Review of Systems Review of Systems Per HPI  Physical Exam Triage Vital Signs ED Triage Vitals  Encounter Vitals Group     BP 02/05/23 1218 (!) 121/59     Systolic BP Percentile --      Diastolic BP Percentile --      Pulse Rate 02/05/23 1218 92     Resp 02/05/23 1218 15     Temp 02/05/23 1218 98.3 F (36.8 C)     Temp Source 02/05/23 1218 Oral     SpO2 02/05/23 1218 91 %     Weight --      Height --      Head Circumference --      Peak Flow --       Pain Score 02/05/23 1219 5     Pain Loc --      Pain Education --      Exclude from Growth Chart --    No data found.  Updated Vital Signs BP (!) 121/59 (BP Location: Right Arm)   Pulse 92   Temp 98.3 F (36.8 C) (Oral)   Resp 15   SpO2 91%   Visual Acuity Right Eye Distance:   Left Eye Distance:   Bilateral Distance:    Right Eye Near:   Left Eye Near:    Bilateral Near:     Physical Exam Vitals and nursing note reviewed.  Constitutional:  Appearance: Normal appearance. She is not ill-appearing.  HENT:     Head: Atraumatic.     Nose: Rhinorrhea present.     Mouth/Throat:     Mouth: Mucous membranes are moist.     Pharynx: Oropharynx is clear. Posterior oropharyngeal erythema present. No oropharyngeal exudate.  Eyes:     Extraocular Movements: Extraocular movements intact.     Conjunctiva/sclera: Conjunctivae normal.  Cardiovascular:     Rate and Rhythm: Normal rate and regular rhythm.     Heart sounds: Normal heart sounds.  Pulmonary:     Effort: Pulmonary effort is normal.     Breath sounds: Normal breath sounds. No wheezing or rales.  Abdominal:     General: Bowel sounds are normal. There is no distension.     Palpations: Abdomen is soft.     Tenderness: There is no abdominal tenderness. There is no right CVA tenderness, left CVA tenderness or guarding.  Musculoskeletal:        General: Normal range of motion.     Cervical back: Normal range of motion and neck supple.  Skin:    General: Skin is warm and dry.  Neurological:     Mental Status: She is alert and oriented to person, place, and time.  Psychiatric:        Mood and Affect: Mood normal.        Thought Content: Thought content normal.        Judgment: Judgment normal.      UC Treatments / Results  Labs (all labs ordered are listed, but only abnormal results are displayed) Labs Reviewed  POCT URINALYSIS DIP (MANUAL ENTRY) - Abnormal; Notable for the following components:      Result  Value   Clarity, UA cloudy (*)    Bilirubin, UA small (*)    Ketones, POC UA small (15) (*)    Blood, UA trace-intact (*)    Protein Ur, POC =30 (*)    Nitrite, UA Positive (*)    Leukocytes, UA Small (1+) (*)    All other components within normal limits  SARS CORONAVIRUS 2 (TAT 6-24 HRS)  URINE CULTURE    EKG   Radiology No results found.  Procedures Procedures (including critical care time)  Medications Ordered in UC Medications - No data to display  Initial Impression / Assessment and Plan / UC Course  I have reviewed the triage vital signs and the nursing notes.  Pertinent labs & imaging results that were available during my care of the patient were reviewed by me and considered in my medical decision making (see chart for details).     Vital signs and exam reassuring today, suspect viral upper respiratory causing her symptoms today.  COVID test pending, good candidate for Paxlovid if positive.  Will treat with Tessalon, Flonase, Coricidin HBP and other supportive measures additionally.  Urinalysis today with evidence of a UTI.  Urine culture pending, treat with Keflex, fluids and return for worsening symptoms.  Final Clinical Impressions(s) / UC Diagnoses   Final diagnoses:  Viral URI with cough  Urinary frequency  Urinary urgency  Acute lower UTI   Discharge Instructions   None    ED Prescriptions     Medication Sig Dispense Auth. Provider   cephALEXin (KEFLEX) 500 MG capsule Take 1 capsule (500 mg total) by mouth 2 (two) times daily. 10 capsule Particia Nearing, PA-C   benzonatate (TESSALON) 100 MG capsule Take 1 capsule (100 mg total) by mouth every 8 (eight)  hours. 21 capsule Particia Nearing, PA-C   fluticasone [facility]) 50 MCG/ACT nasal spray Place 1 spray into both nostrils 2 (two) times daily. 16 g Particia Nearing, [place]      PDMP not reviewed this encounter.   Particia Nearing, [place] 02/05/23 1305

## 2023-02-06 LAB — SARS CORONAVIRUS 2 (TAT 6-24 HRS): SARS Coronavirus 2: NEGATIVE

## 2023-02-08 LAB — URINE CULTURE: Culture: >=100000 — AB

## 2023-02-09 ENCOUNTER — Telehealth: Payer: Self-pay | Admitting: Family Medicine

## 2023-02-09 ENCOUNTER — Telehealth: Payer: Self-pay

## 2023-02-09 MED ORDER — CIPROFLOXACIN HCL 500 MG PO TABS: 500.0000 mg | ORAL_TABLET | Freq: Two times a day (BID) | ORAL | 0 refills | Status: DC

## 2023-02-09 NOTE — Telephone Encounter (Signed)
Completed Keflex for UTI, E. coli positive urine culture.  States she is feeling no better, will trial Cipro and have her follow-up for a recheck if still not resolving

## 2023-02-09 NOTE — Telephone Encounter (Signed)
Pt called stating that the cephalexin did not work for her UTI and she is still in pain. After speaking with provider, she will call in cirpo 5 day course to [employer].

## 2023-03-25 ENCOUNTER — Other Ambulatory Visit (HOSPITAL_COMMUNITY): Payer: Self-pay

## 2023-04-08 ENCOUNTER — Telehealth: Payer: Self-pay | Admitting: Emergency Medicine

## 2023-04-08 ENCOUNTER — Inpatient Hospital Stay: Payer: PPO | Attending: Hematology

## 2023-04-08 DIAGNOSIS — D0511 Intraductal carcinoma in situ of right breast: Secondary | ICD-10-CM | POA: Insufficient documentation

## 2023-04-08 DIAGNOSIS — C9210 Chronic myeloid leukemia, BCR/ABL-positive, not having achieved remission: Secondary | ICD-10-CM | POA: Insufficient documentation

## 2023-04-08 DIAGNOSIS — M810 Age-related osteoporosis without current pathological fracture: Secondary | ICD-10-CM | POA: Insufficient documentation

## 2023-04-08 DIAGNOSIS — Z79810 Long term (current) use of selective estrogen receptor modulators (SERMs): Secondary | ICD-10-CM | POA: Insufficient documentation

## 2023-04-08 DIAGNOSIS — Z808 Family history of malignant neoplasm of other organs or systems: Secondary | ICD-10-CM | POA: Insufficient documentation

## 2023-04-08 NOTE — Telephone Encounter (Signed)
Left VM with Dr Harley Hallmark nurse that we will need a new order for her prolia if Dr Margo Aye wants her to continue it.  Fax number provided. Her appt is scheduled for 04/10/23.

## 2023-04-09 ENCOUNTER — Telehealth: Payer: Self-pay | Admitting: Emergency Medicine

## 2023-04-09 DIAGNOSIS — R809 Proteinuria, unspecified: Secondary | ICD-10-CM | POA: Diagnosis not present

## 2023-04-09 DIAGNOSIS — E782 Mixed hyperlipidemia: Secondary | ICD-10-CM | POA: Diagnosis not present

## 2023-04-09 DIAGNOSIS — R7301 Impaired fasting glucose: Secondary | ICD-10-CM | POA: Diagnosis not present

## 2023-04-09 NOTE — Telephone Encounter (Signed)
Left VM to call back to get her prolia injection rescheduled. Office number provided.

## 2023-04-10 ENCOUNTER — Encounter (HOSPITAL_COMMUNITY): Payer: PPO

## 2023-04-10 ENCOUNTER — Inpatient Hospital Stay: Payer: PPO

## 2023-04-10 ENCOUNTER — Encounter (HOSPITAL_COMMUNITY): Admission: RE | Admit: 2023-04-10 | Payer: PPO | Source: Ambulatory Visit

## 2023-04-10 DIAGNOSIS — Z79810 Long term (current) use of selective estrogen receptor modulators (SERMs): Secondary | ICD-10-CM | POA: Diagnosis not present

## 2023-04-10 DIAGNOSIS — M810 Age-related osteoporosis without current pathological fracture: Secondary | ICD-10-CM | POA: Diagnosis not present

## 2023-04-10 DIAGNOSIS — C9210 Chronic myeloid leukemia, BCR/ABL-positive, not having achieved remission: Secondary | ICD-10-CM | POA: Diagnosis not present

## 2023-04-10 DIAGNOSIS — D0511 Intraductal carcinoma in situ of right breast: Secondary | ICD-10-CM | POA: Diagnosis not present

## 2023-04-10 DIAGNOSIS — Z808 Family history of malignant neoplasm of other organs or systems: Secondary | ICD-10-CM | POA: Diagnosis not present

## 2023-04-10 LAB — CBC WITH DIFFERENTIAL/PLATELET
Abs Immature Granulocytes: 0.02 10*3/uL (ref 0.00–0.07)
Basophils Absolute: 0 10*3/uL (ref 0.0–0.1)
Basophils Relative: 1 %
Eosinophils Absolute: 0.1 10*3/uL (ref 0.0–0.5)
Eosinophils Relative: 2 %
HCT: 39.9 % (ref 36.0–46.0)
Hemoglobin: 12.9 g/dL (ref 12.0–15.0)
Immature Granulocytes: 1 %
Lymphocytes Relative: 45 %
Lymphs Abs: 1.8 10*3/uL (ref 0.7–4.0)
MCH: 30.9 pg (ref 26.0–34.0)
MCHC: 32.3 g/dL (ref 30.0–36.0)
MCV: 95.5 fL (ref 80.0–100.0)
Monocytes Absolute: 0.3 10*3/uL (ref 0.1–1.0)
Monocytes Relative: 9 %
Neutro Abs: 1.7 10*3/uL (ref 1.7–7.7)
Neutrophils Relative %: 42 %
Platelets: 193 10*3/uL (ref 150–400)
RBC: 4.18 MIL/uL (ref 3.87–5.11)
RDW: 12.8 % (ref 11.5–15.5)
WBC: 3.9 10*3/uL — ABNORMAL LOW (ref 4.0–10.5)
nRBC: 0 % (ref 0.0–0.2)

## 2023-04-10 LAB — COMPREHENSIVE METABOLIC PANEL
ALT: 21 U/L (ref 0–44)
AST: 26 U/L (ref 15–41)
Albumin: 4.2 g/dL (ref 3.5–5.0)
Alkaline Phosphatase: 26 U/L — ABNORMAL LOW (ref 38–126)
Anion gap: 9 (ref 5–15)
BUN: 17 mg/dL (ref 8–23)
CO2: 21 mmol/L — ABNORMAL LOW (ref 22–32)
Calcium: 8.8 mg/dL — ABNORMAL LOW (ref 8.9–10.3)
Chloride: 107 mmol/L (ref 98–111)
Creatinine, Ser: 0.84 mg/dL (ref 0.44–1.00)
GFR, Estimated: >60 mL/min (ref >60)
Glucose, Bld: 114 mg/dL — ABNORMAL HIGH (ref 70–99)
Potassium: 4.1 mmol/L (ref 3.5–5.1)
Sodium: 137 mmol/L (ref 135–145)
Total Bilirubin: 0.7 mg/dL (ref <1.2)
Total Protein: 6.9 g/dL (ref 6.5–8.1)

## 2023-04-11 ENCOUNTER — Other Ambulatory Visit: Payer: Self-pay

## 2023-04-11 ENCOUNTER — Telehealth: Payer: Self-pay

## 2023-04-11 NOTE — Telephone Encounter (Signed)
Auth Submission: NO AUTH NEEDED Site of care: Site of care: [facility] Payer: [employer] medicare Medication & CPT/J Code(s) submitted: Prolia (Denosumab) [id number] Route of submission (phone, fax, portal): phone Phone # Fax # Auth type: Buy/Bill PB Units/visits requested: 60mg , q56months Reference number: [id number] Approval from: 04/11/23 to 05/07/23

## 2023-04-15 DIAGNOSIS — R809 Proteinuria, unspecified: Secondary | ICD-10-CM | POA: Diagnosis not present

## 2023-04-15 DIAGNOSIS — R4189 Other symptoms and signs involving cognitive functions and awareness: Secondary | ICD-10-CM | POA: Diagnosis not present

## 2023-04-15 DIAGNOSIS — M791 Myalgia, unspecified site: Secondary | ICD-10-CM | POA: Diagnosis not present

## 2023-04-15 DIAGNOSIS — S83281D Other tear of lateral meniscus, current injury, right knee, subsequent encounter: Secondary | ICD-10-CM | POA: Diagnosis not present

## 2023-04-15 DIAGNOSIS — Z0001 Encounter for general adult medical examination with abnormal findings: Secondary | ICD-10-CM | POA: Diagnosis not present

## 2023-04-15 DIAGNOSIS — M810 Age-related osteoporosis without current pathological fracture: Secondary | ICD-10-CM | POA: Diagnosis not present

## 2023-04-15 DIAGNOSIS — N3941 Urge incontinence: Secondary | ICD-10-CM | POA: Diagnosis not present

## 2023-04-15 DIAGNOSIS — R7301 Impaired fasting glucose: Secondary | ICD-10-CM | POA: Diagnosis not present

## 2023-04-15 DIAGNOSIS — C9210 Chronic myeloid leukemia, BCR/ABL-positive, not having achieved remission: Secondary | ICD-10-CM | POA: Diagnosis not present

## 2023-04-15 DIAGNOSIS — R944 Abnormal results of kidney function studies: Secondary | ICD-10-CM | POA: Diagnosis not present

## 2023-04-15 DIAGNOSIS — E782 Mixed hyperlipidemia: Secondary | ICD-10-CM | POA: Diagnosis not present

## 2023-04-15 DIAGNOSIS — C50919 Malignant neoplasm of unspecified site of unspecified female breast: Secondary | ICD-10-CM | POA: Diagnosis not present

## 2023-04-16 ENCOUNTER — Encounter (HOSPITAL_COMMUNITY): Payer: PPO | Attending: Internal Medicine | Admitting: Internal Medicine

## 2023-04-16 VITALS — BP 129/46 | HR 71 | Temp 98.2°F | Resp 18

## 2023-04-16 DIAGNOSIS — M810 Age-related osteoporosis without current pathological fracture: Secondary | ICD-10-CM | POA: Insufficient documentation

## 2023-04-16 MED ORDER — DENOSUMAB 60 MG/ML ~~LOC~~ SOSY
60.0000 mg | PREFILLED_SYRINGE | Freq: Once | SUBCUTANEOUS | Status: AC
  Administered 2023-04-16: 60 mg via SUBCUTANEOUS

## 2023-04-16 NOTE — Progress Notes (Signed)
Diagnosis: Osteoporosis  Provider:  Dwana Melena MD  Procedure: Injection  Prolia (Denosumab), Dose: 60 mg, Site: subcutaneous, Number of injections: 1  Injection Site(s): Right arm  Post Care: Observation period completed  Discharge: Condition: Good, Destination: Home . AVS Provided  Performed by:  Cleotilde Neer, LPN

## 2023-04-18 LAB — BCR-ABL1, CML/ALL, PCR, QUANT
E1A2 Transcript: <0.0032 %
Interpretation (BCRAL):: NEGATIVE
b2a2 transcript: <0.0032 %
b3a2 transcript: <0.0032 %

## 2023-04-22 NOTE — Progress Notes (Signed)
[facility] [address] [place] [address]    Clinic Day:  04/23/2023  Referring physician: Benita Stabile, MD  Patient Care Team: Benita Stabile, MD as PCP - General (Internal Medicine) Jena Gauss Gerrit Friends, MD (Gastroenterology) Doreatha Massed, MD as Medical Oncologist (Medical Oncology)   ASSESSMENT & PLAN:   Assessment: 1.  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  BCR/ABL by FISH on 02/23/2020 was positive for fusion protein. 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 -Chromosome analysis confirms Philadelphia chromosome. -BCR/ABL by quantitative PCR with B2 A2 transcript 132% -Dasatinib 50 mg daily started on 03/13/2020. - Sprycel dose reduced to 20 mg daily on 05/19/2020 secondary to leukopenia/neutropenia. - Sprycel dose changed to 20 mg daily Monday through Friday due to tiredness and dizziness on 06/05/2021.   2.  Right breast DCIS: - Screening mammogram on 02/03/2018 was abnormal. -Ultrasound of the right breast on 02/11/2018 shows mass measuring 0.5 x 0.4 x 0.5 cm, circumscribed hypoechoic in the 3:30 o'clock location of the right breast.  Right axilla ultrasound was negative for adenopathy. - Right breast core biopsy on 02/18/2018 shows ductal carcinoma with papillary configuration.  In the comment section, it notes that this may represent DCIS involving a papillary lesion or it may represent an intracystic papillary ductal carcinoma.  ER/PR was 95% positive. - Right breast lumpectomy on 03/12/2018, pathology showing intermediate grade DCIS, 0.6 cm, with areas showing papillary configuration.  Posterior margin was 0.2 cm.  No evidence of invasion. - Radiation therapy was refused. -Tamoxifen for 5 years started on 04/09/2018.   3.  Osteoporosis: -DEXA scan on 04/11/2018 with T score of -2.9. -DEXA scan on 05/26/2020 with T score -2.3.    Plan: 1.  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 - She denies any B symptoms.  No recent infections. - Physical exam: No palpable splenomegaly. - Labs from 04/10/2023: Normal LFTs and creatinine.  CBC with mild leukopenia of white count 3.9.  BCR/ABL by quantitative PCR is undetectable. - Continue Dasatinib 20 mg daily Monday through Friday.  Her last positive to BCR/ABL PCR was in May 2022.  If she continues to be positive, we will discuss with her about treatment free remission in the future.   2.  Right breast DCIS: - Mammogram on 05/29/2022: BI-RADS Category 2. - She will stop tamoxifen end of this month which will complete 5 years.  She will have mammogram in January.   3.  Osteoporosis (DEXA scan 05/29/2022 T-score -2.2, previously -2.9): - Continue Prolia injections at Dr. Scharlene Gloss office..    Orders Placed This Encounter  Procedures   CBC with Differential    Standing Status:   Future    Expected Date:   07/22/2023    Expiration Date:   04/22/2024   Comprehensive metabolic panel    Standing Status:   Future    Expected Date:   07/22/2023    Expiration Date:   04/22/2024   BCR-ABL1, CML/ALL, PCR, QUANT    Standing Status:   Future    Expected Date:   07/22/2023    Expiration Date:   04/22/2024   Lactate dehydrogenase    Standing Status:   Future    Expected Date:   07/22/2023    Expiration Date:   04/22/2024    Release to patient:   Immediate [1]    Remote health to draw?:  No      I,Helena R Teague,acting as a scribe for Doreatha Massed, MD.,have documented all relevant documentation on the behalf of Doreatha Massed, MD,as directed by  Doreatha Massed, MD while in the presence of Doreatha Massed, MD.  I, Doreatha Massed MD, have reviewed the above documentation for accuracy and completeness, and I agree with the above.    Doreatha Massed, MD   12/17/20245:05 PM  CHIEF COMPLAINT:   Diagnosis: CML and right breast DCIS    Cancer Staging  No matching staging  information was found for the patient.    Prior Therapy: right lumpectomy, 03/12/18   Current Therapy:  Tamoxifen 20 mg daily and Dasatinib 20 mg daily    HISTORY OF PRESENT ILLNESS:   Oncology History   No history exists.     INTERVAL HISTORY:   [name] is a 88 y.o. female presenting to clinic today for follow up of CML and right breast DCIS. She was last seen by me on 01/21/23.  Since her last visit, she was seen in the ED on 02/05/23 for a viral URI with cough.   Today, she states that she is doing well overall. Her appetite level is at 50%. Her energy level is at 100%.  She did not receive her Sprycel medication this month, though she has extra medication at home that she has been taking as prescribed. She denies any nausea, vomiting, diarrhea, or constipation from Sprycel. She reports occasional dizziness. She still receives prolia injections from Dr. Margo Aye.   PAST MEDICAL HISTORY:   Past Medical History: Past Medical History:  Diagnosis Date   Bladder infection, chronic    Cancer (HCC) 02/2018   right breast cancer   Complication of anesthesia    GERD (gastroesophageal reflux disease)    Hemorrhoids    Hypercholesterolemia    Polymyalgia rheumatica (HCC)    PONV (postoperative nausea and vomiting)    S/P colonoscopy Jun 14, 2004   friable internal and external hemorrhoids, left-sided diverticula   S/P endoscopy August 13, 2006   pale 1-3 mm nodules consistent with benign squamous papilloma, tiny hiatal hernia   Serrated adenoma of colon     Surgical History: Past Surgical History:  Procedure Laterality Date   ABDOMINAL HYSTERECTOMY     BREAST LUMPECTOMY WITH RADIOACTIVE SEED LOCALIZATION Right 03/12/2018   Procedure: RIGHT BREAST LUMPECTOMY WITH RADIOACTIVE SEED LOCALIZATION;  Surgeon: Claud Kelp, MD;  Location: [facility];  Service: General;  Laterality: Right;   CHOLECYSTECTOMY     COLONOSCOPY  11/20/2010   Dr. Jena Gauss- serrated adenomaL side  diverticulosis, hemorrhoids   COLONOSCOPY  06/14/04   friable internal and external hemorrhoids, left-sided diverticula   COLONOSCOPY N/A 08/23/2016   Dr. Jena Gauss: Diverticulosis, medium-sized grade 2 internal hemorrhoids.   ESOPHAGOGASTRODUODENOSCOPY  08/13/06   pale 1-3 mm nodules consistent with benign squamous papilloma, tiny hiatal hernia   ESOPHAGOGASTRODUODENOSCOPY N/A 08/23/2016   Dr. Jena Gauss: Normal   FLEXIBLE SIGMOIDOSCOPY  11/15/2011   Procedure: FLEXIBLE SIGMOIDOSCOPY;  Surgeon: Corbin Ade, MD;  Location: [facility];  Service: Endoscopy;  Laterality: N/A;  12:30PM   KNEE ARTHROSCOPY WITH LATERAL MENISECTOMY Right 03/13/2022   Procedure: KNEE ARTHROSCOPY WITH LATERAL MENISCECTOMY;  Surgeon: Vickki Hearing, MD;  Location: [facility];  Service: Orthopedics;  Laterality: Right;   WRIST FRACTURE SURGERY Right     Social History: Social History   Socioeconomic History   Marital status: Widowed    Spouse name: Not on file   Number  of children: 2   Years of education: Not on file   Highest education level: Not on file  Occupational History   Not on file  Tobacco Use   Smoking status: Never   Smokeless tobacco: Never  Vaping Use   Vaping status: Never Used  Substance and Sexual Activity   Alcohol use: Yes    Alcohol/week: 2.0 standard drinks of alcohol    Types: 2 Glasses of wine per week    Comment: Drinks wine 2-3 days per week ( usually a couple of glasses each time)   Drug use: No   Sexual activity: Not on file  Other Topics Concern   Not on file  Social History Narrative   Not on file   Social Drivers of Health   Financial Resource Strain: Low Risk  (03/08/2020)   Overall Financial Resource Strain (CARDIA)    Difficulty of Paying Living Expenses: Not hard at all  Food Insecurity: No Food Insecurity (03/08/2020)   Hunger Vital Sign    Worried About Running Out of Food in the Last Year: Never true    Ran Out of Food in the Last Year: Never true  Transportation  Needs: No Transportation Needs (03/08/2020)   PRAPARE - Administrator, Civil Service (Medical): No    Lack of Transportation (Non-Medical): No  Physical Activity: Insufficiently Active (03/08/2020)   Exercise Vital Sign    Days of Exercise per Week: 7 days    Minutes of Exercise per Session: 20 min  Stress: No Stress Concern Present (03/08/2020)   [employer] of Occupational Health - Occupational Stress Questionnaire    Feeling of Stress : Not at all  Social Connections: Moderately Isolated (03/08/2020)   Social Connection and Isolation Panel [NHANES]    Frequency of Communication with Friends and Family: Twice a week    Frequency of Social Gatherings with Friends and Family: Twice a week    Attends Religious Services: More than 4 times per year    Active Member of [employer] or Organizations: No    Attends Banker Meetings: Never    Marital Status: Widowed  Intimate Partner Violence: Not At Risk (03/08/2020)   Humiliation, Afraid, Rape, and Kick questionnaire    Fear of Current or Ex-Partner: No    Emotionally Abused: No    Physically Abused: No    Sexually Abused: No    Family History: Family History  Problem Relation Age of Onset   Thyroid cancer Mother    Parkinson's disease Sister    Thyroid disease Brother    Aneurysm Sister    Thyroid cancer Other    Thyroid cancer Other    Colon cancer Neg Hx     Current Medications:  Current Outpatient Medications:    acetaminophen (TYLENOL) 500 MG tablet, Take 500-1,000 mg by mouth every 6 (six) hours as needed (pain.)., Disp: , Rfl:    Calcium-Magnesium-Vitamin D (CALCIUM 1200+D3 PO), Take 1 tablet by mouth in the morning., Disp: , Rfl:    dasatinib (SPRYCEL) 20 MG tablet, Take one tablet daily Monday thru friday, Disp: 20 tablet, Rfl: 3   Magnesium 500 MG TABS, Take 500 mg by mouth in the morning., Disp: , Rfl:    Multiple Vitamin (MULTIVITAMIN WITH MINERALS) TABS tablet, Take 1 tablet by mouth in the  morning. Centrum Silver, Disp: , Rfl:    MYRBETRIQ 25 MG TB24 tablet, Take 25 mg by mouth daily., Disp: , Rfl:    polyethylene glycol (MIRALAX /  GLYCOLAX) packet, Take 17 g by mouth in the morning., Disp: , Rfl:    tamoxifen (NOLVADEX) 20 MG tablet, Take 1 tablet (20 mg total) by mouth daily., Disp: 90 tablet, Rfl: 6   Allergies: Allergies  Allergen Reactions   Fish Allergy Nausea And Vomiting   Shrimp [Shellfish Allergy] Nausea And Vomiting    REVIEW OF SYSTEMS:   Review of Systems  Constitutional:  Negative for chills, fatigue and fever.  HENT:   Positive for trouble swallowing. Negative for lump/mass, mouth sores, nosebleeds and sore throat.   Eyes:  Negative for eye problems.  Respiratory:  Negative for cough and shortness of breath.   Cardiovascular:  Negative for chest pain, leg swelling and palpitations.  Gastrointestinal:  Negative for abdominal pain, constipation, diarrhea, nausea and vomiting.  Genitourinary:  Negative for bladder incontinence, difficulty urinating, dysuria, frequency, hematuria and nocturia.   Musculoskeletal:  Negative for arthralgias, back pain, flank pain, myalgias and neck pain.  Skin:  Negative for itching and rash.  Neurological:  Positive for dizziness. Negative for headaches and numbness.  Hematological:  Does not bruise/bleed easily.  Psychiatric/Behavioral:  Positive for sleep disturbance. Negative for depression and suicidal ideas. The patient is not nervous/anxious.   All other systems reviewed and are negative.    VITALS:   Blood pressure 129/60, pulse 78, temperature 98.3 F (36.8 C), temperature source Tympanic, resp. rate 18, height 5\' 8"  (1.727 m), weight 147 lb (66.7 kg), SpO2 98%.  Wt Readings from Last 3 Encounters:  04/23/23 147 lb (66.7 kg)  01/21/23 147 lb 6.4 oz (66.9 kg)  10/04/22 143 lb 9.6 oz (65.1 kg)    Body mass index is 22.35 kg/m.  Performance status (ECOG): 1 - Symptomatic but completely ambulatory  PHYSICAL  EXAM:   Physical Exam Vitals and nursing note reviewed. Exam conducted with a chaperone present.  Constitutional:      Appearance: Normal appearance.  Cardiovascular:     Rate and Rhythm: Normal rate and regular rhythm.     Pulses: Normal pulses.     Heart sounds: Normal heart sounds.  Pulmonary:     Effort: Pulmonary effort is normal.     Breath sounds: Normal breath sounds.  Abdominal:     Palpations: Abdomen is soft. There is no hepatomegaly, splenomegaly or mass.     Tenderness: There is no abdominal tenderness.  Musculoskeletal:     Right lower leg: No edema.     Left lower leg: No edema.  Lymphadenopathy:     Cervical: No cervical adenopathy.     Right cervical: No superficial, deep or posterior cervical adenopathy.    Left cervical: No superficial, deep or posterior cervical adenopathy.     Upper Body:     Right upper body: No supraclavicular or axillary adenopathy.     Left upper body: No supraclavicular or axillary adenopathy.  Neurological:     General: No focal deficit present.     Mental Status: She is alert and oriented to person, place, and time.  Psychiatric:        Mood and Affect: Mood normal.        Behavior: Behavior normal.     LABS:      Latest Ref Rng & Units 04/10/2023    9:12 AM 01/11/2023    9:18 AM 09/20/2022   12:45 PM  CBC  WBC 4.0 - 10.5 K/uL 3.9  3.4  3.6   Hemoglobin 12.0 - 15.0 g/dL 16.1  09.6  13.2  Hematocrit 36.0 - 46.0 % 39.9  39.0  40.4   Platelets 150 - 400 K/uL 193  161  192       Latest Ref Rng & Units 04/10/2023    9:12 AM 01/11/2023    9:18 AM 09/20/2022   12:45 PM  CMP  Glucose 70 - 99 mg/dL 161  096  045   BUN 8 - 23 mg/dL 17  18  16    Creatinine 0.44 - 1.00 mg/dL 4.09  8.11  9.14   Sodium 135 - 145 mmol/L 137  137  139   Potassium 3.5 - 5.1 mmol/L 4.1  3.7  4.1   Chloride 98 - 111 mmol/L 107  103  104   CO2 22 - 32 mmol/L 21  24  27    Calcium 8.9 - 10.3 mg/dL 8.8  8.7  9.6   Total Protein 6.5 - 8.1 g/dL 6.9  6.7  7.1    Total Bilirubin <1.2 mg/dL 0.7  1.2  1.3   Alkaline Phos 38 - 126 U/L 26  26  28    AST 15 - 41 U/L 26  21  24    ALT 0 - 44 U/L 21  15  17       No results found for: "CEA1", "CEA" / No results found for: "CEA1", "CEA" No results found for: "PSA1" No results found for: "NWG956" No results found for: "CAN125"  No results found for: "TOTALPROTELP", "ALBUMINELP", "A1GS", "A2GS", "BETS", "BETA2SER", "GAMS", "MSPIKE", "SPEI" No results found for: "TIBC", "FERRITIN", "IRONPCTSAT" Lab Results  Component Value Date   LDH 161 05/22/2021   LDH 166 02/21/2021   LDH 148 11/09/2020     STUDIES:   No results found.

## 2023-04-23 ENCOUNTER — Inpatient Hospital Stay: Payer: PPO | Admitting: Hematology

## 2023-04-23 ENCOUNTER — Other Ambulatory Visit (HOSPITAL_COMMUNITY): Payer: Self-pay | Admitting: Hematology

## 2023-04-23 VITALS — BP 129/60 | HR 78 | Temp 98.3°F | Resp 18 | Ht 68.0 in | Wt 147.0 lb

## 2023-04-23 DIAGNOSIS — D0511 Intraductal carcinoma in situ of right breast: Secondary | ICD-10-CM

## 2023-04-23 DIAGNOSIS — C9210 Chronic myeloid leukemia, BCR/ABL-positive, not having achieved remission: Secondary | ICD-10-CM

## 2023-04-23 DIAGNOSIS — Z1231 Encounter for screening mammogram for malignant neoplasm of breast: Secondary | ICD-10-CM

## 2023-04-23 NOTE — Progress Notes (Signed)
Patient is taking Sprycel as prescribed. She has not missed any doses and reports no side effects at this time.   

## 2023-04-23 NOTE — Patient Instructions (Addendum)
[facility]  Discharge Instructions  You were seen and examined today by Dr. Ellin Saba.  Dr. Ellin Saba discussed your most recent lab work which is stable. Continue your Sprycel as prescribed.  Your mammogram is due in January, we will arrange for you to have this done here at the hospital. Doreatha Martin this bottle of Tamoxifen and then you can stop it.  Follow-up as scheduled.  Thank you for choosing [facility] to provide your oncology and hematology care.   To afford each patient quality time with our provider, please arrive at least 15 minutes before your scheduled appointment time. You may need to reschedule your appointment if you arrive late (10 or more minutes). Arriving late affects you and other patients whose appointments are after yours.  Also, if you miss three or more appointments without notifying the office, you may be dismissed from the clinic at the provider's discretion.    Again, thank you for choosing [facility].  Our hope is that these requests will decrease the amount of time that you wait before being seen by our physicians.   If you have a lab appointment with the Cancer Center - please note that after April 8th, all labs will be drawn in the cancer center.  You do not have to check in or register with the main entrance as you have in the past but will complete your check-in at the cancer center.            _____________________________________________________________  Should you have questions after your visit to [facility], please contact our office at [phone] and follow the prompts.  Our office hours are 8:00 a.m. to 4:30 p.m. Monday - Thursday and 8:00 a.m. to 2:30 p.m. Friday.  Please note that voicemails left after 4:00 p.m. may not be returned until the following business day.  We are closed weekends and all major holidays.  You do have access to a nurse 24-7, just call the main  number to the clinic [phone] and do not press any options, hold on the line and a nurse will answer the phone.    For prescription refill requests, have your pharmacy contact our office and allow 72 hours.    Masks are no longer required in the cancer centers. If you would like for your care team to wear a mask while they are taking care of you, please let them know. You may have one support person who is at least 86 years old accompany you for your appointments.

## 2023-05-03 ENCOUNTER — Other Ambulatory Visit (HOSPITAL_COMMUNITY): Payer: Self-pay

## 2023-05-03 ENCOUNTER — Telehealth: Payer: Self-pay

## 2023-05-03 NOTE — Telephone Encounter (Signed)
Oral Oncology Patient Advocate Encounter  Reached out and spoke with patient regarding PAP paperwork, explained that I would send it to their preferred email via DocuSign.   Confirmed email address: [email or web address].    Patient expressed understanding and consent.  Will follow up once paperwork has been signed and returned.   Ardeen Fillers, CPhT Oncology Pharmacy Patient Advocate  [facility]  [phone] (phone) [phone] (fax) 05/03/2023 8:54 AM

## 2023-05-03 NOTE — Telephone Encounter (Signed)
Oral Oncology Patient Advocate Encounter   Received notification that patient is due for re-enrollment for assistance for Sprycel through [employer] Patient [facility].   Re-enrollment process has been initiated and will be submitted upon completion of necessary documents.  BMSPAF's phone number [phone].   I will continue to follow until final determination.   Ardeen Fillers, CPhT Oncology Pharmacy Patient Advocate  [facility]  [phone] (phone) [phone] (fax) 05/03/2023 8:17 AM

## 2023-05-03 NOTE — Telephone Encounter (Signed)
Called and left VM on both patient's and Zuhra Lingley' phone numbers to set up time to obtain signatures. I will continue to reach out to the patient. I will continue to follow and update until final determination.    Ardeen Fillers, CPhT Oncology Pharmacy Patient Advocate  [facility]  [phone] (phone) [phone] (fax) 05/03/2023 8:18 AM

## 2023-05-07 NOTE — Telephone Encounter (Signed)
 Oral Oncology Patient Advocate Encounter   Submitted application for assistance for Sprycel  to [employer] Patient [employer].   Application submitted via e-fax to [phone].   BMSPAF's phone number [phone].   I will continue to check the status until final determination.    Morene Potters, CPhT Oncology Pharmacy Patient Advocate  [facility]  [phone] (phone) [phone] (fax) 05/07/2023 8:23 AM

## 2023-05-13 ENCOUNTER — Other Ambulatory Visit: Payer: Self-pay | Admitting: Internal Medicine

## 2023-05-14 ENCOUNTER — Telehealth: Payer: Self-pay | Admitting: Pharmacist

## 2023-05-14 NOTE — Telephone Encounter (Signed)
 Oral Chemotherapy Pharmacist Encounter  Successfully enrolled patient for copayment assistance funds from [employer] from the [facility] fund.  Award amount: $8,000 Effective dates: 05/14/23 - 05/13/24 ID: [id number] BIN: [id number] Group: [id number] PCN: [id number]  Billing information will be shared with [facility]. Copy will be placed a copy of the award letter to be scanned into patient's chart.  Kent Riendeau N. Dalbert Stillings, PharmD, BCPS Hematology/Oncology Clinical Pharmacist [facility] [phone]  05/14/2023 3:04 PM

## 2023-05-15 ENCOUNTER — Other Ambulatory Visit: Payer: Self-pay

## 2023-05-15 ENCOUNTER — Encounter: Payer: Self-pay | Admitting: Internal Medicine

## 2023-05-15 ENCOUNTER — Other Ambulatory Visit: Payer: Self-pay | Admitting: Pharmacist

## 2023-05-15 ENCOUNTER — Other Ambulatory Visit (HOSPITAL_COMMUNITY): Payer: Self-pay

## 2023-05-15 DIAGNOSIS — C9210 Chronic myeloid leukemia, BCR/ABL-positive, not having achieved remission: Secondary | ICD-10-CM

## 2023-05-15 MED ORDER — DASATINIB 20 MG PO TABS
20.0000 mg | ORAL_TABLET | Freq: Every day | ORAL | 0 refills | Status: DC
  Filled 2023-05-15: qty 20, 28d supply, fill #0

## 2023-05-15 NOTE — Telephone Encounter (Signed)
 Oral Oncology Patient Advocate Encounter  **PAP to [facility] in Jan 2025THORA Ferretti funding opened while application was processing. PAP application cancelled and grant funding obtained.    Was successful in securing patient a $8,000.00 grant from [employer] Co-Payment [employer] to provide copayment coverage for Sprycel .  This will keep the out of pocket expense at $0.      The billing information is as follows and has been shared with [facility].   Member ID: [id number] Group ID: [id number] RxBin: [id number] PCN: [id number] Dates of Eligibility: 05/14/23 through 05/13/24  Fund name:  Chronic Myeloid Leukemia   Morene Potters, CPhT Oncology Pharmacy Patient Advocate  [facility]  [phone] (phone) [phone] (fax) 05/15/2023 11:11 AM

## 2023-05-15 NOTE — Progress Notes (Signed)
 Specialty Pharmacy Initiation Note   [name] is a 89 y.o. female who will be followed by the specialty pharmacy service for RxSp Oncology    Review of administration, indication, effectiveness, safety, potential side effects, storage/disposable, and missed dose instructions occurred today for patient's specialty medication(s) Dasatinib  (SPRYCEL )     Patient/Caregiver did not have any additional questions or concerns.   Patient's therapy is appropriate to: Continue    Goals Addressed             This Visit's Progress    Achieve or maintain remission       Patient is on track. Patient will maintain adherence        Patient switching from PAP to filling at [facility] (Specialty)  Renaee LOISE Ditch Specialty Pharmacist

## 2023-05-15 NOTE — Telephone Encounter (Signed)
 Patient successfully OnBoarded. Medication scheduled to be shipped on Thursday, 05/16/23, for delivery on Friday, 05/17/23, from [facility] to patient's address. Patient also knows to call me at [phone] with any questions or concerns regarding receiving medication or if there is any unexpected change in co-pay.    Morene Potters, CPhT Oncology Pharmacy Patient Advocate  [facility]  [phone] (phone) [phone] (fax) 05/15/2023 3:09 PM

## 2023-05-15 NOTE — Progress Notes (Signed)
 Patient switching from PAP to filling at [facility] (Specialty)

## 2023-05-15 NOTE — Progress Notes (Signed)
 Specialty Pharmacy Initial Fill Coordination Note  [name] is a 89 y.o. female contacted today regarding initial fill of specialty medication(s) Dasatinib  (SPRYCEL )  Patient requested Delivery   Delivery date: 05/17/23   Verified address: [address] [place], [place] [address]  Medication will be filled on 05/16/23.   Patient is aware of $0.00 copayment. Bill [employer] Secondary.    Morene Potters, CPhT Oncology Pharmacy Patient Advocate  [facility]  [phone] (phone) [phone] (fax) 05/15/2023 3:08 PM

## 2023-05-16 ENCOUNTER — Other Ambulatory Visit (HOSPITAL_COMMUNITY): Payer: Self-pay

## 2023-05-16 ENCOUNTER — Other Ambulatory Visit: Payer: Self-pay

## 2023-05-17 ENCOUNTER — Other Ambulatory Visit: Payer: Self-pay

## 2023-05-20 ENCOUNTER — Other Ambulatory Visit: Payer: Self-pay

## 2023-05-21 ENCOUNTER — Other Ambulatory Visit: Payer: Self-pay

## 2023-05-22 ENCOUNTER — Other Ambulatory Visit: Payer: Self-pay

## 2023-05-24 ENCOUNTER — Other Ambulatory Visit: Payer: Self-pay

## 2023-05-31 ENCOUNTER — Telehealth: Payer: Self-pay

## 2023-05-31 NOTE — Telephone Encounter (Signed)
Auth Submission: NO AUTH NEEDED Site of care: Site of care: [facility] Payer: [employer]tg Medication & CPT/J Code(s) submitted: Prolia (Denosumab) [id number] Route of submission (phone, fax, portal): phone Phone # Fax # Auth type: Buy/Bill PB Units/visits requested: 60mg ,q86months Reference number: [id number] Approval from: 05/31/2023 to 05/06/2024

## 2023-06-03 ENCOUNTER — Ambulatory Visit (HOSPITAL_COMMUNITY): Payer: PPO

## 2023-06-04 ENCOUNTER — Other Ambulatory Visit (HOSPITAL_COMMUNITY): Payer: Self-pay

## 2023-06-10 ENCOUNTER — Other Ambulatory Visit: Payer: Self-pay | Admitting: Hematology

## 2023-06-10 ENCOUNTER — Other Ambulatory Visit: Payer: Self-pay

## 2023-06-10 DIAGNOSIS — C9210 Chronic myeloid leukemia, BCR/ABL-positive, not having achieved remission: Secondary | ICD-10-CM

## 2023-06-10 MED ORDER — DASATINIB 20 MG PO TABS
20.0000 mg | ORAL_TABLET | Freq: Every day | ORAL | 3 refills | Status: DC
Start: 2023-06-10 — End: 2023-10-01
  Filled 2023-06-10: qty 20, 28d supply, fill #0
  Filled 2023-07-08: qty 20, 28d supply, fill #1
  Filled 2023-08-12: qty 20, 28d supply, fill #2
  Filled 2023-09-09: qty 20, 28d supply, fill #3

## 2023-06-10 NOTE — Progress Notes (Signed)
Specialty Pharmacy Refill Coordination Note  [name] is a 89 y.o. female contacted today regarding refills of specialty medication(s) Dasatinib [facility])   Patient requested Delivery   Delivery date: 06/18/23   Verified address: [address] [place], [place] [address]   Medication will be filled on 06/17/23. Pending Refill Request.

## 2023-06-10 NOTE — Progress Notes (Signed)
Specialty Pharmacy Ongoing Clinical Assessment Note  [name] is a 89 y.o. female who is being followed by the specialty pharmacy service for RxSp Oncology   Patient's specialty medication(s) reviewed today: Dasatinib (SPRYCEL)   Missed doses in the last 4 weeks: 0   Patient/Caregiver did not have any additional questions or concerns.   Therapeutic benefit summary: Unable to assess   Adverse events/side effects summary: No adverse events/side effects   Patient's therapy is appropriate to: Continue    Goals Addressed             This Visit's Progress    Stabilization of disease       Patient is on track. Patient will be evaluated at upcoming provider appointment to assess progress         Follow up:  3 months  Bobette Mo Specialty Pharmacist

## 2023-06-11 ENCOUNTER — Other Ambulatory Visit: Payer: Self-pay

## 2023-06-13 ENCOUNTER — Ambulatory Visit (HOSPITAL_COMMUNITY)
Admission: RE | Admit: 2023-06-13 | Discharge: 2023-06-13 | Disposition: A | Discharge status: Home / Self Care | Payer: PPO | Source: Ambulatory Visit | Attending: Hematology | Admitting: Hematology

## 2023-06-13 ENCOUNTER — Encounter (HOSPITAL_COMMUNITY): Payer: Self-pay

## 2023-06-13 DIAGNOSIS — Z1231 Encounter for screening mammogram for malignant neoplasm of breast: Secondary | ICD-10-CM | POA: Diagnosis not present

## 2023-06-17 ENCOUNTER — Other Ambulatory Visit (HOSPITAL_COMMUNITY): Payer: Self-pay

## 2023-07-05 ENCOUNTER — Other Ambulatory Visit: Payer: Self-pay

## 2023-07-08 ENCOUNTER — Other Ambulatory Visit: Payer: Self-pay

## 2023-07-08 ENCOUNTER — Ambulatory Visit: Payer: PPO | Admitting: Orthopedic Surgery

## 2023-07-08 ENCOUNTER — Other Ambulatory Visit (INDEPENDENT_AMBULATORY_CARE_PROVIDER_SITE_OTHER)

## 2023-07-08 ENCOUNTER — Encounter: Payer: Self-pay | Admitting: Orthopedic Surgery

## 2023-07-08 VITALS — BP 154/75 | HR 94 | Ht 68.0 in | Wt 150.0 lb

## 2023-07-08 DIAGNOSIS — S20222A Contusion of left back wall of thorax, initial encounter: Secondary | ICD-10-CM | POA: Diagnosis not present

## 2023-07-08 DIAGNOSIS — W19XXXA Unspecified fall, initial encounter: Secondary | ICD-10-CM | POA: Diagnosis not present

## 2023-07-08 DIAGNOSIS — S7002XA Contusion of left hip, initial encounter: Secondary | ICD-10-CM | POA: Diagnosis not present

## 2023-07-08 DIAGNOSIS — M1712 Unilateral primary osteoarthritis, left knee: Secondary | ICD-10-CM | POA: Diagnosis not present

## 2023-07-08 DIAGNOSIS — G8929 Other chronic pain: Secondary | ICD-10-CM

## 2023-07-08 DIAGNOSIS — M25552 Pain in left hip: Secondary | ICD-10-CM

## 2023-07-08 DIAGNOSIS — M25562 Pain in left knee: Secondary | ICD-10-CM | POA: Diagnosis not present

## 2023-07-08 DIAGNOSIS — M545 Low back pain, unspecified: Secondary | ICD-10-CM

## 2023-07-08 MED ORDER — METHYLPREDNISOLONE ACETATE 40 MG/ML IJ SUSP
40.0000 mg | Freq: Once | INTRAMUSCULAR | Status: AC
Start: 2023-07-08 — End: 2023-07-08
  Administered 2023-07-08: 40 mg via INTRA_ARTICULAR

## 2023-07-08 NOTE — Patient Instructions (Signed)
 You have received an injection of steroids into the joint. 15% of patients will have increased pain within the 24 hours postinjection.   This is transient and will go away.   We recommend that you use ice packs on the injection site for 20 minutes every 2 hours and extra strength Tylenol 2 tablets every 8 as needed until the pain resolves.  If you continue to have pain after taking the Tylenol and using the ice please call the office for further instructions.

## 2023-07-08 NOTE — Progress Notes (Signed)
   BP (!) 154/75 (Cuff Size: Small)   Pulse 94   Ht 5\' 8"  (1.727 m)   Wt 150 lb (68 kg)   BMI 22.81 kg/m   Body mass index is 22.81 kg/m.  Chief Complaint  Patient presents with   Knee Pain    Patient in today with pain in the left knee and she fell on 07/04/23 she fell backwards and now the lower left hip, back and knee is hurting  she was unable to put weight on it on Friday  she used ice and its somewhat better but hurts     Encounter Diagnoses  Name Primary?   Pain in left hip Yes   Chronic pain of left knee    Lumbar pain     DOI/DOS/ Date: 07/04/2023  Worse

## 2023-07-08 NOTE — Progress Notes (Signed)
 Specialty Pharmacy Refill Coordination Note  [name] is a 89 y.o. female contacted today regarding refills of specialty medication(s) Dasatinib [facility])   Patient requested Delivery   Delivery date: 07/12/23   Verified address: [address]   [place] [address]   Medication will be filled on 07/11/23.

## 2023-07-08 NOTE — Progress Notes (Signed)
 Established patient new problem  Last seen April 11, 2022   Chief Complaint  Patient presents with   Knee Pain    Patient in today with pain in the left knee and she fell on 07/04/23 she fell backwards and now the lower left hip, back and knee is hurting  she was unable to put weight on it on Friday  she used ice and its somewhat better but hurts    89 year old female had a successful right knee arthroscopy presents for evaluation of her left knee.  She reports dull aching pain left knee no trauma.  She says it feels somewhat like her right knee did before the arthroscopy.  However, she also fell backwards at her home complains of left hip pain.  She says that is getting better but initially she was unable to bear weight on the left side.  She comes in with a walking stick    Knee Pain  Pertinent negatives include no tingling.   Review of Systems  Musculoskeletal:  Positive for back pain, falls and joint pain.  Neurological:  Negative for tingling, tremors, sensory change, focal weakness and weakness.   Problem list, medical hx, medications and allergies reviewed    BP (!) 154/75 (Cuff Size: Small)   Pulse 94   Ht 5\' 8"  (1.727 m)   Wt 150 lb (68 kg)   BMI 22.81 kg/m   Physical Exam Vitals and nursing note reviewed.  Constitutional:      Appearance: Normal appearance.  HENT:     Head: Normocephalic and atraumatic.  Eyes:     General: No scleral icterus.       Right eye: No discharge.        Left eye: No discharge.     Extraocular Movements: Extraocular movements intact.     Conjunctiva/sclera: Conjunctivae normal.     Pupils: Pupils are equal, round, and reactive to light.  Cardiovascular:     Rate and Rhythm: Normal rate.     Pulses: Normal pulses.  Musculoskeletal:     Comments: Left knee Skin envelope is intact Tenderness medial joint line no effusion Passive range of motion is full Instability tests were normal Muscle tone normal  Left hip Skin  envelope normal No palpable tenderness No pain with range of motion No instability  Lumbar Skin intact Tenderness left buttock no midline tenderness Negative straight leg raises  Skin:    General: Skin is warm and dry.     Capillary Refill: Capillary refill takes less than 2 seconds.  Neurological:     General: No focal deficit present.     Mental Status: She is alert and oriented to person, place, and time.     Sensory: No sensory deficit.     Motor: No weakness.     Gait: Gait abnormal.  Psychiatric:        Mood and Affect: Mood normal.        Behavior: Behavior normal.        Thought Content: Thought content normal.        Judgment: Judgment normal.    Encounter Diagnoses  Name Primary?   Pain in left hip Yes   Acute pain of left knee    Lumbar pain    Contusion of left side of back, initial encounter    Contusion of left hip, initial encounter     Assessment and plan  DG Lumbar Spine 2-3 Views Result Date: 07/08/2023 Hospitalist follow-up Left-sided lower back pain and  hip pain X-ray 3 views lumbar spine No acute lumbar fractures arthritis in the facet joints starting at L3 contributing all the way to S1 no significant disc space narrowing but grade 1 spondylolisthesis L4 on 5 some calcification of the aortic vessels as well Impression no acute fracture lumbar spondylosis Grade 1 spondylolisthesis   DG HIP UNILAT WITH PELVIS 2-3 VIEWS LEFT Result Date: 07/08/2023 Left hip and pelvis Status post fall X-ray shows a normal femoral head neck normal joint is well No evidence of arthritis Impression normal hip joint   DG Knee AP/LAT W/Sunrise Left Result Date: 07/08/2023 X-ray report Chief complaint painful left knee Images 3 views left knee and 1 AP right knee Reading: Left knee shows valgus alignment no evidence of fracture no significant joint space narrowing small tiny osteophytes around the patella Impression: Mild OA left knee no fracture  Acute painful left knee mild OA  probably has meniscus tear patient opted for injection versus proceeding towards MRI and surgery  Contusions back and hip recommend ibuprofen, rest, heating pad, hot shower, Epsom salt soaks hot bath  Procedure note left knee injection   verbal consent was obtained to inject left knee joint  Timeout was completed to confirm the site of injection  The medications used were depomedrol 40 mg and 1% lidocaine 3 cc Anesthesia was provided by ethyl chloride and the skin was prepped with alcohol.  After cleaning the skin with alcohol a 20-gauge needle was used to inject the left knee joint. There were no complications. A sterile bandage was applied.

## 2023-07-11 ENCOUNTER — Other Ambulatory Visit: Payer: Self-pay

## 2023-07-11 NOTE — Progress Notes (Signed)
 Ok   Procedure note left knee injection   verbal consent was obtained to inject left knee joint  Timeout was completed to confirm the site of injection  The medications used were depomedrol 40 mg and 1% lidocaine 3 cc Anesthesia was provided by ethyl chloride and the skin was prepped with alcohol.  After cleaning the skin with alcohol a 20-gauge needle was used to inject the left knee joint. There were no complications. A sterile bandage was applied.

## 2023-07-15 ENCOUNTER — Other Ambulatory Visit (HOSPITAL_COMMUNITY): Payer: Self-pay

## 2023-08-09 ENCOUNTER — Other Ambulatory Visit: Payer: Self-pay

## 2023-08-12 ENCOUNTER — Other Ambulatory Visit: Payer: Self-pay

## 2023-08-12 NOTE — Progress Notes (Signed)
 Specialty Pharmacy Refill Coordination Note  [name] is a 89 y.o. female contacted today regarding refills of specialty medication(s) Dasatinib [facility])   Patient requested Delivery   Delivery date: 08/16/23   Verified address: [address] [place] [address]   Medication will be filled on 08/15/23.

## 2023-08-13 ENCOUNTER — Inpatient Hospital Stay: Payer: PPO | Attending: Hematology

## 2023-08-13 DIAGNOSIS — D0511 Intraductal carcinoma in situ of right breast: Secondary | ICD-10-CM | POA: Diagnosis not present

## 2023-08-13 DIAGNOSIS — Z79810 Long term (current) use of selective estrogen receptor modulators (SERMs): Secondary | ICD-10-CM | POA: Diagnosis not present

## 2023-08-13 DIAGNOSIS — C9210 Chronic myeloid leukemia, BCR/ABL-positive, not having achieved remission: Secondary | ICD-10-CM | POA: Diagnosis not present

## 2023-08-13 LAB — COMPREHENSIVE METABOLIC PANEL WITH GFR
ALT: 21 U/L (ref 0–44)
AST: 27 U/L (ref 15–41)
Albumin: 4.3 g/dL (ref 3.5–5.0)
Alkaline Phosphatase: 44 U/L (ref 38–126)
Anion gap: 9 (ref 5–15)
BUN: 16 mg/dL (ref 8–23)
CO2: 26 mmol/L (ref 22–32)
Calcium: 9.4 mg/dL (ref 8.9–10.3)
Chloride: 103 mmol/L (ref 98–111)
Creatinine, Ser: 0.85 mg/dL (ref 0.44–1.00)
GFR, Estimated: >60 mL/min (ref >60)
Glucose, Bld: 106 mg/dL — ABNORMAL HIGH (ref 70–99)
Potassium: 3.9 mmol/L (ref 3.5–5.1)
Sodium: 138 mmol/L (ref 135–145)
Total Bilirubin: 1.1 mg/dL (ref 0.0–1.2)
Total Protein: 7.3 g/dL (ref 6.5–8.1)

## 2023-08-13 LAB — CBC WITH DIFFERENTIAL/PLATELET
Abs Immature Granulocytes: 0.03 10*3/uL (ref 0.00–0.07)
Basophils Absolute: 0 10*3/uL (ref 0.0–0.1)
Basophils Relative: 1 %
Eosinophils Absolute: 0.1 10*3/uL (ref 0.0–0.5)
Eosinophils Relative: 2 %
HCT: 39.1 % (ref 36.0–46.0)
Hemoglobin: 12.8 g/dL (ref 12.0–15.0)
Immature Granulocytes: 1 %
Lymphocytes Relative: 49 %
Lymphs Abs: 2.3 10*3/uL (ref 0.7–4.0)
MCH: 31.6 pg (ref 26.0–34.0)
MCHC: 32.7 g/dL (ref 30.0–36.0)
MCV: 96.5 fL (ref 80.0–100.0)
Monocytes Absolute: 0.4 10*3/uL (ref 0.1–1.0)
Monocytes Relative: 9 %
Neutro Abs: 1.8 10*3/uL (ref 1.7–7.7)
Neutrophils Relative %: 38 %
Platelets: 238 10*3/uL (ref 150–400)
RBC: 4.05 MIL/uL (ref 3.87–5.11)
RDW: 13.8 % (ref 11.5–15.5)
WBC: 4.7 10*3/uL (ref 4.0–10.5)
nRBC: 0 % (ref 0.0–0.2)

## 2023-08-13 LAB — LACTATE DEHYDROGENASE: LDH: 173 U/L (ref 98–192)

## 2023-08-15 ENCOUNTER — Other Ambulatory Visit: Payer: Self-pay

## 2023-08-18 LAB — BCR-ABL1, CML/ALL, PCR, QUANT
E1A2 Transcript: <0.0032 %
b2a2 transcript: 0.0053 %
b3a2 transcript: <0.0032 %

## 2023-08-27 ENCOUNTER — Inpatient Hospital Stay: Payer: PPO | Admitting: Hematology

## 2023-08-27 VITALS — BP 158/75 | HR 76 | Temp 98.0°F | Resp 18 | Wt 148.8 lb

## 2023-08-27 DIAGNOSIS — C9210 Chronic myeloid leukemia, BCR/ABL-positive, not having achieved remission: Secondary | ICD-10-CM

## 2023-08-27 NOTE — Progress Notes (Signed)
Patient is taking Sprycel as prescribed. She has not missed any doses and reports no side effects at this time.   

## 2023-08-27 NOTE — Patient Instructions (Addendum)
 [facility] at [facility] Discharge Instructions   You were seen and examined today by Dr. Cheree Cords.  He reviewed the results of your lab work which are mostly normal/stable. Your BCR-ABL was positive. Continue Sprycel  as prescribed.   We will see you back in 4 months. We will repeat lab work prior to this visit.   Return as scheduled.    Thank you for choosing [facility] at [facility] to provide your oncology and hematology care.  To afford each patient quality time with our provider, please arrive at least 15 minutes before your scheduled appointment time.   If you have a lab appointment with the Cancer Center please come in thru the Main Entrance and check in at the main information desk.  You need to re-schedule your appointment should you arrive 10 or more minutes late.  We strive to give you quality time with our providers, and arriving late affects you and other patients whose appointments are after yours.  Also, if you no show three or more times for appointments you may be dismissed from the clinic at the providers discretion.     Again, thank you for choosing [facility].  Our hope is that these requests will decrease the amount of time that you wait before being seen by our physicians.       _____________________________________________________________  Should you have questions after your visit to [facility], please contact our office at [phone] and follow the prompts.  Our office hours are 8:00 a.m. and 4:30 p.m. Monday - Friday.  Please note that voicemails left after 4:00 p.m. may not be returned until the following business day.  We are closed weekends and major holidays.  You do have access to a nurse 24-7, just call the main number to the clinic [phone] and do not press any options, hold on the line and a nurse will answer the phone.    For prescription refill requests, have your pharmacy  contact our office and allow 72 hours.    Due to Covid, you will need to wear a mask upon entering the hospital. If you do not have a mask, a mask will be given to you at the Main Entrance upon arrival. For doctor visits, patients may have 1 support person age 31 or older with them. For treatment visits, patients can not have anyone with them due to social distancing guidelines and our immunocompromised population.

## 2023-08-27 NOTE — Progress Notes (Signed)
 [name] [address], [place] [address]    Clinic Day:  08/27/2023  Referring physician: Omie Bickers, MD  Patient Care Team: Jeanette Bickers, MD as PCP - General (Internal Medicine) Jeanette Cheadle Windsor Hatcher, MD (Gastroenterology) Jeanette Boros, MD as Medical Oncologist (Medical Oncology)   ASSESSMENT & PLAN:   Assessment: 1.  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  BCR/ABL by FISH on 02/23/2020 was positive for fusion protein. 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 -Chromosome analysis confirms Philadelphia chromosome. -BCR/ABL by quantitative PCR with B2 A2 transcript 132% -Dasatinib  50 mg daily started on 03/13/2020. - Sprycel  dose reduced to 20 mg daily on 05/19/2020 secondary to leukopenia/neutropenia. - Sprycel  dose changed to 20 mg daily Monday through Friday due to tiredness and dizziness on 06/05/2021.   2.  Right breast DCIS: - Screening mammogram on 02/03/2018 was abnormal. -Ultrasound of the right breast on 02/11/2018 shows mass measuring 0.5 x 0.4 x 0.5 cm, circumscribed hypoechoic in the 3:30 o'clock location of the right breast.  Right axilla ultrasound was negative for adenopathy. - Right breast core biopsy on 02/18/2018 shows ductal carcinoma with papillary configuration.  In the comment section, it notes that this may represent DCIS involving a papillary lesion or it may represent an intracystic papillary ductal carcinoma.  ER/PR was 95% positive. - Right breast lumpectomy on 03/12/2018, pathology showing intermediate grade DCIS, 0.6 cm, with areas showing papillary configuration.  Posterior margin was 0.2 cm.  No evidence of invasion. - Radiation therapy was refused. -Tamoxifen  for 5 years started on 04/09/2018 completed in December 2024.   3.  Osteoporosis: -DEXA scan on 04/11/2018 with T score of -2.9. -DEXA scan on 05/26/2020 with T score -2.3.    Plan: 1.  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 - She does not have any fevers, night sweats or weight loss.  She has occasional dizziness and reportedly fell 1 time backwards and was evaluated by her doctor. - Labs today: CBC was normal.  LDH and LFTs are normal.  Electrolytes are normal. - BCR/ABL by quantitative PCR shows B2 A2 transcript at 0.0053%.  It is still below the threshold. - I recommend continuing Dasatinib  20 mg daily Monday through Friday.  RTC 4 months for follow-up with repeat BCR/ABL by PCR. - I have recommended that she check her blood pressure at home and report to either Dr. Del Favia or me.  Today the blood pressure is slightly high at 158/75.   2.  Right breast DCIS: - She has completed 5 years of tamoxifen  end of December 2024. - Mammogram on 06/13/2023: BI-RADS Category 1.  Continue yearly mammograms.   3.  Osteoporosis (DEXA scan 05/29/2022 T-score -2.2, previously -2.9): - She is continuing Prolia  injections at Dr. Quentin Brunner office every 6 months.  Latest calcium level is 9.4.    Orders Placed This Encounter  Procedures   CBC with Differential    Standing Status:   Future    Expected Date:   12/23/2023    Expiration Date:   08/26/2024   Comprehensive metabolic panel    Standing Status:   Future    Expected Date:   12/23/2023    Expiration Date:   08/26/2024   Lactate dehydrogenase    Standing Status:   Future    Expected Date:   12/23/2023    Expiration Date:   08/26/2024  BCR-ABL1, CML/ALL, PCR, QUANT    Standing Status:   Future    Expected Date:   12/23/2023    Expiration Date:   08/26/2024      [name],acting as a scribe for Jeanette Boros, MD.,have documented all relevant documentation on the behalf of Jeanette Boros, MD,as directed by  Jeanette Boros, MD while in the presence of Jeanette Boros, MD.  I, Jeanette Boros MD, have reviewed the above documentation for accuracy and completeness, and I agree with the  above.     Jeanette Boros, MD   4/22/202511:27 AM  CHIEF COMPLAINT:   Diagnosis: CML and right breast DCIS    Cancer Staging  No matching staging information was found for the patient.    Prior Therapy: right lumpectomy, 03/12/18   Current Therapy:  Tamoxifen  20 mg daily and Dasatinib  20 mg daily    HISTORY OF PRESENT ILLNESS:   Oncology History   No history exists.     INTERVAL HISTORY:   [name] is a 89 y.o. female presenting to clinic today for follow up of CML and right breast DCIS. She was last seen by me on 04/23/23.  Since her last visit, she underwent bilateral screening mammogram on 06/13/23 that found: No mammographic evidence of malignancy.   Today, she states that she is doing well overall. Her appetite level is at 100%. Her energy level is at 100%. She reports dizziness over the past couple of months. Cass also notes a few recent falls when working in her garden and walking to the bathroom at night not attributable to dizziness. She has not missed any doses of Sprycel . [name] does not measure blood pressure at home.   She finished tamoxifen  in December 2024. She received 1 prolia  injection with Dr. Del Favia.   PAST MEDICAL HISTORY:   Past Medical History: 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   Complication of anesthesia    GERD (gastroesophageal reflux disease)    Hemorrhoids    Hypercholesterolemia    Polymyalgia rheumatica (HCC)    PONV (postoperative nausea and vomiting)    S/P colonoscopy 06/14/2004   friable internal and external hemorrhoids, left-sided diverticula   S/P endoscopy 08/13/2006   pale 1-3 mm nodules consistent with benign squamous papilloma, tiny hiatal hernia   Serrated adenoma of colon     Surgical History: Past Surgical History:  Procedure Laterality Date   ABDOMINAL HYSTERECTOMY     BREAST BIOPSY Right 02/2018    ductal carcinoma with a papillary configuration   BREAST LUMPECTOMY Right 2019   ductal carcinoma with a papillary configuration   BREAST LUMPECTOMY WITH RADIOACTIVE SEED LOCALIZATION Right 03/12/2018   Procedure: RIGHT BREAST LUMPECTOMY WITH RADIOACTIVE SEED LOCALIZATION;  Surgeon: Boyce Byes, MD;  Location: [facility];  Service: General;  Laterality: Right;   CHOLECYSTECTOMY     COLONOSCOPY  11/20/2010   Dr. Riley Cheadle- serrated adenomaL side diverticulosis, hemorrhoids   COLONOSCOPY  06/14/2004   friable internal and external hemorrhoids, left-sided diverticula   COLONOSCOPY N/A 08/23/2016   Dr. Riley Cheadle: Diverticulosis, medium-sized grade 2 internal hemorrhoids.   ESOPHAGOGASTRODUODENOSCOPY  08/13/2006   pale 1-3 mm nodules consistent with benign squamous papilloma, tiny hiatal hernia   ESOPHAGOGASTRODUODENOSCOPY N/A 08/23/2016   Dr. Riley Cheadle: Normal   FLEXIBLE SIGMOIDOSCOPY  11/15/2011   Procedure: FLEXIBLE SIGMOIDOSCOPY;  Surgeon: Suzette Espy, MD;  Location: [facility];  Service: Endoscopy;  Laterality: N/A;  12:30PM   KNEE ARTHROSCOPY WITH LATERAL MENISECTOMY Right 03/13/2022   Procedure: KNEE ARTHROSCOPY WITH LATERAL MENISCECTOMY;  Surgeon: Darrin Emerald, MD;  Location: [facility];  Service: Orthopedics;  Laterality: Right;   WRIST FRACTURE SURGERY Right     Social History: Social History   Socioeconomic History   Marital status: Widowed    Spouse name: Not on file   Number of children: 2   Years of education: Not on file   Highest education level: Not on file  Occupational History   Not on file  Tobacco Use   Smoking status: Never   Smokeless tobacco: Never  Vaping Use   Vaping status: Never Used  Substance and Sexual Activity   Alcohol use: Yes    Alcohol/week: 2.0 standard drinks of alcohol    Types: 2 Glasses of wine per week    Comment: Drinks wine 2-3 days per week ( usually a couple of glasses each time)   Drug use: No   Sexual activity:  Not on file  Other Topics Concern   Not on file  Social History Narrative   Not on file   Social Drivers of Health   Financial Resource Strain: Low Risk  (03/08/2020)   Overall Financial Resource Strain (CARDIA)    Difficulty of Paying Living Expenses: Not hard at all  Food Insecurity: No Food Insecurity (03/08/2020)   Hunger Vital Sign    Worried About Running Out of Food in the Last Year: Never true    Ran Out of Food in the Last Year: Never true  Transportation Needs: No Transportation Needs (03/08/2020)   PRAPARE - Administrator, Civil Service (Medical): No    Lack of Transportation (Non-Medical): No  Physical Activity: Insufficiently Active (03/08/2020)   Exercise Vital Sign    Days of Exercise per Week: 7 days    Minutes of Exercise per Session: 20 min  Stress: No Stress Concern Present (03/08/2020)   [employer] of Occupational Health - Occupational Stress Questionnaire    Feeling of Stress : Not at all  Social Connections: Moderately Isolated (03/08/2020)   Social Connection and Isolation Panel [NHANES]    Frequency of Communication with Friends and Family: Twice a week    Frequency of Social Gatherings with Friends and Family: Twice a week    Attends Religious Services: More than 4 times per year    Active Member of [employer] or Organizations: No    Attends Banker Meetings: Never    Marital Status: Widowed  Intimate Partner Violence: Not At Risk (03/08/2020)   Humiliation, Afraid, Rape, and Kick questionnaire    Fear of Current or Ex-Partner: No    Emotionally Abused: No    Physically Abused: No    Sexually Abused: No    Family History: Family History  Problem Relation Age of Onset   Thyroid  cancer Mother    Parkinson's disease Sister    Thyroid  disease Brother    Aneurysm Sister    Thyroid  cancer Other    Thyroid  cancer Other    Colon cancer Neg Hx     Current Medications:  Current Outpatient Medications:    acetaminophen   (TYLENOL ) 500 MG tablet, Take 500-1,000 mg by mouth every 6 (six) hours as needed (pain.)., Disp: , Rfl:    Calcium Carb-Cholecalciferol (CALCIUM + VITAMIN D3 PO), Take by mouth., Disp: , Rfl:    dasatinib  (SPRYCEL ) 20 MG tablet, Take 1 tablet (20 mg  total) by mouth daily. Take Monday-Friday., Disp: 20 tablet, Rfl: 3   Magnesium 500 MG TABS, Take 500 mg by mouth in the morning., Disp: , Rfl:    Multiple Vitamin (MULTIVITAMIN WITH MINERALS) TABS tablet, Take 1 tablet by mouth in the morning. Centrum Silver, Disp: , Rfl:    MYRBETRIQ 25 MG TB24 tablet, Take 25 mg by mouth daily., Disp: , Rfl:    polyethylene glycol (MIRALAX / GLYCOLAX) packet, Take 17 g by mouth in the morning., Disp: , Rfl:    Allergies: Allergies  Allergen Reactions   Fish Allergy Nausea And Vomiting   Shrimp [Shellfish Allergy] Nausea And Vomiting    REVIEW OF SYSTEMS:   Review of Systems  Constitutional:  Negative for chills, fatigue and fever.  HENT:   Negative for lump/mass, mouth sores, nosebleeds, sore throat and trouble swallowing.   Eyes:  Negative for eye problems.  Respiratory:  Negative for cough and shortness of breath.   Cardiovascular:  Negative for chest pain, leg swelling and palpitations.  Gastrointestinal:  Negative for abdominal pain, constipation, diarrhea, nausea and vomiting.  Genitourinary:  Positive for bladder incontinence. Negative for difficulty urinating, dysuria, frequency, hematuria and nocturia.   Musculoskeletal:  Negative for arthralgias, back pain, flank pain, myalgias and neck pain.  Skin:  Negative for itching and rash.  Neurological:  Positive for dizziness. Negative for headaches and numbness.  Hematological:  Does not bruise/bleed easily.  Psychiatric/Behavioral:  Positive for sleep disturbance. Negative for depression and suicidal ideas. The patient is not nervous/anxious.   All other systems reviewed and are negative.    VITALS:   Blood pressure (!) 158/75, pulse 76,  temperature 98 F (36.7 C), temperature source Oral, resp. rate 18, weight 148 lb 13 oz (67.5 kg), SpO2 95%.  Wt Readings from Last 3 Encounters:  08/27/23 148 lb 13 oz (67.5 kg)  07/08/23 150 lb (68 kg)  04/23/23 147 lb (66.7 kg)    Body mass index is 22.63 kg/m.  Performance status (ECOG): 1 - Symptomatic but completely ambulatory  PHYSICAL EXAM:   Physical Exam Vitals and nursing note reviewed. Exam conducted with a chaperone present.  Constitutional:      Appearance: Normal appearance.  Cardiovascular:     Rate and Rhythm: Normal rate and regular rhythm.     Pulses: Normal pulses.     Heart sounds: Normal heart sounds.  Pulmonary:     Effort: Pulmonary effort is normal.     Breath sounds: Normal breath sounds.  Abdominal:     Palpations: Abdomen is soft. There is no hepatomegaly, splenomegaly or mass.     Tenderness: There is no abdominal tenderness.  Musculoskeletal:     Right lower leg: No edema.     Left lower leg: No edema.  Lymphadenopathy:     Cervical: No cervical adenopathy.     Right cervical: No superficial, deep or posterior cervical adenopathy.    Left cervical: No superficial, deep or posterior cervical adenopathy.     Upper Body:     Right upper body: No supraclavicular or axillary adenopathy.     Left upper body: No supraclavicular or axillary adenopathy.  Neurological:     General: No focal deficit present.     Mental Status: She is alert and oriented to person, place, and time.  Psychiatric:        Mood and Affect: Mood normal.        Behavior: Behavior normal.     LABS:  Latest Ref Rng & Units 08/13/2023   10:41 AM 04/10/2023    9:12 AM 01/11/2023    9:18 AM  CBC  WBC 4.0 - 10.5 K/uL 4.7  3.9  3.4   Hemoglobin 12.0 - 15.0 g/dL 16.1  09.6  04.5   Hematocrit 36.0 - 46.0 % 39.1  39.9  39.0   Platelets 150 - 400 K/uL 238  193  161       Latest Ref Rng & Units 08/13/2023   10:41 AM 04/10/2023    9:12 AM 01/11/2023    9:18 AM  CMP  Glucose  70 - 99 mg/dL 409  811  914   BUN 8 - 23 mg/dL 16  17  18    Creatinine 0.44 - 1.00 mg/dL 7.82  9.56  2.13   Sodium 135 - 145 mmol/L 138  137  137   Potassium 3.5 - 5.1 mmol/L 3.9  4.1  3.7   Chloride 98 - 111 mmol/L 103  107  103   CO2 22 - 32 mmol/L 26  21  24    Calcium 8.9 - 10.3 mg/dL 9.4  8.8  8.7   Total Protein 6.5 - 8.1 g/dL 7.3  6.9  6.7   Total Bilirubin 0.0 - 1.2 mg/dL 1.1  0.7  1.2   Alkaline Phos 38 - 126 U/L 44  26  26   AST 15 - 41 U/L 27  26  21    ALT 0 - 44 U/L 21  21  15       No results found for: "CEA1", "CEA" / No results found for: "CEA1", "CEA" No results found for: "PSA1" No results found for: "YQM578" No results found for: "CAN125"  No results found for: "TOTALPROTELP", "ALBUMINELP", "A1GS", "A2GS", "BETS", "BETA2SER", "GAMS", "MSPIKE", "SPEI" No results found for: "TIBC", "FERRITIN", "IRONPCTSAT" Lab Results  Component Value Date   LDH 173 08/13/2023   LDH 161 05/22/2021   LDH 166 02/21/2021     STUDIES:   No results found.

## 2023-09-09 ENCOUNTER — Other Ambulatory Visit: Payer: Self-pay

## 2023-09-09 NOTE — Progress Notes (Signed)
 Specialty Pharmacy Refill Coordination Note  [name] is a 89 y.o. female contacted today regarding refills of specialty medication(s) Dasatinib  (SPRYCEL )   Patient requested Delivery   Delivery date: 09/11/23   Verified address: [address] [place] [address]   Medication will be filled on 09/10/23.

## 2023-09-12 ENCOUNTER — Ambulatory Visit: Payer: Self-pay

## 2023-09-12 ENCOUNTER — Ambulatory Visit

## 2023-09-12 ENCOUNTER — Ambulatory Visit
Admission: EM | Admit: 2023-09-12 | Discharge: 2023-09-12 | Disposition: A | Discharge status: Home / Self Care | Attending: Family Medicine | Admitting: Family Medicine

## 2023-09-12 DIAGNOSIS — M1712 Unilateral primary osteoarthritis, left knee: Secondary | ICD-10-CM | POA: Diagnosis not present

## 2023-09-12 DIAGNOSIS — M25562 Pain in left knee: Secondary | ICD-10-CM | POA: Diagnosis not present

## 2023-09-12 DIAGNOSIS — M7989 Other specified soft tissue disorders: Secondary | ICD-10-CM | POA: Diagnosis not present

## 2023-09-12 MED ORDER — DEXAMETHASONE SODIUM PHOSPHATE 10 MG/ML IJ SOLN
10.0000 mg | Freq: Once | INTRAMUSCULAR | Status: AC
  Administered 2023-09-12: 10 mg via INTRAMUSCULAR

## 2023-09-12 NOTE — Discharge Instructions (Signed)
 We will let you know if anything comes back abnormal on your knee x-ray.  I am suspecting an inflammatory cause of your pain such as an arthritis flare.  We have given you a shot of steroid for pain and inflammation today and you may use Voltaren gel, over-the-counter pain relievers and knee braces as needed.  Follow-up with orthopedics if not resolving

## 2023-09-12 NOTE — ED Triage Notes (Signed)
 Pt reports she has left leg pain x 4 days  States it is hard for her to walk on that knee. Denies injury    Took ibuprofen 

## 2023-09-16 NOTE — ED Provider Notes (Signed)
 [facility]    CSN: [id number] Arrival date & time: 09/12/23  1844      History   Chief Complaint No chief complaint on file.   HPI [name] is a 89 y.o. female.   Patient presenting today with 4-day history of left leg pain mostly to the knee with sometimes radiating up toward left hip and low back.  Denies known injury to the area, swelling, bruising, redness, numbness, tingling, loss of range of motion.  Also denies bowel or bladder incontinence, saddle anesthesias, fevers, urinary symptoms.  So far trying ibuprofen  with good temporary relief of symptoms.  States has a known history of arthritis in her knees, had arthroscopic procedure done to the right knee several years ago and felt good relief from this.    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   Complication of anesthesia    GERD (gastroesophageal reflux disease)    Hemorrhoids    Hypercholesterolemia    Polymyalgia rheumatica (HCC)    PONV (postoperative nausea and vomiting)    S/P colonoscopy 06/14/2004   friable internal and external hemorrhoids, left-sided diverticula   S/P endoscopy 08/13/2006   pale 1-3 mm nodules consistent with benign squamous papilloma, tiny hiatal hernia   Serrated adenoma of colon     Patient Active Problem List   Diagnosis Date Noted   OP (osteoporosis) 04/03/2022   Acute lateral meniscal tear, right, sequela 03/13/2022   Pancytopenia, acquired (HCC) 11/16/2020   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   Leukocytosis 02/22/2020   Breast neoplasm, Tis (DCIS), 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   Hemorrhoids 11/22/2016   Hx of adenomatous colonic polyps 07/20/2016   Rectal bleeding 07/20/2016   Constipation 07/20/2016   GERD (gastroesophageal reflux disease) 07/20/2016   Heme + stool 10/30/2010    Past Surgical History:  Procedure Laterality Date   ABDOMINAL  HYSTERECTOMY     BREAST BIOPSY Right 02/2018   ductal carcinoma with a papillary configuration   BREAST LUMPECTOMY Right 2019   ductal carcinoma with a papillary configuration   BREAST LUMPECTOMY WITH RADIOACTIVE SEED LOCALIZATION Right 03/12/2018   Procedure: RIGHT BREAST LUMPECTOMY WITH RADIOACTIVE SEED LOCALIZATION;  Surgeon: Boyce Byes, MD;  Location: [facility];  Service: General;  Laterality: Right;   CHOLECYSTECTOMY     COLONOSCOPY  11/20/2010   Dr. Riley Cheadle- serrated adenomaL side diverticulosis, hemorrhoids   COLONOSCOPY  06/14/2004   friable internal and external hemorrhoids, left-sided diverticula   COLONOSCOPY N/A 08/23/2016   Dr. Riley Cheadle: Diverticulosis, medium-sized grade 2 internal hemorrhoids.   ESOPHAGOGASTRODUODENOSCOPY  08/13/2006   pale 1-3 mm nodules consistent with benign squamous papilloma, tiny hiatal hernia   ESOPHAGOGASTRODUODENOSCOPY N/A 08/23/2016   Dr. Riley Cheadle: Normal   FLEXIBLE SIGMOIDOSCOPY  11/15/2011   Procedure: FLEXIBLE SIGMOIDOSCOPY;  Surgeon: Suzette Espy, MD;  Location: [facility];  Service: Endoscopy;  Laterality: N/A;  12:30PM   KNEE ARTHROSCOPY WITH LATERAL MENISECTOMY Right 03/13/2022   Procedure: KNEE ARTHROSCOPY WITH LATERAL MENISCECTOMY;  Surgeon: Darrin Emerald, MD;  Location: [facility];  Service: Orthopedics;  Laterality: Right;   WRIST FRACTURE SURGERY Right     OB History   No obstetric history on file.      Home Medications    Prior to Admission medications   Medication Sig Start Date End Date Taking? Authorizing Provider  acetaminophen  (TYLENOL ) 500 MG tablet Take 500-1,000 mg  by mouth every 6 (six) hours as needed (pain.).    [provider]  Calcium Carb-Cholecalciferol (CALCIUM + VITAMIN D3 PO) Take by mouth.    [provider]  dasatinib  (SPRYCEL ) 20 MG tablet Take 1 tablet (20 mg total) by mouth daily. Take Monday-Friday. 06/10/23   Katragadda, Sreedhar, MD  Magnesium 500 MG TABS Take 500  mg by mouth in the morning.    [provider]  Multiple Vitamin (MULTIVITAMIN WITH MINERALS) TABS tablet Take 1 tablet by mouth in the morning. Centrum Silver    [provider]  MYRBETRIQ 25 MG TB24 tablet Take 25 mg by mouth daily. 04/15/23   [provider]  polyethylene glycol (MIRALAX / GLYCOLAX) packet Take 17 g by mouth in the morning.    [provider]    Family History Family History  Problem Relation Age of Onset   Thyroid  cancer Mother    Parkinson's disease Sister    Thyroid  disease Brother    Aneurysm Sister    Thyroid  cancer Other    Thyroid  cancer Other    Colon cancer Neg Hx     Social History Social History   Tobacco Use   Smoking status: Never   Smokeless tobacco: Never  Vaping Use   Vaping status: Never Used  Substance Use Topics   Alcohol use: Yes    Alcohol/week: 2.0 standard drinks of alcohol    Types: 2 Glasses of wine per week    Comment: Drinks wine 2-3 days per week ( usually a couple of glasses each time)   Drug use: No     Allergies   Fish allergy and Shrimp [shellfish allergy]   Review of Systems Review of Systems Per HPI  Physical Exam Triage Vital Signs ED Triage Vitals [09/12/23 1857]  Encounter Vitals Group     BP (!) 152/59     Systolic BP Percentile      Diastolic BP Percentile      Pulse Rate 75     Resp 16     Temp 98.2 F (36.8 C)     Temp Source Oral     SpO2 94 %     Weight      Height      Head Circumference      Peak Flow      Pain Score 0     Pain Loc      Pain Education      Exclude from Growth Chart    No data found.  Updated Vital Signs BP (!) 152/59 (BP Location: Right Arm)   Pulse 75   Temp 98.2 F (36.8 C) (Oral)   Resp 16   SpO2 94%   Visual Acuity Right Eye Distance:   Left Eye Distance:   Bilateral Distance:    Right Eye Near:   Left Eye Near:    Bilateral Near:     Physical Exam Vitals and nursing note reviewed.  Constitutional:       Appearance: Normal appearance. She is not ill-appearing.  HENT:     Head: Atraumatic.  Eyes:     Extraocular Movements: Extraocular movements intact.     Conjunctiva/sclera: Conjunctivae normal.  Cardiovascular:     Rate and Rhythm: Normal rate.  Pulmonary:     Effort: Pulmonary effort is normal.  Musculoskeletal:        General: Normal range of motion.     Cervical back: Normal range of motion and neck supple.  Comments: No midline spinal tenderness to palpation diffusely.  Mild tenderness to palpation to the left lateral lumbar musculature into left buttock.  Negative straight leg raise bilateral lower extremities.  Normal gait and range of motion throughout.  Negative McMurray's, drawer testing to the left knee and no appreciable edema, discoloration  Skin:    General: Skin is warm and dry.  Neurological:     Mental Status: She is alert and oriented to person, place, and time.     Motor: No weakness.     Gait: Gait normal.     Comments: Left lower extremity neurovascularly intact  Psychiatric:        Mood and Affect: Mood normal.        Thought Content: Thought content normal.        Judgment: Judgment normal.      UC Treatments / Results  Labs (all labs ordered are listed, but only abnormal results are displayed) Labs Reviewed - No data to display  EKG   Radiology No results found.  Procedures Procedures (including critical care time)  Medications Ordered in UC Medications  dexamethasone  (DECADRON ) injection 10 mg (10 mg Intramuscular Given 09/12/23 1957)    Initial Impression / Assessment and Plan / UC Course  I have reviewed the triage vital signs and the nursing notes.  Pertinent labs & imaging results that were available during my care of the patient were reviewed by me and considered in my medical decision making (see chart for details).     Suspect arthritic/inflammatory cause of knee and left leg pain.  Very low suspicion for bony abnormality but  will obtain x-ray of the left knee per patient request.  This was negative for acute bony abnormality but does show degenerative changes as suspected.  Will treat with IM Decadron , RICE protocol, over-the-counter pain relievers as needed.  Ortho follow-up if not resolving.  Final Clinical Impressions(s) / UC Diagnoses   Final diagnoses:  Acute pain of left knee     Discharge Instructions      We will let you know if anything comes back abnormal on your knee x-ray.  I am suspecting an inflammatory cause of your pain such as an arthritis flare.  We have given you a shot of steroid for pain and inflammation today and you may use Voltaren gel, over-the-counter pain relievers and knee braces as needed.  Follow-up with orthopedics if not resolving  ED Prescriptions   None    PDMP not reviewed this encounter.   Corbin Dess, [place] 09/16/23 1623

## 2023-10-01 ENCOUNTER — Other Ambulatory Visit: Payer: Self-pay

## 2023-10-01 ENCOUNTER — Other Ambulatory Visit: Payer: Self-pay | Admitting: Hematology

## 2023-10-01 ENCOUNTER — Other Ambulatory Visit: Payer: Self-pay | Admitting: Pharmacy Technician

## 2023-10-01 ENCOUNTER — Other Ambulatory Visit (HOSPITAL_COMMUNITY): Payer: Self-pay

## 2023-10-01 DIAGNOSIS — C9210 Chronic myeloid leukemia, BCR/ABL-positive, not having achieved remission: Secondary | ICD-10-CM

## 2023-10-01 MED ORDER — DASATINIB 20 MG PO TABS
20.0000 mg | ORAL_TABLET | Freq: Every day | ORAL | 3 refills | Status: DC
  Filled 2023-10-01 – 2023-10-03 (×2): qty 20, 28d supply, fill #0
  Filled 2023-10-28: qty 20, 28d supply, fill #1
  Filled 2023-11-29: qty 20, 28d supply, fill #2
  Filled 2023-12-17: qty 20, 28d supply, fill #3

## 2023-10-01 NOTE — Progress Notes (Signed)
 Specialty Pharmacy Ongoing Clinical Assessment Note  [name] is a 89 y.o. female who is being followed by the specialty pharmacy service for RxSp Oncology   Patient's specialty medication(s) reviewed today: Dasatinib  (SPRYCEL )   Missed doses in the last 4 weeks: 0   Patient/Caregiver did not have any additional questions or concerns.   Therapeutic benefit summary: Patient is achieving benefit   Adverse events/side effects summary: No adverse events/side effects   Patient's therapy is appropriate to: Continue    Goals Addressed             This Visit's Progress    Achieve or maintain remission   On track    Patient is on track. Patient will maintain adherence.  Per office visit notes from 08/27/23, BCR/ABL by quantitative PCR shows B2 A2 transcript at 0.0053%.         Follow up: 3 months  Malachi Screws Specialty Pharmacist

## 2023-10-01 NOTE — Telephone Encounter (Signed)
 SPRYCEL  refill approved. Patient is tolerating and is to continue taking Monday-Friday.

## 2023-10-01 NOTE — Progress Notes (Signed)
 Specialty Pharmacy Refill Coordination Note  [name] is a 89 y.o. female contacted today regarding refills of specialty medication(s) Dasatinib  (SPRYCEL )   Patient requested Delivery   Delivery date: 10/04/23   Verified address: [address]  [place]   Medication will be filled on 10/03/23.  This fill date is pending response to refill request from provider. Patient is aware and if they have not received fill by intended date they must follow up with pharmacy.

## 2023-10-03 ENCOUNTER — Other Ambulatory Visit: Payer: Self-pay

## 2023-10-03 ENCOUNTER — Other Ambulatory Visit (HOSPITAL_COMMUNITY): Payer: Self-pay

## 2023-10-08 DIAGNOSIS — R7301 Impaired fasting glucose: Secondary | ICD-10-CM | POA: Diagnosis not present

## 2023-10-08 DIAGNOSIS — R809 Proteinuria, unspecified: Secondary | ICD-10-CM | POA: Diagnosis not present

## 2023-10-08 DIAGNOSIS — E782 Mixed hyperlipidemia: Secondary | ICD-10-CM | POA: Diagnosis not present

## 2023-10-14 DIAGNOSIS — N3941 Urge incontinence: Secondary | ICD-10-CM | POA: Diagnosis not present

## 2023-10-14 DIAGNOSIS — M4316 Spondylolisthesis, lumbar region: Secondary | ICD-10-CM | POA: Diagnosis not present

## 2023-10-14 DIAGNOSIS — C9210 Chronic myeloid leukemia, BCR/ABL-positive, not having achieved remission: Secondary | ICD-10-CM | POA: Diagnosis not present

## 2023-10-14 DIAGNOSIS — M810 Age-related osteoporosis without current pathological fracture: Secondary | ICD-10-CM | POA: Diagnosis not present

## 2023-10-14 DIAGNOSIS — R7301 Impaired fasting glucose: Secondary | ICD-10-CM | POA: Diagnosis not present

## 2023-10-14 DIAGNOSIS — E782 Mixed hyperlipidemia: Secondary | ICD-10-CM | POA: Diagnosis not present

## 2023-10-14 DIAGNOSIS — R809 Proteinuria, unspecified: Secondary | ICD-10-CM | POA: Diagnosis not present

## 2023-10-14 DIAGNOSIS — R4189 Other symptoms and signs involving cognitive functions and awareness: Secondary | ICD-10-CM | POA: Diagnosis not present

## 2023-10-14 DIAGNOSIS — Z853 Personal history of malignant neoplasm of breast: Secondary | ICD-10-CM | POA: Diagnosis not present

## 2023-10-14 DIAGNOSIS — R944 Abnormal results of kidney function studies: Secondary | ICD-10-CM | POA: Diagnosis not present

## 2023-10-14 DIAGNOSIS — S83281D Other tear of lateral meniscus, current injury, right knee, subsequent encounter: Secondary | ICD-10-CM | POA: Diagnosis not present

## 2023-10-14 DIAGNOSIS — M1712 Unilateral primary osteoarthritis, left knee: Secondary | ICD-10-CM | POA: Diagnosis not present

## 2023-10-16 ENCOUNTER — Encounter: Payer: PPO | Attending: Internal Medicine | Admitting: *Deleted

## 2023-10-16 VITALS — BP 150/68 | HR 84 | Temp 97.8°F | Resp 16

## 2023-10-16 DIAGNOSIS — M810 Age-related osteoporosis without current pathological fracture: Secondary | ICD-10-CM | POA: Diagnosis not present

## 2023-10-16 MED ORDER — DENOSUMAB 60 MG/ML ~~LOC~~ SOSY
60.0000 mg | PREFILLED_SYRINGE | Freq: Once | SUBCUTANEOUS | Status: AC
  Administered 2023-10-16: 60 mg via SUBCUTANEOUS

## 2023-10-16 NOTE — Progress Notes (Signed)
 Diagnosis: Osteoporosis  Provider:  Dwana Melena MD  Procedure: Injection  Prolia (Denosumab), Dose: 60 mg, Site: subcutaneous, Number of injections: 1  Injection Site(s): Right arm  Post Care: Observation period completed  Discharge: Condition: Good, Destination: Home . AVS Provided  Performed by:  Daleen Squibb, RN

## 2023-10-21 ENCOUNTER — Other Ambulatory Visit (HOSPITAL_COMMUNITY): Payer: Self-pay

## 2023-10-28 ENCOUNTER — Other Ambulatory Visit: Payer: Self-pay

## 2023-10-28 NOTE — Progress Notes (Signed)
 Specialty Pharmacy Refill Coordination Note  [name] is a 89 y.o. female contacted today regarding refills of specialty medication(s) Dasatinib  (SPRYCEL )   Patient requested Delivery   Delivery date: 11/05/23   Verified address: [address]  [place]   Medication will be filled on 11/04/23.

## 2023-11-04 ENCOUNTER — Other Ambulatory Visit: Payer: Self-pay

## 2023-11-14 ENCOUNTER — Other Ambulatory Visit: Payer: Self-pay

## 2023-11-29 ENCOUNTER — Other Ambulatory Visit: Payer: Self-pay | Admitting: Pharmacy Technician

## 2023-11-29 ENCOUNTER — Other Ambulatory Visit: Payer: Self-pay

## 2023-11-29 NOTE — Progress Notes (Signed)
 Specialty Pharmacy Refill Coordination Note  [name] is a 89 y.o. female contacted today regarding refills of specialty medication(s) Dasatinib  (SPRYCEL )   Patient requested Delivery   Delivery date: 12/02/23   Verified address: [address] [place] [address]   Medication will be filled on 11/29/23.

## 2023-12-02 ENCOUNTER — Other Ambulatory Visit: Payer: Self-pay

## 2023-12-17 ENCOUNTER — Other Ambulatory Visit (HOSPITAL_COMMUNITY): Payer: Self-pay

## 2023-12-17 ENCOUNTER — Other Ambulatory Visit: Payer: Self-pay

## 2023-12-17 NOTE — Progress Notes (Signed)
 Specialty Pharmacy Refill Coordination Note  [name] is a 89 y.o. female contacted today regarding refills of specialty medication(s) Dasatinib  (SPRYCEL )   Patient requested Delivery   Delivery date: 12/23/23   Verified address: [address] [place] [address]   Medication will be filled on 12/20/23.

## 2023-12-18 ENCOUNTER — Other Ambulatory Visit: Payer: Self-pay

## 2023-12-18 NOTE — Progress Notes (Signed)
 Specialty Pharmacy Ongoing Clinical Assessment Note  [name] is a 89 y.o. female who is being followed by the specialty pharmacy service for RxSp Oncology   Patient's specialty medication(s) reviewed today: Dasatinib  (SPRYCEL )   Missed doses in the last 4 weeks: 0   Patient/Caregiver did not have any additional questions or concerns.   Therapeutic benefit summary: Patient is achieving benefit   Adverse events/side effects summary: No adverse events/side effects   Patient's therapy is appropriate to: Continue    Goals Addressed             This Visit's Progress    Achieve or maintain remission   On track    Patient is on track. Patient will maintain adherence.  Per office visit notes from 08/27/23, BCR/ABL by quantitative PCR shows B2 A2 transcript at 0.0053%.         Follow up: 6 months  [facility]

## 2023-12-20 ENCOUNTER — Other Ambulatory Visit: Payer: Self-pay

## 2023-12-23 ENCOUNTER — Inpatient Hospital Stay: Attending: Oncology

## 2023-12-23 DIAGNOSIS — C9210 Chronic myeloid leukemia, BCR/ABL-positive, not having achieved remission: Secondary | ICD-10-CM | POA: Diagnosis not present

## 2023-12-23 LAB — LACTATE DEHYDROGENASE: LDH: 152 U/L (ref 98–192)

## 2023-12-23 LAB — COMPREHENSIVE METABOLIC PANEL WITH GFR
ALT: 14 U/L (ref 0–44)
AST: 21 U/L (ref 15–41)
Albumin: 4.2 g/dL (ref 3.5–5.0)
Alkaline Phosphatase: 39 U/L (ref 38–126)
Anion gap: 12 (ref 5–15)
BUN: 18 mg/dL (ref 8–23)
CO2: 21 mmol/L — ABNORMAL LOW (ref 22–32)
Calcium: 8.8 mg/dL — ABNORMAL LOW (ref 8.9–10.3)
Chloride: 105 mmol/L (ref 98–111)
Creatinine, Ser: 0.87 mg/dL (ref 0.44–1.00)
GFR, Estimated: >60 mL/min (ref >60)
Glucose, Bld: 96 mg/dL (ref 70–99)
Potassium: 4 mmol/L (ref 3.5–5.1)
Sodium: 138 mmol/L (ref 135–145)
Total Bilirubin: 1.1 mg/dL (ref 0.0–1.2)
Total Protein: 7.1 g/dL (ref 6.5–8.1)

## 2023-12-23 LAB — CBC WITH DIFFERENTIAL/PLATELET
Abs Immature Granulocytes: 0.02 K/uL (ref 0.00–0.07)
Basophils Absolute: 0 K/uL (ref 0.0–0.1)
Basophils Relative: 1 %
Eosinophils Absolute: 0.1 K/uL (ref 0.0–0.5)
Eosinophils Relative: 3 %
HCT: 40.5 % (ref 36.0–46.0)
Hemoglobin: 13.8 g/dL (ref 12.0–15.0)
Immature Granulocytes: 1 %
Lymphocytes Relative: 42 %
Lymphs Abs: 1.3 K/uL (ref 0.7–4.0)
MCH: 32.5 pg (ref 26.0–34.0)
MCHC: 34.1 g/dL (ref 30.0–36.0)
MCV: 95.3 fL (ref 80.0–100.0)
Monocytes Absolute: 0.3 K/uL (ref 0.1–1.0)
Monocytes Relative: 9 %
Neutro Abs: 1.3 K/uL — ABNORMAL LOW (ref 1.7–7.7)
Neutrophils Relative %: 44 %
Platelets: 197 K/uL (ref 150–400)
RBC: 4.25 MIL/uL (ref 3.87–5.11)
RDW: 13 % (ref 11.5–15.5)
WBC: 3 K/uL — ABNORMAL LOW (ref 4.0–10.5)
nRBC: 0 % (ref 0.0–0.2)

## 2023-12-25 LAB — BCR-ABL1, CML/ALL, PCR, QUANT
E1A2 Transcript: <0.0032 %
Interpretation (BCRAL):: NEGATIVE
b2a2 transcript: <0.0032 %
b3a2 transcript: <0.0032 %

## 2023-12-27 ENCOUNTER — Encounter: Payer: Self-pay | Admitting: Radiology

## 2024-01-07 ENCOUNTER — Inpatient Hospital Stay: Admitting: Oncology

## 2024-01-10 ENCOUNTER — Other Ambulatory Visit (HOSPITAL_COMMUNITY): Payer: Self-pay

## 2024-01-10 ENCOUNTER — Ambulatory Visit: Admitting: Oncology

## 2024-01-10 ENCOUNTER — Other Ambulatory Visit: Payer: Self-pay

## 2024-01-10 ENCOUNTER — Other Ambulatory Visit: Payer: Self-pay | Admitting: Oncology

## 2024-01-10 DIAGNOSIS — C9210 Chronic myeloid leukemia, BCR/ABL-positive, not having achieved remission: Secondary | ICD-10-CM

## 2024-01-10 MED ORDER — DASATINIB 20 MG PO TABS
20.0000 mg | ORAL_TABLET | Freq: Every day | ORAL | 3 refills | Status: DC
  Filled 2024-01-10: qty 20, 20d supply, fill #0
  Filled 2024-01-17: qty 20, 28d supply, fill #0
  Filled 2024-02-12: qty 20, 28d supply, fill #1
  Filled 2024-03-09: qty 20, 28d supply, fill #2
  Filled 2024-04-06: qty 20, 28d supply, fill #3

## 2024-01-10 NOTE — Telephone Encounter (Signed)
 Per last office note, patient to continue taking Dasatinib  20mg .  Refills sent.

## 2024-01-12 NOTE — Progress Notes (Signed)
 Patient Care Team: Shona Norleen PEDLAR, MD as PCP - General (Internal Medicine) Shaaron Lamar HERO, MD (Gastroenterology)  Clinic Day:  01/13/2024  Referring physician: Shona Norleen PEDLAR, MD   CHIEF COMPLAINT:  CC:  CML in chronic phase and Right breast DCIS  [name] 89 y.o. female was transferred to my care after her prior physician has left.   ASSESSMENT & PLAN:   Assessment & Plan: [name]  is a 89 y.o. female with CML in chronic phase and right breast DCIS  Assessment & Plan CML (chronic myeloid leukemia) (HCC) CML in chronic phase diagnosed in 2021 On Dasatinib  50 mg dose reduced to 20 mg daily Monday through Friday on 06/05/2021.  - Tolerating this dose of Dasatinib  20 mg Monday through Friday well.  Continue at this dose - Asymptomatic - Labs reviewed today: CMP: Normal creatinine, normal LFTs, CBC: Mild leukopenia but normal hemoglobin and platelets. -BCR able negative. -Patient reports some balance problems and dizziness.  Will keep an eye on the symptoms.  Return to clinic in 4 months with labs Breast neoplasm, Tis (DCIS), right Right breast DCIS s/p lumpectomy and 5 years of tamoxifen  completed in December 2024  - Recent mammogram from February 2025 was negative BI-RADS Category 1 - Continue yearly mammograms.  Due 06/2024 Age-related osteoporosis without current pathological fracture  She is continuing Prolia  injections at Dr. Milford office every 6 months.  Last dose 10/2023 Latest calcium level is 8.8  - Recommended taking calcium and vitamin D  supplementation    The patient understands the plans discussed today and is in agreement with them.  She knows to contact our office if she develops concerns prior to her next appointment.  40 minutes of total time was spent for this patient encounter, including preparation,review of records,  face-to-face counseling with the patient and coordination of care, physical exam, and documentation of the encounter.     [name],acting as a Neurosurgeon for Mickiel Dry, MD.,have documented all relevant documentation on the behalf of Mickiel Dry, MD,as directed by  Mickiel Dry, MD while in the presence of Mickiel Dry, MD.  I, Mickiel Dry MD, have reviewed the above documentation for accuracy and completeness, and I agree with the above.     Mickiel Dry, MD   CANCER CENTER [facility] [place] - A DEPT OF [facility] [address] [place] [address] Dept: [phone] Dept Fax: [phone]   Orders Placed This Encounter  Procedures   MM 3D SCREENING MAMMOGRAM BILATERAL BREAST    Standing Status:   Future    Expected Date:   06/15/2024    Expiration Date:   01/12/2025    Reason for Exam (SYMPTOM  OR DIAGNOSIS REQUIRED):   breast cancer screening    Preferred imaging location?:   [facility]   CBC with Differential    Standing Status:   Future    Expected Date:   05/11/2024    Expiration Date:   08/09/2024   Comprehensive metabolic panel    Standing Status:   Future    Expected Date:   05/11/2024    Expiration Date:   08/09/2024   Lactate dehydrogenase    Standing Status:   Future    Expected Date:   05/11/2024    Expiration Date:   08/09/2024   BCR-ABL1, CML/ALL, PCR, QUANT    Standing Status:   Future    Expected Date:   05/11/2024  Expiration Date:   08/09/2024     ONCOLOGY HISTORY:   I have reviewed her chart and materials related to her cancer extensively and collaborated history with the patient. Summary of oncologic history is as follows:   Diagnosis:  CML in chronic phase   -Presentation: leukocytosis -02/16/2020: Peripheral blood smear.  Pathology: Left shifted neutrophilia with eosinophilia and basophilia including occasional blasts.  -02/23/2020: BCR/ABL1 FISH: Abnormal: 77% of nuclei positive for BCR/ABL1 gene fusion signals -02/23/2020: Peripheral flow cytometry: - Findings suggestive of CML. Immature  myeloid population present. There is <1% CD34-positive cells, but there is a population of cells (4% of cells) within the blast gate that express CD123, CD13, CD38, and CD11b. BCR/ABL by FISH positive for fusion protein (77%).  -03/10/2020: Bone Marrow Biopsy.  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    Diagnosis: Right breast DCIS  -02/03/2018: Screening Mammogram: Possible right breast mass. -02/11/2018: US  of right breast: Indeterminate mass in 3:30 o'clock location of the RIGHT breast, measuring 0.5 x 0.4 x 0.5 centimeters.  -02/18/2018: Right breast mass biopsy.  Pathology: Ductal carcinoma with papillary configuration. Tumor cells are ER positive (95%) and PR positive (95%). Her2 testing not done.  -03/12/2018: Right breast lumpectomy.  Pathology: DCIS, intermediate grade, 0.6 cm with area showing papillary configuration. No evidence of invasion. -04/09/2018-04/2023: 5 years of Tamoxifen  completed, continue annual mammograms  Current Treatment:  Dasatinib  20 mg daily Monday-friday  INTERVAL HISTORY:   [name] is here today for follow up and to establish care with me for [facility] and right breast DCIS.   She reports that she is not experiencing any side effects from her medication. No diarrhea, nausea, vomiting, fever, chills, or weight loss. She notes a slight loss of appetite and has recently  started experiencing night sweats, which occur sporadically, approximately once a week to once a month.  She is concerned about her memory, experiencing significant forgetfulness over the past month, described as not remembering 'what I'm doing one minute to the next.' This issue has been present for less than a year and is impacting her daily life.  She reports having fallen a couple of times without any preceding dizziness or warning signs. She describes an incident where she fell while standing at the refrigerator, holding onto the door handle, which occurred two to three weeks ago. She did not sustain any injuries and did not seek medical attention. She now uses a crutch occasionally to help with balance. She denies any alcohol use.  She mentions feeling good most of the time and enjoys working in her yard and being outside, although she notes the weather is getting colder.  I have reviewed the past medical history, past surgical history, social history and family history with the patient and they are unchanged from previous note.  ALLERGIES:  is allergic to fish allergy and shrimp [shellfish allergy].  MEDICATIONS:  Current Outpatient Medications  Medication Sig Dispense Refill   acetaminophen  (TYLENOL ) 500 MG tablet Take 500-1,000 mg by mouth every 6 (six) hours  as needed (pain.).     Calcium Carb-Cholecalciferol (CALCIUM + VITAMIN D3 PO) Take by mouth.     dasatinib  (SPRYCEL ) 20 MG tablet Take 1 tablet (20 mg total) by mouth daily. Take Monday-Friday. 20 tablet 3   Magnesium 500 MG TABS Take 500 mg by mouth in the morning.     Multiple Vitamin (MULTIVITAMIN WITH MINERALS) TABS tablet Take 1 tablet by mouth in the morning. Centrum Silver     MYRBETRIQ 25 MG TB24 tablet Take 25 mg by mouth daily.     polyethylene glycol (MIRALAX / GLYCOLAX) packet Take 17 g by mouth in the morning.     No current facility-administered medications for this visit.    REVIEW OF SYSTEMS:    Constitutional: Denies fevers, chills or abnormal weight loss Eyes: Denies blurriness of vision Ears, nose, mouth, throat, and face: Denies mucositis or sore throat Respiratory: Denies cough, dyspnea or wheezes Cardiovascular: Denies palpitation, chest discomfort or lower extremity swelling Gastrointestinal:  Denies nausea, heartburn or change in bowel habits Skin: Denies abnormal skin rashes Lymphatics: Denies new lymphadenopathy or easy bruising Neurological:Denies numbness, tingling or new weaknesses Behavioral/Psych: Mood is stable, no new changes  All other systems were reviewed with the patient and are negative.   VITALS:  Blood pressure (!) 130/55, pulse 77, temperature 98.2 F (36.8 C), temperature source Oral, resp. rate 18, weight 153 lb 6.4 oz (69.6 kg), SpO2 96%.  Wt Readings from Last 3 Encounters:  01/13/24 153 lb 6.4 oz (69.6 kg)  08/27/23 148 lb 13 oz (67.5 kg)  07/08/23 150 lb (68 kg)    Body mass index is 23.32 kg/m.  Performance status (ECOG): 2 - Symptomatic, <50% confined to bed  PHYSICAL EXAM:   GENERAL:alert, no distress and comfortable SKIN: skin color, texture, turgor are normal, no rashes or significant lesions LYMPH:  no palpable lymphadenopathy in the cervical, axillary or inguinal LUNGS: clear to auscultation and percussion with normal breathing effort HEART: regular rate & rhythm and no murmurs and no lower extremity edema ABDOMEN:abdomen soft, non-tender and normal bowel sounds Musculoskeletal:no cyanosis of digits and no clubbing  NEURO: alert & oriented x 3 with fluent speech, no focal motor/sensory deficits  LABORATORY DATA:  I have reviewed the data as listed   Lab Results  Component Value Date   WBC 3.0 (L) 12/23/2023   NEUTROABS 1.3 (L) 12/23/2023   HGB 13.8 12/23/2023   HCT 40.5 12/23/2023   MCV 95.3 12/23/2023   PLT 197 12/23/2023      Chemistry      Component Value Date/Time   NA 138 12/23/2023 0914   NA 138 09/13/2013  1842   K 4.0 12/23/2023 0914   K 4.1 09/13/2013 1842   CL 105 12/23/2023 0914   CL 106 09/13/2013 1842   CO2 21 (L) 12/23/2023 0914   CO2 29 09/13/2013 1842   BUN 18 12/23/2023 0914   BUN 16 09/13/2013 1842   CREATININE 0.87 12/23/2023 0914   CREATININE 0.64 09/13/2013 1842      Component Value Date/Time   CALCIUM 8.8 (L) 12/23/2023 0914   CALCIUM 8.8 09/13/2013 1842   ALKPHOS 39 12/23/2023 0914   AST 21 12/23/2023 0914   ALT 14 12/23/2023 0914   BILITOT 1.1 12/23/2023 0914       Latest Reference Range & Units 12/23/23 09:13  b2a2 transcript % <0.0032  b3a2 transcript % <0.0032  E1A2 Transcript % <0.0032  Interpretation (BCRAL):  Negative  Director Review [facility]):  Comment  References (BCRAL)  Comment  BCR-ABL1, CML/ALL, PCR, QUANT  Rpt (C)  (C): Corrected Rpt: View report in Results Review for more information   Latest Reference Range & Units 12/23/23 09:14  LDH 98 - 192 U/L 152   RADIOGRAPHIC STUDIES: I have personally reviewed the radiological images as listed and agreed with the findings in the report.  None new to review

## 2024-01-13 ENCOUNTER — Inpatient Hospital Stay: Attending: Oncology | Admitting: Oncology

## 2024-01-13 VITALS — BP 130/55 | HR 77 | Temp 98.2°F | Resp 18 | Wt 153.4 lb

## 2024-01-13 DIAGNOSIS — M810 Age-related osteoporosis without current pathological fracture: Secondary | ICD-10-CM | POA: Diagnosis not present

## 2024-01-13 DIAGNOSIS — D0511 Intraductal carcinoma in situ of right breast: Secondary | ICD-10-CM | POA: Diagnosis not present

## 2024-01-13 DIAGNOSIS — Z86000 Personal history of in-situ neoplasm of breast: Secondary | ICD-10-CM | POA: Diagnosis not present

## 2024-01-13 DIAGNOSIS — R2689 Other abnormalities of gait and mobility: Secondary | ICD-10-CM | POA: Diagnosis not present

## 2024-01-13 DIAGNOSIS — C9210 Chronic myeloid leukemia, BCR/ABL-positive, not having achieved remission: Secondary | ICD-10-CM | POA: Diagnosis not present

## 2024-01-13 DIAGNOSIS — R42 Dizziness and giddiness: Secondary | ICD-10-CM | POA: Diagnosis not present

## 2024-01-13 NOTE — Assessment & Plan Note (Addendum)
 She is continuing Prolia  injections at Dr. Milford office every 6 months.  Last dose 10/2023 Latest calcium level is 8.8  - Recommended taking calcium and vitamin D  supplementation

## 2024-01-13 NOTE — Assessment & Plan Note (Signed)
 Right breast DCIS s/p lumpectomy and 5 years of tamoxifen  completed in December 2024  - Recent mammogram from February 2025 was negative BI-RADS Category 1 - Continue yearly mammograms.  Due 06/2024

## 2024-01-13 NOTE — Progress Notes (Signed)
Patient is taking Sprycel as prescribed. She has not missed any doses and reports no side effects at this time.   

## 2024-01-13 NOTE — Patient Instructions (Signed)
 [facility] Discharge Instructions   You were seen and examined today by Dr. Davonna.  She reviewed the results of your lab work which are normal/stable.   We will see you back in 4 months. We will repeat lab work prior to this visit.    Return as scheduled.    Thank you for choosing [facility] at [facility] to provide your oncology and hematology care.  To afford each patient quality time with our provider, please arrive at least 15 minutes before your scheduled appointment time.   If you have a lab appointment with the Cancer Center please come in thru the Main Entrance and check in at the main information desk.  You need to re-schedule your appointment should you arrive 10 or more minutes late.  We strive to give you quality time with our providers, and arriving late affects you and other patients whose appointments are after yours.  Also, if you no show three or more times for appointments you may be dismissed from the clinic at the providers discretion.     Again, thank you for choosing [facility].  Our hope is that these requests will decrease the amount of time that you wait before being seen by our physicians.       _____________________________________________________________  Should you have questions after your visit to [facility], please contact our office at [phone] and follow the prompts.  Our office hours are 8:00 a.m. and 4:30 p.m. Monday - Friday.  Please note that voicemails left after 4:00 p.m. may not be returned until the following business day.  We are closed weekends and major holidays.  You do have access to a nurse 24-7, just call the main number to the clinic [phone] and do not press any options, hold on the line and a nurse will answer the phone.    For prescription refill requests, have your pharmacy contact our office and allow 72 hours.    Due to Covid, you will  need to wear a mask upon entering the hospital. If you do not have a mask, a mask will be given to you at the Main Entrance upon arrival. For doctor visits, patients may have 1 support person age 69 or older with them. For treatment visits, patients can not have anyone with them due to social distancing guidelines and our immunocompromised population.

## 2024-01-14 ENCOUNTER — Other Ambulatory Visit: Payer: Self-pay

## 2024-01-17 ENCOUNTER — Other Ambulatory Visit: Payer: Self-pay | Admitting: Pharmacy Technician

## 2024-01-17 ENCOUNTER — Other Ambulatory Visit: Payer: Self-pay

## 2024-01-17 NOTE — Progress Notes (Signed)
 Specialty Pharmacy Refill Coordination Note  [name] is a 89 y.o. female contacted today regarding refills of specialty medication(s) Dasatinib  (SPRYCEL )   Patient requested Delivery   Delivery date: 01/21/24   Verified address: [address]  [place]   Medication will be filled on 01/20/24.

## 2024-01-20 ENCOUNTER — Other Ambulatory Visit: Payer: Self-pay

## 2024-02-10 ENCOUNTER — Other Ambulatory Visit (HOSPITAL_COMMUNITY): Payer: Self-pay

## 2024-02-12 ENCOUNTER — Other Ambulatory Visit: Payer: Self-pay

## 2024-02-12 NOTE — Progress Notes (Signed)
 Specialty Pharmacy Refill Coordination Note  [name] is a 89 y.o. female contacted today regarding refills of specialty medication(s) Dasatinib  (SPRYCEL )   Patient requested Delivery   Delivery date: 02/14/24   Verified address: [address]  [place]    Medication will be filled on 02/13/24.

## 2024-03-09 ENCOUNTER — Other Ambulatory Visit (HOSPITAL_COMMUNITY): Payer: Self-pay

## 2024-03-09 ENCOUNTER — Encounter: Payer: Self-pay | Admitting: Radiology

## 2024-03-11 ENCOUNTER — Other Ambulatory Visit (HOSPITAL_COMMUNITY): Payer: Self-pay

## 2024-03-12 ENCOUNTER — Other Ambulatory Visit: Payer: Self-pay | Admitting: Pharmacy Technician

## 2024-03-12 ENCOUNTER — Other Ambulatory Visit: Payer: Self-pay

## 2024-03-12 NOTE — Progress Notes (Signed)
 Specialty Pharmacy Refill Coordination Note  [name] is a 89 y.o. female contacted today regarding refills of specialty medication(s) Dasatinib  (SPRYCEL )   Patient requested Delivery   Delivery date: 03/17/24   Verified address: [address]  [place]   Medication will be filled on: 03/16/24

## 2024-03-16 ENCOUNTER — Other Ambulatory Visit: Payer: Self-pay

## 2024-03-23 ENCOUNTER — Other Ambulatory Visit: Payer: Self-pay

## 2024-03-24 ENCOUNTER — Other Ambulatory Visit (HOSPITAL_COMMUNITY): Payer: Self-pay | Admitting: Internal Medicine

## 2024-03-24 ENCOUNTER — Telehealth: Payer: Self-pay

## 2024-03-24 NOTE — Telephone Encounter (Signed)
 Auth Submission: NO AUTH NEEDED Site of care: Site of care: [facility] Payer: [employer] advtg ppo Medication & CPT/J Code(s) submitted: Prolia  (Denosumab ) [id number] Diagnosis Code:  Route of submission (phone, fax, portal): portal Phone # Fax # Auth type: Buy/Bill PB Units/visits requested: 60mg , q36months x 2doses Reference number:[id number]  Approval from: 03/24/24 to 05/06/24

## 2024-04-06 ENCOUNTER — Other Ambulatory Visit: Payer: Self-pay

## 2024-04-08 ENCOUNTER — Other Ambulatory Visit: Payer: Self-pay

## 2024-04-08 DIAGNOSIS — R7301 Impaired fasting glucose: Secondary | ICD-10-CM | POA: Diagnosis not present

## 2024-04-08 DIAGNOSIS — E782 Mixed hyperlipidemia: Secondary | ICD-10-CM | POA: Diagnosis not present

## 2024-04-08 DIAGNOSIS — R809 Proteinuria, unspecified: Secondary | ICD-10-CM | POA: Diagnosis not present

## 2024-04-08 NOTE — Progress Notes (Signed)
 Specialty Pharmacy Refill Coordination Note  [name] is a 89 y.o. female contacted today regarding refills of specialty medication(s) Dasatinib  (SPRYCEL )   Patient requested Delivery   Delivery date: 04/13/24   Verified address: [address]  [place]   Medication will be filled on: 04/10/24

## 2024-04-09 ENCOUNTER — Other Ambulatory Visit: Payer: Self-pay

## 2024-04-17 ENCOUNTER — Encounter: Admitting: *Deleted

## 2024-04-17 VITALS — BP 143/65 | HR 71 | Temp 97.5°F | Resp 18

## 2024-04-17 DIAGNOSIS — M810 Age-related osteoporosis without current pathological fracture: Secondary | ICD-10-CM | POA: Diagnosis not present

## 2024-04-17 MED ORDER — DENOSUMAB 60 MG/ML ~~LOC~~ SOSY
60.0000 mg | PREFILLED_SYRINGE | Freq: Once | SUBCUTANEOUS | Status: AC
  Administered 2024-04-17: 60 mg via SUBCUTANEOUS

## 2024-04-17 NOTE — Progress Notes (Signed)
 Diagnosis: Osteoporosis  Provider:  Hall, Zack MD  Procedure: Injection  Prolia  (Denosumab ), Dose: 60 mg, Site: subcutaneous, Number of injections: 1  Injection Site(s): Right arm  Post Care: Observation period completed  Discharge: Condition: Good, Destination: Home . AVS Declined  Performed by:  Baldwin Darice Helling, RN

## 2024-04-29 ENCOUNTER — Other Ambulatory Visit: Payer: Self-pay | Admitting: Oncology

## 2024-04-29 ENCOUNTER — Other Ambulatory Visit: Payer: Self-pay

## 2024-04-29 DIAGNOSIS — C9210 Chronic myeloid leukemia, BCR/ABL-positive, not having achieved remission: Secondary | ICD-10-CM

## 2024-05-01 ENCOUNTER — Other Ambulatory Visit (HOSPITAL_COMMUNITY): Payer: Self-pay

## 2024-05-01 ENCOUNTER — Other Ambulatory Visit: Payer: Self-pay | Admitting: Oncology

## 2024-05-01 DIAGNOSIS — C9210 Chronic myeloid leukemia, BCR/ABL-positive, not having achieved remission: Secondary | ICD-10-CM

## 2024-05-04 ENCOUNTER — Encounter: Payer: Self-pay | Admitting: *Deleted

## 2024-05-04 ENCOUNTER — Other Ambulatory Visit: Payer: Self-pay

## 2024-05-04 ENCOUNTER — Encounter: Payer: Self-pay | Admitting: Internal Medicine

## 2024-05-04 ENCOUNTER — Telehealth: Payer: Self-pay | Admitting: Pharmacy Technician

## 2024-05-04 MED ORDER — DASATINIB 20 MG PO TABS
20.0000 mg | ORAL_TABLET | Freq: Every day | ORAL | 3 refills | Status: AC
  Filled 2024-05-04: qty 20, 20d supply, fill #0
  Filled 2024-05-05: qty 20, 28d supply, fill #0
  Filled 2024-05-06: qty 20, 28d supply, fill #1
  Filled 2024-05-08: qty 20, 28d supply, fill #0
  Filled 2024-06-01 – 2024-06-10 (×2): qty 20, 28d supply, fill #1

## 2024-05-04 NOTE — Telephone Encounter (Signed)
 Chart reviewed. Sprycel  refilled per last office note with Dr. Davonna.

## 2024-05-04 NOTE — Telephone Encounter (Signed)
 Oral Oncology Patient Advocate Encounter  Called to refill medication before her grant expires.  No answer, lvm to call back.   Halei Hanover ([name]) [name], CPhT  [facility], [name], [facility] - Oral Chemotherapy Patient Advocate Specialist III Phone: [phone]  Fax: [phone]

## 2024-05-05 ENCOUNTER — Other Ambulatory Visit: Payer: Self-pay

## 2024-05-06 ENCOUNTER — Other Ambulatory Visit: Payer: Self-pay

## 2024-05-06 NOTE — Progress Notes (Signed)
 Package to be returned to [facility]. Bryan aware to send back to [facility]. Per Alyson need to reprocess after 1/1 due to grant and re-ship. Alyson to reach out to patient.

## 2024-05-06 NOTE — Progress Notes (Signed)
 Specialty Pharmacy Refill Coordination Note  [name] is a 89 y.o. female contacted today regarding refills of specialty medication(s) Dasatinib  (SPRYCEL )   Patient requested Delivery   Delivery date: 05/08/24   Verified address: [address]  [place]   Medication will be filled on: 05/06/24

## 2024-05-08 ENCOUNTER — Telehealth: Payer: Self-pay | Admitting: Pharmacy Technician

## 2024-05-08 ENCOUNTER — Other Ambulatory Visit (HOSPITAL_COMMUNITY): Payer: Self-pay | Admitting: Internal Medicine

## 2024-05-08 ENCOUNTER — Other Ambulatory Visit: Payer: Self-pay

## 2024-05-08 ENCOUNTER — Other Ambulatory Visit (HOSPITAL_COMMUNITY): Payer: Self-pay

## 2024-05-08 DIAGNOSIS — M810 Age-related osteoporosis without current pathological fracture: Secondary | ICD-10-CM

## 2024-05-08 NOTE — Telephone Encounter (Signed)
 Oral Oncology Patient Advocate Encounter  No grant funding available pt will need to do PAP.  Remigio Mcmillon ([name]) [name], CPhT  [facility], [name], [facility] - Oral Chemotherapy Patient Advocate Specialist III Phone: [phone]  Fax: [phone]

## 2024-05-11 ENCOUNTER — Other Ambulatory Visit

## 2024-05-11 ENCOUNTER — Inpatient Hospital Stay: Attending: Oncology

## 2024-05-11 DIAGNOSIS — C9210 Chronic myeloid leukemia, BCR/ABL-positive, not having achieved remission: Secondary | ICD-10-CM

## 2024-05-11 LAB — CBC WITH DIFFERENTIAL/PLATELET
Abs Immature Granulocytes: 0.01 K/uL (ref 0.00–0.07)
Basophils Absolute: 0 K/uL (ref 0.0–0.1)
Basophils Relative: 1 %
Eosinophils Absolute: 0.1 K/uL (ref 0.0–0.5)
Eosinophils Relative: 2 %
HCT: 38.9 % (ref 36.0–46.0)
Hemoglobin: 13.2 g/dL (ref 12.0–15.0)
Immature Granulocytes: 0 %
Lymphocytes Relative: 40 %
Lymphs Abs: 1.9 K/uL (ref 0.7–4.0)
MCH: 32.2 pg (ref 26.0–34.0)
MCHC: 33.9 g/dL (ref 30.0–36.0)
MCV: 94.9 fL (ref 80.0–100.0)
Monocytes Absolute: 0.3 K/uL (ref 0.1–1.0)
Monocytes Relative: 6 %
Neutro Abs: 2.4 K/uL (ref 1.7–7.7)
Neutrophils Relative %: 51 %
Platelets: 212 K/uL (ref 150–400)
RBC: 4.1 MIL/uL (ref 3.87–5.11)
RDW: 12.9 % (ref 11.5–15.5)
WBC: 4.6 K/uL (ref 4.0–10.5)
nRBC: 0 % (ref 0.0–0.2)

## 2024-05-11 LAB — COMPREHENSIVE METABOLIC PANEL WITH GFR
ALT: 13 U/L (ref 0–44)
AST: 22 U/L (ref 15–41)
Albumin: 4.5 g/dL (ref 3.5–5.0)
Alkaline Phosphatase: 46 U/L (ref 38–126)
Anion gap: 14 (ref 5–15)
BUN: 22 mg/dL (ref 8–23)
CO2: 22 mmol/L (ref 22–32)
Calcium: 9.2 mg/dL (ref 8.9–10.3)
Chloride: 105 mmol/L (ref 98–111)
Creatinine, Ser: 0.88 mg/dL (ref 0.44–1.00)
GFR, Estimated: >60 mL/min
Glucose, Bld: 118 mg/dL — ABNORMAL HIGH (ref 70–99)
Potassium: 4.2 mmol/L (ref 3.5–5.1)
Sodium: 141 mmol/L (ref 135–145)
Total Bilirubin: 0.8 mg/dL (ref 0.0–1.2)
Total Protein: 6.8 g/dL (ref 6.5–8.1)

## 2024-05-11 LAB — LACTATE DEHYDROGENASE: LDH: 214 U/L (ref 105–235)

## 2024-05-15 ENCOUNTER — Telehealth: Payer: Self-pay | Admitting: Pharmacy Technician

## 2024-05-15 ENCOUNTER — Other Ambulatory Visit (HOSPITAL_COMMUNITY): Payer: Self-pay

## 2024-05-15 NOTE — Telephone Encounter (Addendum)
 Oral Oncology Patient Advocate Encounter   Began application for assistance for Sprycel  through [employer].   Application will be submitted upon completion of necessary supporting documentation.   BMS phone number [phone]. BMS fax number [phone].   Pending patient income/signatures (form sent to pt's preferred email via DocuSign) and MD signatures (sent via fax to [facility]).   [name] ([name]) [name], CPhT  [facility], [name], [facility] - Oral Chemotherapy Patient Advocate Specialist III Phone: [phone]  Fax: [phone]

## 2024-05-18 ENCOUNTER — Ambulatory Visit: Admitting: Oncology

## 2024-05-19 LAB — BCR-ABL1, CML/ALL, PCR, QUANT
E1A2 Transcript: <0.0032 %
Interpretation (BCRAL):: NEGATIVE
b2a2 transcript: <0.0032 %
b3a2 transcript: <0.0032 %

## 2024-05-21 NOTE — Telephone Encounter (Signed)
 Oral Oncology Patient Advocate Encounter  I have received MD's signature.   I am waiting on patient's signatures and income, patient has an appointment scheduled for 05/25/2024 and knows to bring in required information and sign the application.   Application has been sent to Jon Shope to assist with obtaining missing required information/signatures.  [name] ([name]) [name], CPhT  [facility], [name], [facility] - Oral Chemotherapy Patient Advocate Specialist III Phone: [phone]  Fax: [phone]

## 2024-05-25 ENCOUNTER — Inpatient Hospital Stay: Admitting: Physician Assistant

## 2024-05-25 VITALS — BP 135/66 | HR 66 | Temp 98.1°F | Resp 18 | Wt 156.2 lb

## 2024-05-25 DIAGNOSIS — D0511 Intraductal carcinoma in situ of right breast: Secondary | ICD-10-CM | POA: Diagnosis not present

## 2024-05-25 DIAGNOSIS — C9210 Chronic myeloid leukemia, BCR/ABL-positive, not having achieved remission: Secondary | ICD-10-CM | POA: Diagnosis not present

## 2024-05-25 NOTE — Progress Notes (Signed)
 " [facility]  PROGRESS NOTE  Patient Care Team: Shona Norleen PEDLAR, MD as PCP - General (Internal Medicine) Shaaron Lamar HERO, MD (Gastroenterology)   CHIEF COMPLAINTS/PURPOSE OF CONSULTATION:  CML, chronic phase Right breast DCIS  ONCOLOGIC HISTORY: #CML in chronic phase  Presentation: leukocytosis 02/16/2020: Peripheral blood smear.  Pathology: Left shifted neutrophilia with eosinophilia and basophilia including occasional blasts.  02/23/2020: BCR/ABL1 FISH: Abnormal: 77% of nuclei positive for BCR/ABL1 gene fusion signals 02/23/2020: Peripheral flow cytometry: Findings suggestive of CML. Immature myeloid population present. There is <1% CD34-positive cells, but there is a population of cells (4% of cells) within the blast gate that express CD123, CD13, CD38, and CD11b. BCR/ABL by FISH positive for fusion protein (77%).  03/10/2020: Bone Marrow Biopsy.  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    #Right breast DCIS 02/03/2018: Screening Mammogram: Possible right breast mass. 02/11/2018: US  of right breast: Indeterminate mass in 3:30 o'clock location of the RIGHT breast, measuring 0.5 x 0.4 x 0.5 centimeters.  02/18/2018: Right breast mass biopsy.  Pathology: Ductal carcinoma with papillary configuration. Tumor cells are ER positive (95%) and PR positive (95%). Her2 testing not done.  03/12/2018:  Right breast lumpectomy.  Pathology: DCIS, intermediate grade, 0.6 cm with area showing papillary configuration. No evidence of invasion 04/09/2018-04/2023: 5 years of Tamoxifen  completed, continue annual mammograms  CURRENT TREATMENT: Dasatinib  20 mg M-F  INTERVAL HISTORY:  [name] 89 y.o. female reports that she is tolerating the treatment well without any new or concerning toxicities.  Her energy and appetite are overall stable.  She has noticed worsening left hip pain which is impacting her ambulation.  She is using a cane to assist with ambulation to prevent falls.  She denies nausea, vomiting or abdominal pain.  She does have some tingling in her hands which does not impact her dexterity or grip.  She denies fevers, chills, night sweats, shortness of breath, chest pain or cough.  She has no other complaints.  Rest of the 10 point ROS as below.  MEDICAL HISTORY:  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   Complication of anesthesia    GERD (gastroesophageal reflux disease)    Hemorrhoids    Hypercholesterolemia    Polymyalgia rheumatica    PONV (postoperative nausea and vomiting)    S/P colonoscopy 06/14/2004   friable internal and external hemorrhoids, left-sided diverticula   S/P endoscopy 08/13/2006   pale 1-3 mm nodules consistent with benign squamous papilloma, tiny hiatal hernia   Serrated adenoma of colon     SURGICAL HISTORY: Past Surgical History:  Procedure Laterality Date   ABDOMINAL HYSTERECTOMY     BREAST BIOPSY Right 02/2018   ductal carcinoma with a papillary configuration   BREAST LUMPECTOMY Right 2019   ductal carcinoma with a papillary configuration   BREAST LUMPECTOMY WITH RADIOACTIVE SEED LOCALIZATION Right 03/12/2018   Procedure: RIGHT BREAST LUMPECTOMY WITH RADIOACTIVE SEED  LOCALIZATION;  Surgeon: Gail Favorite, MD;  Location: [facility];  Service: General;  Laterality: Right;   CHOLECYSTECTOMY     COLONOSCOPY  11/20/2010   Dr. Shaaron- serrated adenomaL side diverticulosis, hemorrhoids   COLONOSCOPY  06/14/2004   friable internal and external hemorrhoids, left-sided diverticula   COLONOSCOPY N/A 08/23/2016   Dr. Shaaron: Diverticulosis, medium-sized grade 2 internal hemorrhoids.   ESOPHAGOGASTRODUODENOSCOPY  08/13/2006   pale 1-3 mm nodules consistent with benign squamous papilloma, tiny hiatal hernia   ESOPHAGOGASTRODUODENOSCOPY N/A 08/23/2016   Dr. Shaaron: Normal   FLEXIBLE SIGMOIDOSCOPY  11/15/2011   Procedure: FLEXIBLE SIGMOIDOSCOPY;  Surgeon: Lamar CHRISTELLA Shaaron, MD;  Location: [facility];  Service: Endoscopy;  Laterality: N/A;  12:30PM   KNEE ARTHROSCOPY WITH LATERAL MENISECTOMY Right 03/13/2022   Procedure: KNEE ARTHROSCOPY WITH LATERAL MENISCECTOMY;  Surgeon: Margrette Taft BRAVO, MD;  Location: [facility];  Service: Orthopedics;  Laterality: Right;   WRIST FRACTURE SURGERY Right     SOCIAL HISTORY: Social History   Socioeconomic History   Marital status: Widowed    Spouse name: Not on file   Number of children: 2   Years of education: Not on file   Highest education level: Not on file  Occupational History   Not on file  Tobacco Use   Smoking status: Never   Smokeless tobacco: Never  Vaping Use   Vaping status: Never Used  Substance and Sexual Activity   Alcohol use: Yes    Alcohol/week: 2.0 standard drinks of alcohol    Types: 2 Glasses of wine per week    Comment: Drinks wine 2-3 days per week ( usually a couple of glasses each time)   Drug use: No   Sexual activity: Not on file  Other Topics Concern   Not on file  Social History Narrative   Not on file   Social Drivers of Health   Tobacco Use: Low Risk (09/12/2023)   Patient History    Smoking Tobacco Use: Never    Smokeless Tobacco Use: Never    Passive Exposure: Not on file  Financial Resource Strain: Not on file  Food  Insecurity: Not on file  Transportation Needs: Not on file  Physical Activity: Not on file  Stress: Not on file  Social Connections: Not on file  Intimate Partner Violence: Not on file  Depression (PHQ2-9): Low Risk (05/25/2024)   Depression (PHQ2-9)    PHQ-2 Score: 0  Alcohol Screen: Not on file  Housing: Not on file  Utilities: Not on file  Health Literacy: Not on file    FAMILY HISTORY: Family History  Problem Relation Age of Onset   Thyroid  cancer Mother    Parkinson's disease Sister    Thyroid  disease Brother    Aneurysm Sister    Thyroid  cancer Other    Thyroid  cancer Other    Colon cancer Neg Hx     ALLERGIES:  is allergic to fish allergy and shrimp [shellfish allergy].  MEDICATIONS:  Current Outpatient Medications  Medication Sig Dispense Refill   fluticasone  (FLONASE ) 50 MCG/ACT nasal spray Place 1 spray into both nostrils 2 (two) times daily.     acetaminophen  (TYLENOL ) 500 MG tablet Take 500-1,000 mg by mouth every 6 (six) hours as needed (pain.).     Calcium Carb-Cholecalciferol (CALCIUM + VITAMIN D3 PO) Take by mouth.     dasatinib  (SPRYCEL ) 20 MG tablet Take 1 tablet (20 mg total) by mouth daily. Take Monday-Friday. 20 tablet 3   Magnesium 500  MG TABS Take 500 mg by mouth in the morning.     Multiple Vitamin (MULTIVITAMIN WITH MINERALS) TABS tablet Take 1 tablet by mouth in the morning. Centrum Silver     MYRBETRIQ 25 MG TB24 tablet Take 25 mg by mouth daily.     polyethylene glycol (MIRALAX / GLYCOLAX) packet Take 17 g by mouth in the morning.     No current facility-administered medications for this visit.    REVIEW OF SYSTEMS:   Constitutional: ( - ) fevers, ( - )  chills , ( - ) night sweats Eyes: ( - ) blurriness of vision, ( - ) double vision, ( - ) watery eyes Ears, nose, mouth, throat, and face: ( - ) mucositis, ( - ) sore throat Respiratory: ( - ) cough, ( - ) dyspnea, ( - ) wheezes Cardiovascular: ( - ) palpitation, ( - ) chest discomfort, ( - )  lower extremity swelling Gastrointestinal:  ( - ) nausea, ( - ) heartburn, ( - ) change in bowel habits Skin: ( - ) abnormal skin rashes Lymphatics: ( - ) new lymphadenopathy, ( - ) easy bruising Neurological: ( - ) numbness, ( - ) tingling, ( - ) new weaknesses Behavioral/Psych: ( - ) mood change, ( - ) new changes  All other systems were reviewed with the patient and are negative.  PHYSICAL EXAMINATION: ECOG PERFORMANCE STATUS: 1 - Symptomatic but completely ambulatory  Vitals:   05/25/24 1453  BP: 135/66  Pulse: 66  Resp: 18  Temp: 98.1 F (36.7 C)  SpO2: 97%   Filed Weights   05/25/24 1453  Weight: 156 lb 3.2 oz (70.9 kg)    GENERAL: well appearing female in NAD  SKIN: skin color, texture, turgor are normal, no rashes or significant lesions EYES: conjunctiva are pink and non-injected, sclera clear LUNGS: clear to auscultation and percussion with normal breathing effort HEART: regular rate & rhythm and no murmurs and no lower extremity edema Musculoskeletal: no cyanosis of digits and no clubbing  PSYCH: alert & oriented x 3, fluent speech NEURO: no focal motor/sensory deficits  LABORATORY DATA:  I have reviewed the data as listed    Latest Ref Rng & Units 05/11/2024    1:52 PM 12/23/2023    9:14 AM 08/13/2023   10:41 AM  CBC  WBC 4.0 - 10.5 K/uL 4.6  3.0  4.7   Hemoglobin 12.0 - 15.0 g/dL 86.7  86.1  87.1   Hematocrit 36.0 - 46.0 % 38.9  40.5  39.1   Platelets 150 - 400 K/uL 212  197  238        Latest Ref Rng & Units 05/11/2024    1:52 PM 12/23/2023    9:14 AM 08/13/2023   10:41 AM  CMP  Glucose 70 - 99 mg/dL 881  96  893   BUN 8 - 23 mg/dL 22  18  16    Creatinine 0.44 - 1.00 mg/dL 9.11  9.12  9.14   Sodium 135 - 145 mmol/L 141  138  138   Potassium 3.5 - 5.1 mmol/L 4.2  4.0  3.9   Chloride 98 - 111 mmol/L 105  105  103   CO2 22 - 32 mmol/L 22  21  26    Calcium 8.9 - 10.3 mg/dL 9.2  8.8  9.4   Total Protein 6.5 - 8.1 g/dL 6.8  7.1  7.3   Total Bilirubin 0.0 -  1.2 mg/dL 0.8  1.1  1.1  Alkaline Phos 38 - 126 U/L 46  39  44   AST 15 - 41 U/L 22  21  27    ALT 0 - 44 U/L 13  14  21       ASSESSMENT & PLAN [name] is a 89 y.o. female who presents to the clinic for continued management of CML and history of right breast DCIS.   #CML, chronic phase: --Oncologic history as above --Currently on Dasatinib  20 mg M-F, tolerating well.  --Labs from 05/11/2024 reviewed. WBC 4.6, Hgb 13.2, Plt 212, creatinine and LFTs are normal. BCR/ABL Quant is negative.  --Continue with current dose of Dasatinib  without any dose modifications --RTC in 4 months with repeat labs  #Right breast DCIS: --S/p lumpectomy and 5 years of tamoxifen  completed in December 2024  --Most recent mammogram from February 2025 was negative BI-RADS Category 1.  --Next mammogram is due on 06/15/2024.   #Osteoporosis: --Currently receiving Prolia  injections with PCP q 6 months, last dose was 04/17/2024. Calcium level is 9.2 on 05/11/24.  --Due to ongoing left hip pain, advised to follow up with PCP to obtain xrays.     No orders of the defined types were placed in this encounter.   All questions were answered. The patient knows to call the clinic with any problems, questions or concerns.  I have spent a total of 30 minutes minutes of face-to-face and non-face-to-face time, preparing to see the patient, performing a medically appropriate examination, counseling and educating the patient, documenting clinical information in the electronic health record, independently interpreting results and communicating results to the patient, and care coordination.   Johnston Police, PA-C Department of Hematology/Oncology [facility]   "

## 2024-05-26 NOTE — Telephone Encounter (Signed)
 Oral Oncology Patient Advocate Encounter   Submitted application for assistance for Sprycel  to [employer] Patient [employer].   Application submitted via e-fax to [phone]   [facility] phone number [phone].   I will continue to check the status until final determination.   Lauriana Denes ([name]) [name], CPhT  [facility], [name], [facility] - Oral Chemotherapy Patient Advocate Specialist III Phone: [phone]  Fax: [phone]

## 2024-05-27 ENCOUNTER — Telehealth: Payer: Self-pay | Admitting: Pharmacy Technician

## 2024-05-27 ENCOUNTER — Other Ambulatory Visit (HOSPITAL_COMMUNITY): Payer: Self-pay

## 2024-05-27 NOTE — Telephone Encounter (Signed)
 Oral Oncology Patient Advocate Encounter   Re-authorization   Received notification that prior authorization for dasatinib  is required.   PA submitted on cmm via Latent Key [id number] Status is pending     Janisha Bueso ([name]) [name], CPhT  [facility], [name], [facility] - Oral Chemotherapy Patient Advocate Specialist III Phone: [phone]  Fax: [phone]

## 2024-05-27 NOTE — Telephone Encounter (Signed)
 Oral Oncology Patient Advocate Encounter   New authorization   Prior authorization required for brand in order for patient to qualify for PAP   Received notification that prior authorization for brand Sprycel  is required.   PA submitted on CMM via Latent Key [id number] Status is pending     [name] ([name]) [name], CPhT  [facility], [name], [facility] - Oral Chemotherapy Patient Advocate Specialist III Phone: [phone]  Fax: [phone]

## 2024-05-27 NOTE — Telephone Encounter (Signed)
 Oral Oncology Patient Advocate Encounter  Prior Authorization for dasatinib  has been approved.    PA# [id number] Effective dates: 05/27/2024 through 05/27/2025  Patients co-pay is $982.95.    Anu Stagner ([name]) [name], CPhT  [facility], [name], [facility] - Oral Chemotherapy Patient Advocate Specialist III Phone: [phone]  Fax: [phone]

## 2024-05-28 ENCOUNTER — Other Ambulatory Visit (HOSPITAL_COMMUNITY): Payer: Self-pay

## 2024-05-28 NOTE — Telephone Encounter (Signed)
 Oral Oncology Patient Advocate Encounter  Prior Authorization for brand Sprycel  has been approved.    PA# [id number] Effective dates: 05/27/2024 through 05/06/2025  Patients co-pay is $1,295.03.    Barbette ([name]) [name], CPhT  [facility], [name], [facility] - Oral Chemotherapy Patient Advocate Specialist III Phone: [phone]  Fax: [phone]

## 2024-05-28 NOTE — Telephone Encounter (Signed)
 Oral Oncology Patient Advocate Encounter  [employer] requires that a prior authorization be submitted for the brand name Sprycel .  I have submitted a prior authorization for the brand name and it has been approved.  PA information as well as co-pay amount for the brand name has been e-faxed to [employer] Patient [employer].   [employer] phone number [phone]  [employer] fax number [phone]    I will continue to check the status until final determination.  Miguelina Fore ([name]) [name], CPhT  [facility], [name], [facility] - Oral Chemotherapy Patient Advocate Specialist III Phone: [phone]  Fax: [phone]

## 2024-05-29 ENCOUNTER — Other Ambulatory Visit (HOSPITAL_COMMUNITY): Payer: Self-pay

## 2024-06-01 ENCOUNTER — Other Ambulatory Visit: Payer: Self-pay | Admitting: Pharmacy Technician

## 2024-06-01 ENCOUNTER — Other Ambulatory Visit: Payer: Self-pay

## 2024-06-01 NOTE — Progress Notes (Signed)
 Oral Oncology Patient Advocate Encounter  Patient pending PAP. Disenrolling.  [name] ([name]) [name], CPhT  [facility], [name], [facility] - Oral Chemotherapy Patient Advocate Specialist III Phone: [phone]  Fax: [phone]

## 2024-06-04 ENCOUNTER — Other Ambulatory Visit (HOSPITAL_COMMUNITY): Payer: Self-pay

## 2024-06-04 NOTE — Telephone Encounter (Signed)
 Oral Oncology Patient Advocate Encounter  [name] Patient Assistance [name] is considering patient as a oncologist.  Renewal application has been completed and e-faxed.  [name] Patient [employer] phone number: [phone] [name] Patient Assistance [employer] fax number: [phone]  I will continue to check the status until final determination.   [name] [name], CPhT  [facility], [name], [facility] - Oral Chemotherapy Patient Advocate Specialist III Phone: [phone]  Fax: [phone]

## 2024-06-05 NOTE — Telephone Encounter (Signed)
 Oral Oncology Patient Advocate Encounter   Received notification that the application for assistance for Sprycel  through [facility] [employer] Patient [facility] has been denied due to documentation of 3% out-of-pocket prescription expenses, based on household income, not met.  Please see indexed document for more detailed information.   [employer] Patient [employer] phone number [phone].  Christia Domke ([name]) [name], CPhT  [facility], [name], [facility] - Oral Chemotherapy Pharmacy Technician III Certified Patient Advocate Phone: [phone]  Fax: [phone]

## 2024-06-10 ENCOUNTER — Other Ambulatory Visit: Payer: Self-pay

## 2024-06-10 ENCOUNTER — Other Ambulatory Visit (HOSPITAL_COMMUNITY): Payer: Self-pay

## 2024-06-12 NOTE — Telephone Encounter (Signed)
 Patient's son called today 06/12/2024 to discuss options for paying for his mother's medication.  They are still trying decide if they want to do the Medicare prescription payment plan or pay the cost upfront.  He plans to follow-up with [name] to let her know what they decided.

## 2024-06-15 ENCOUNTER — Other Ambulatory Visit (HOSPITAL_COMMUNITY)

## 2024-06-15 ENCOUNTER — Ambulatory Visit (HOSPITAL_COMMUNITY)

## 2024-09-11 ENCOUNTER — Inpatient Hospital Stay

## 2024-09-18 ENCOUNTER — Inpatient Hospital Stay: Admitting: Oncology

## 2024-10-19 ENCOUNTER — Ambulatory Visit
